# Patient Record
Sex: Female | Born: 1940 | Race: White | Hispanic: No | Smoking: Former smoker
Health system: Southern US, Community
[De-identification: ages and names within clinical notes are randomized; demographics above are authoritative.]

## PROBLEM LIST (undated history)

## (undated) DIAGNOSIS — E7800 Pure hypercholesterolemia, unspecified: Secondary | ICD-10-CM

## (undated) DIAGNOSIS — R011 Cardiac murmur, unspecified: Secondary | ICD-10-CM

## (undated) DIAGNOSIS — Z972 Presence of dental prosthetic device (complete) (partial): Secondary | ICD-10-CM

## (undated) DIAGNOSIS — F329 Major depressive disorder, single episode, unspecified: Secondary | ICD-10-CM

## (undated) DIAGNOSIS — I10 Essential (primary) hypertension: Secondary | ICD-10-CM

## (undated) DIAGNOSIS — F419 Anxiety disorder, unspecified: Secondary | ICD-10-CM

## (undated) DIAGNOSIS — C801 Malignant (primary) neoplasm, unspecified: Secondary | ICD-10-CM

## (undated) DIAGNOSIS — J189 Pneumonia, unspecified organism: Secondary | ICD-10-CM

## (undated) DIAGNOSIS — M109 Gout, unspecified: Secondary | ICD-10-CM

## (undated) DIAGNOSIS — E119 Type 2 diabetes mellitus without complications: Secondary | ICD-10-CM

## (undated) DIAGNOSIS — F32A Depression, unspecified: Secondary | ICD-10-CM

## (undated) DIAGNOSIS — S82899A Other fracture of unspecified lower leg, initial encounter for closed fracture: Secondary | ICD-10-CM

## (undated) DIAGNOSIS — K219 Gastro-esophageal reflux disease without esophagitis: Secondary | ICD-10-CM

## (undated) DIAGNOSIS — N3946 Mixed incontinence: Secondary | ICD-10-CM

## (undated) DIAGNOSIS — E079 Disorder of thyroid, unspecified: Secondary | ICD-10-CM

## (undated) DIAGNOSIS — M1990 Unspecified osteoarthritis, unspecified site: Secondary | ICD-10-CM

## (undated) HISTORY — DX: Depression, unspecified: F32.A

## (undated) HISTORY — DX: Anxiety disorder, unspecified: F41.9

## (undated) HISTORY — PX: EYE SURGERY: SHX253

## (undated) HISTORY — DX: Unspecified osteoarthritis, unspecified site: M19.90

## (undated) HISTORY — DX: Major depressive disorder, single episode, unspecified: F32.9

## (undated) HISTORY — DX: Mixed incontinence: N39.46

## (undated) HISTORY — DX: Other fracture of unspecified lower leg, initial encounter for closed fracture: S82.899A

## (undated) HISTORY — PX: CHOLECYSTECTOMY: SHX55

## (undated) HISTORY — DX: Cardiac murmur, unspecified: R01.1

## (undated) HISTORY — PX: ABDOMINAL HYSTERECTOMY: SHX81

## (undated) HISTORY — PX: KIDNEY SURGERY: SHX687

---

## 1991-12-28 HISTORY — PX: NEPHRECTOMY: SHX65

## 1991-12-28 HISTORY — PX: APPENDECTOMY: SHX54

## 2006-01-19 ENCOUNTER — Ambulatory Visit: Payer: Self-pay

## 2007-07-27 ENCOUNTER — Ambulatory Visit: Payer: Self-pay | Admitting: Family Medicine

## 2008-08-06 ENCOUNTER — Ambulatory Visit: Payer: Self-pay | Admitting: Family Medicine

## 2009-10-23 ENCOUNTER — Ambulatory Visit: Payer: Self-pay | Admitting: Family Medicine

## 2009-11-17 ENCOUNTER — Ambulatory Visit: Payer: Self-pay | Admitting: Family Medicine

## 2010-12-18 ENCOUNTER — Emergency Department: Payer: Self-pay | Admitting: Emergency Medicine

## 2011-02-10 ENCOUNTER — Ambulatory Visit: Payer: Self-pay | Admitting: Family Medicine

## 2012-01-29 ENCOUNTER — Emergency Department: Payer: Self-pay | Admitting: Unknown Physician Specialty

## 2012-01-29 LAB — COMPREHENSIVE METABOLIC PANEL
Anion Gap: 10 (ref 7–16)
BUN: 18 mg/dL (ref 7–18)
Calcium, Total: 8.3 mg/dL — ABNORMAL LOW (ref 8.5–10.1)
Chloride: 102 mmol/L (ref 98–107)
Co2: 32 mmol/L (ref 21–32)
EGFR (Non-African Amer.): 56 — ABNORMAL LOW
Potassium: 3.6 mmol/L (ref 3.5–5.1)
SGOT(AST): 17 U/L (ref 15–37)
SGPT (ALT): 21 U/L

## 2012-01-29 LAB — CBC
HGB: 14.3 g/dL (ref 12.0–16.0)
MCHC: 33 g/dL (ref 32.0–36.0)
MCV: 90 fL (ref 80–100)
Platelet: 248 10*3/uL (ref 150–440)
RBC: 4.81 10*6/uL (ref 3.80–5.20)
RDW: 13.7 % (ref 11.5–14.5)
WBC: 11.6 10*3/uL — ABNORMAL HIGH (ref 3.6–11.0)

## 2012-01-29 LAB — URIC ACID: Uric Acid: 8.1 mg/dL — ABNORMAL HIGH (ref 2.6–6.0)

## 2012-01-29 LAB — SEDIMENTATION RATE: Erythrocyte Sed Rate: 6 mm/hr (ref 0–30)

## 2012-03-21 ENCOUNTER — Ambulatory Visit: Payer: Self-pay | Admitting: Pediatrics

## 2012-05-25 ENCOUNTER — Emergency Department: Payer: Self-pay | Admitting: Emergency Medicine

## 2012-05-25 LAB — URINALYSIS, COMPLETE
Bilirubin,UR: NEGATIVE
Blood: NEGATIVE
Leukocyte Esterase: NEGATIVE
Nitrite: NEGATIVE
Ph: 5 (ref 4.5–8.0)
RBC,UR: 1 /HPF (ref 0–5)
Specific Gravity: 1.019 (ref 1.003–1.030)
Squamous Epithelial: 4

## 2013-02-18 ENCOUNTER — Ambulatory Visit: Payer: Self-pay | Admitting: Emergency Medicine

## 2013-04-30 ENCOUNTER — Ambulatory Visit: Payer: Self-pay

## 2013-07-02 DIAGNOSIS — I10 Essential (primary) hypertension: Secondary | ICD-10-CM | POA: Insufficient documentation

## 2013-07-02 DIAGNOSIS — I152 Hypertension secondary to endocrine disorders: Secondary | ICD-10-CM | POA: Insufficient documentation

## 2013-08-07 ENCOUNTER — Ambulatory Visit: Payer: Self-pay | Admitting: Family Medicine

## 2013-08-30 DIAGNOSIS — K5901 Slow transit constipation: Secondary | ICD-10-CM | POA: Insufficient documentation

## 2013-08-30 DIAGNOSIS — N816 Rectocele: Secondary | ICD-10-CM | POA: Insufficient documentation

## 2013-10-08 DIAGNOSIS — F3341 Major depressive disorder, recurrent, in partial remission: Secondary | ICD-10-CM | POA: Insufficient documentation

## 2013-12-14 ENCOUNTER — Ambulatory Visit: Payer: Self-pay | Admitting: Medical

## 2013-12-14 LAB — CBC WITH DIFFERENTIAL/PLATELET
Basophil %: 1.1 %
Eosinophil #: 0.2 10*3/uL (ref 0.0–0.7)
Eosinophil %: 2.5 %
HCT: 43.2 % (ref 35.0–47.0)
HGB: 14.3 g/dL (ref 12.0–16.0)
Lymphocyte %: 27 %
MCH: 29.5 pg (ref 26.0–34.0)
MCHC: 33.1 g/dL (ref 32.0–36.0)
MCV: 89 fL (ref 80–100)
Monocyte #: 0.5 x10 3/mm (ref 0.2–0.9)
Monocyte %: 6.8 %
Neutrophil #: 4.2 10*3/uL (ref 1.4–6.5)
Neutrophil %: 62.6 %
RBC: 4.84 10*6/uL (ref 3.80–5.20)
RDW: 14.2 % (ref 11.5–14.5)
WBC: 6.7 10*3/uL (ref 3.6–11.0)

## 2013-12-14 LAB — SEDIMENTATION RATE: Erythrocyte Sed Rate: 5 mm/hr (ref 0–30)

## 2014-02-11 ENCOUNTER — Emergency Department: Payer: Self-pay | Admitting: Emergency Medicine

## 2014-02-11 LAB — BASIC METABOLIC PANEL
ANION GAP: 8 (ref 7–16)
BUN: 16 mg/dL (ref 7–18)
CREATININE: 0.98 mg/dL (ref 0.60–1.30)
Calcium, Total: 8.8 mg/dL (ref 8.5–10.1)
Chloride: 99 mmol/L (ref 98–107)
Co2: 28 mmol/L (ref 21–32)
EGFR (Non-African Amer.): 58 — ABNORMAL LOW
Glucose: 162 mg/dL — ABNORMAL HIGH (ref 65–99)
Osmolality: 275 (ref 275–301)
Potassium: 3.3 mmol/L — ABNORMAL LOW (ref 3.5–5.1)
Sodium: 135 mmol/L — ABNORMAL LOW (ref 136–145)

## 2014-02-11 LAB — TROPONIN I: Troponin-I: <0.02 ng/mL

## 2014-02-11 LAB — CBC WITH DIFFERENTIAL/PLATELET
Basophil #: 0 10*3/uL (ref 0.0–0.1)
Basophil %: 0.4 %
EOS PCT: 0.1 %
Eosinophil #: 0 10*3/uL (ref 0.0–0.7)
HCT: 43 % (ref 35.0–47.0)
HGB: 14.8 g/dL (ref 12.0–16.0)
LYMPHS PCT: 7.2 %
Lymphocyte #: 0.7 10*3/uL — ABNORMAL LOW (ref 1.0–3.6)
MCH: 30.2 pg (ref 26.0–34.0)
MCHC: 34.4 g/dL (ref 32.0–36.0)
MCV: 88 fL (ref 80–100)
Monocyte #: 1 x10 3/mm — ABNORMAL HIGH (ref 0.2–0.9)
Monocyte %: 9.9 %
Neutrophil #: 8.6 10*3/uL — ABNORMAL HIGH (ref 1.4–6.5)
Neutrophil %: 82.4 %
Platelet: 191 10*3/uL (ref 150–440)
RBC: 4.9 10*6/uL (ref 3.80–5.20)
RDW: 14.7 % — AB (ref 11.5–14.5)
WBC: 10.4 10*3/uL (ref 3.6–11.0)

## 2014-02-16 LAB — CULTURE, BLOOD (SINGLE)

## 2014-04-02 DIAGNOSIS — E1142 Type 2 diabetes mellitus with diabetic polyneuropathy: Secondary | ICD-10-CM | POA: Insufficient documentation

## 2014-04-02 HISTORY — DX: Type 2 diabetes mellitus with diabetic polyneuropathy: E11.42

## 2014-04-12 ENCOUNTER — Ambulatory Visit: Payer: Self-pay | Admitting: Family Medicine

## 2014-04-24 ENCOUNTER — Ambulatory Visit: Payer: Self-pay | Admitting: Physician Assistant

## 2014-06-14 ENCOUNTER — Ambulatory Visit: Payer: Self-pay | Admitting: Physician Assistant

## 2014-09-20 DIAGNOSIS — R7303 Prediabetes: Secondary | ICD-10-CM | POA: Insufficient documentation

## 2014-09-20 DIAGNOSIS — E118 Type 2 diabetes mellitus with unspecified complications: Secondary | ICD-10-CM | POA: Insufficient documentation

## 2015-05-02 DIAGNOSIS — K219 Gastro-esophageal reflux disease without esophagitis: Secondary | ICD-10-CM | POA: Insufficient documentation

## 2015-05-02 DIAGNOSIS — Z8639 Personal history of other endocrine, nutritional and metabolic disease: Secondary | ICD-10-CM | POA: Insufficient documentation

## 2015-05-02 DIAGNOSIS — N3941 Urge incontinence: Secondary | ICD-10-CM | POA: Insufficient documentation

## 2015-05-02 DIAGNOSIS — Z8541 Personal history of malignant neoplasm of cervix uteri: Secondary | ICD-10-CM | POA: Insufficient documentation

## 2015-06-27 ENCOUNTER — Ambulatory Visit
Admission: EM | Admit: 2015-06-27 | Discharge: 2015-06-27 | Disposition: A | Discharge status: Home / Self Care | Payer: Medicare PPO | Attending: Internal Medicine | Admitting: Internal Medicine

## 2015-06-27 DIAGNOSIS — E1169 Type 2 diabetes mellitus with other specified complication: Secondary | ICD-10-CM

## 2015-06-27 DIAGNOSIS — L089 Local infection of the skin and subcutaneous tissue, unspecified: Secondary | ICD-10-CM | POA: Diagnosis not present

## 2015-06-27 DIAGNOSIS — E11628 Type 2 diabetes mellitus with other skin complications: Secondary | ICD-10-CM

## 2015-06-27 HISTORY — DX: Gastro-esophageal reflux disease without esophagitis: K21.9

## 2015-06-27 HISTORY — DX: Type 2 diabetes mellitus without complications: E11.9

## 2015-06-27 HISTORY — DX: Pure hypercholesterolemia, unspecified: E78.00

## 2015-06-27 HISTORY — DX: Disorder of thyroid, unspecified: E07.9

## 2015-06-27 HISTORY — DX: Essential (primary) hypertension: I10

## 2015-06-27 HISTORY — DX: Gout, unspecified: M10.9

## 2015-06-27 MED ORDER — SULFAMETHOXAZOLE-TRIMETHOPRIM 800-160 MG PO TABS: 1.0000 | ORAL_TABLET | Freq: Two times a day (BID) | ORAL | Status: AC

## 2015-06-27 NOTE — ED Notes (Signed)
Walking into a building that had a 1 foot drop, on July 22/16. Noted a small red area on top of left foot and since then has turned red with a small area of cellulitis. Denies pain.

## 2015-06-27 NOTE — Discharge Instructions (Signed)
Prescription for sulfamethoxazole/trimethoprim sent to the [facility] in [place]. Continue washing gently with soap/water 1-2 times daily,  then cover with a thin smear of antibiotic ointment/bandaid.   Recheck or followup with Dr Iona Beard in a week.  Cellulitis Cellulitis is an infection of the skin and the tissue under the skin. The infected area is usually red and tender. This happens most often in the arms and lower legs. HOME CARE   Take your antibiotic medicine as told. Finish the medicine even if you start to feel better.  Keep the infected arm or leg raised (elevated).  Put a warm cloth on the area up to 4 times per day.  Only take medicines as told by your doctor.  Keep all doctor visits as told. GET HELP IF:  You see red streaks on the skin coming from the infected area.  Your red area gets bigger or turns a dark color.  Your bone or joint under the infected area is painful after the skin heals.  Your infection comes back in the same area or different area.  You have a puffy (swollen) bump in the infected area.  You have new symptoms.  You have a fever. GET HELP RIGHT AWAY IF:   You feel very sleepy.  You throw up (vomit) or have watery poop (diarrhea).  You feel sick and have muscle aches and pains. MAKE SURE YOU:   Understand these instructions.  Will watch your condition.  Will get help right away if you are not doing well or get worse. Document Released: 05/31/2008 Document Revised: 04/29/2014 Document Reviewed: 02/28/2012 [facility] Patient Information 2015 [place], [place]. This information is not intended to replace advice given to you by your health care provider. Make sure you discuss any questions you have with your health care provider.

## 2015-06-27 NOTE — ED Provider Notes (Addendum)
CSN: [id number]     Arrival date & time 06/27/15  0749 History   First MD Initiated Contact with Patient 06/27/15 0831     Chief Complaint  Patient presents with  . Foot Injury   HPI  Patient is a 74 year old lady with past medical history notable for diabetes for about the last 7 years. About a week ago, she misjudged a step going into a building, and twisted her left foot. Her foot was in a tennis shoe, but the dorsal surface was slightly abraded. She has been washing it and applying Neosporin ointment, but noticed today that there was increasing redness around the scraped area. She has had no difficulty with ambulation. No other injuries reported. No fever, no malaise.  Past Medical History  Diagnosis Date  . Diabetes mellitus without complication   . Gout   . GERD (gastroesophageal reflux disease)   . Hypertension   . Hypercholesteremia   . Thyroid condition    Past Surgical History  Procedure Laterality Date  . Cholecystectomy    . Abdominal hysterectomy     Family History  Problem Relation Age of Onset  . Heart disease Mother   . Heart disease Father   . Diabetes Father   . Hypertension Father   . Cancer Brother    History  Substance Use Topics  . Smoking status: Former Research scientist (life sciences)  . Smokeless tobacco: Not on file  . Alcohol Use: No   OB History    No data available     Review of Systems  All other systems reviewed and are negative.   Allergies  Review of patient's allergies indicates no known allergies.  Home Medications   Prior to Admission medications   Medication Sig Start Date End Date Taking? Authorizing Provider  allopurinol (ZYLOPRIM) 300 MG tablet Take 300 mg by mouth daily.   Yes Historical Provider, MD  aspirin EC 81 MG tablet Take 81 mg by mouth daily.   Yes Historical Provider, MD  calcium-vitamin D (OSCAL WITH D) 500-200 MG-UNIT per tablet Take 1 tablet by mouth.   Yes Historical Provider, MD  glimepiride (AMARYL) 2 MG tablet Take 2 mg by mouth  daily with breakfast.   Yes Historical Provider, MD  metFORMIN (GLUCOPHAGE) 500 MG tablet Take by mouth 2 (two) times daily with a meal.   Yes Historical Provider, MD  omeprazole (PRILOSEC) 40 MG capsule Take 40 mg by mouth daily.   Yes Historical Provider, MD  simvastatin (ZOCOR) 40 MG tablet Take 40 mg by mouth daily.   Yes Historical Provider, MD  traZODone (DESYREL) 50 MG tablet Take 50 mg by mouth at bedtime.   Yes Historical Provider, MD  triamterene-hydrochlorothiazide (MAXZIDE-25) 37.5-25 MG per tablet Take 1 tablet by mouth daily.   Yes Historical Provider, MD          BP 178/65 mmHg  Pulse 73  Temp(Src) 97.7 F (36.5 C) (Tympanic)  Resp 16  Ht 5' (1.524 m)  Wt 125 lb (56.7 kg)  BMI 24.41 kg/m2  SpO2 96% Physical Exam  Constitutional: She is oriented to person, place, and time. No distress.  Alert, nicely groomed  HENT:  Head: Atraumatic.  Eyes:  Conjugate gaze, no eye redness/drainage  Neck: Neck supple.  Cardiovascular: Normal rate.   Pulmonary/Chest: No respiratory distress.  Abdominal: She exhibits no distension.  Musculoskeletal: Normal range of motion.  No leg swelling  Neurological: She is alert and oriented to person, place, and time.  Skin: Skin is warm and  dry.  No cyanosis Dorsal left foot has a 1.5 inch focal zone of deep erythema, slight puffiness of the entire dorsum of the foot. There is a 0.75 inch soft scab centrally. No fluctuance, not able to express any material from the wound. Mildly tender.  Nursing note and vitals reviewed.   ED Course  Procedures  No procedure at the urgent care today  MDM   1. Diabetic infection of left foot    Continue washing the wound gently with soap and water once or twice a day and applying antibiotic ointment and a Band-Aid. Scription for Bactrim sent to the pharmacy; in a couple of days if there is not improvement in the redness and puffiness of the foot would add Keflex or something similar for strep coverage.  Anticipate gradual improvement over the next couple weeks, beginning within the next 48 hours. Recheck for increasing redness/swelling/pain/drainage, or new fever greater than 100.5. Have wound checked in a week or so, either at the urgent care or with PCP Dr. Iona Beard.    Sherlene Shams, MD 06/27/15 [place], MD 06/27/15 [phone]

## 2015-09-23 DIAGNOSIS — R809 Proteinuria, unspecified: Secondary | ICD-10-CM | POA: Insufficient documentation

## 2016-01-15 DIAGNOSIS — I10 Essential (primary) hypertension: Secondary | ICD-10-CM | POA: Diagnosis not present

## 2016-01-15 DIAGNOSIS — E559 Vitamin D deficiency, unspecified: Secondary | ICD-10-CM | POA: Insufficient documentation

## 2016-01-15 DIAGNOSIS — E782 Mixed hyperlipidemia: Secondary | ICD-10-CM | POA: Diagnosis not present

## 2016-01-15 DIAGNOSIS — F5101 Primary insomnia: Secondary | ICD-10-CM | POA: Insufficient documentation

## 2016-01-15 DIAGNOSIS — K219 Gastro-esophageal reflux disease without esophagitis: Secondary | ICD-10-CM | POA: Diagnosis not present

## 2016-01-15 DIAGNOSIS — Z79899 Other long term (current) drug therapy: Secondary | ICD-10-CM | POA: Insufficient documentation

## 2016-01-15 DIAGNOSIS — E1142 Type 2 diabetes mellitus with diabetic polyneuropathy: Secondary | ICD-10-CM | POA: Diagnosis not present

## 2016-01-15 DIAGNOSIS — M1A9XX0 Chronic gout, unspecified, without tophus (tophi): Secondary | ICD-10-CM | POA: Diagnosis not present

## 2016-01-15 DIAGNOSIS — N3941 Urge incontinence: Secondary | ICD-10-CM | POA: Diagnosis not present

## 2016-02-03 DIAGNOSIS — E538 Deficiency of other specified B group vitamins: Secondary | ICD-10-CM | POA: Insufficient documentation

## 2016-02-25 DIAGNOSIS — E119 Type 2 diabetes mellitus without complications: Secondary | ICD-10-CM | POA: Diagnosis not present

## 2016-03-15 ENCOUNTER — Other Ambulatory Visit: Payer: Self-pay | Admitting: Internal Medicine

## 2016-03-15 DIAGNOSIS — E1142 Type 2 diabetes mellitus with diabetic polyneuropathy: Secondary | ICD-10-CM | POA: Diagnosis not present

## 2016-03-15 DIAGNOSIS — Z1239 Encounter for other screening for malignant neoplasm of breast: Secondary | ICD-10-CM | POA: Diagnosis not present

## 2016-03-15 DIAGNOSIS — M1A00X0 Idiopathic chronic gout, unspecified site, without tophus (tophi): Secondary | ICD-10-CM | POA: Diagnosis not present

## 2016-03-15 DIAGNOSIS — G63 Polyneuropathy in diseases classified elsewhere: Secondary | ICD-10-CM | POA: Diagnosis not present

## 2016-03-15 DIAGNOSIS — I10 Essential (primary) hypertension: Secondary | ICD-10-CM | POA: Diagnosis not present

## 2016-03-15 DIAGNOSIS — Z1231 Encounter for screening mammogram for malignant neoplasm of breast: Secondary | ICD-10-CM | POA: Diagnosis not present

## 2016-03-15 DIAGNOSIS — E559 Vitamin D deficiency, unspecified: Secondary | ICD-10-CM | POA: Diagnosis not present

## 2016-03-15 DIAGNOSIS — E538 Deficiency of other specified B group vitamins: Secondary | ICD-10-CM | POA: Diagnosis not present

## 2016-03-15 DIAGNOSIS — N3941 Urge incontinence: Secondary | ICD-10-CM | POA: Diagnosis not present

## 2016-03-15 DIAGNOSIS — F5101 Primary insomnia: Secondary | ICD-10-CM | POA: Diagnosis not present

## 2016-03-15 DIAGNOSIS — R270 Ataxia, unspecified: Secondary | ICD-10-CM | POA: Diagnosis not present

## 2016-03-15 DIAGNOSIS — E782 Mixed hyperlipidemia: Secondary | ICD-10-CM | POA: Diagnosis not present

## 2016-03-16 ENCOUNTER — Ambulatory Visit
Admission: RE | Admit: 2016-03-16 | Discharge: 2016-03-16 | Disposition: A | Discharge status: Home / Self Care | Payer: PPO | Source: Ambulatory Visit | Attending: Internal Medicine | Admitting: Internal Medicine

## 2016-03-16 DIAGNOSIS — Z1231 Encounter for screening mammogram for malignant neoplasm of breast: Secondary | ICD-10-CM | POA: Diagnosis not present

## 2016-03-16 HISTORY — DX: Malignant (primary) neoplasm, unspecified: C80.1

## 2016-03-23 ENCOUNTER — Ambulatory Visit (INDEPENDENT_AMBULATORY_CARE_PROVIDER_SITE_OTHER): Payer: PPO | Admitting: Urology

## 2016-03-23 ENCOUNTER — Encounter: Payer: Self-pay | Admitting: Urology

## 2016-03-23 VITALS — BP 168/71 | HR 64 | Ht 60.0 in | Wt 134.7 lb

## 2016-03-23 DIAGNOSIS — R32 Unspecified urinary incontinence: Secondary | ICD-10-CM

## 2016-03-23 DIAGNOSIS — N3946 Mixed incontinence: Secondary | ICD-10-CM

## 2016-03-23 LAB — URINALYSIS, COMPLETE
BILIRUBIN UA: NEGATIVE
Glucose, UA: NEGATIVE
Ketones, UA: NEGATIVE
LEUKOCYTES UA: NEGATIVE
Nitrite, UA: POSITIVE — AB
PH UA: 6 (ref 5.0–7.5)
PROTEIN UA: NEGATIVE
RBC, UA: NEGATIVE
Specific Gravity, UA: 1.025 (ref 1.005–1.030)
Urobilinogen, Ur: 1 mg/dL (ref 0.2–1.0)

## 2016-03-23 LAB — MICROSCOPIC EXAMINATION: RBC MICROSCOPIC, UA: NONE SEEN /HPF (ref 0–?)

## 2016-03-23 LAB — BLADDER SCAN AMB NON-IMAGING: Scan Result: 0

## 2016-03-23 NOTE — Progress Notes (Signed)
03/23/2016 12:22 PM   Candice Cook 06-Apr-1941 [id number]  Referring provider: Sharyne Peach, MD [name], [name] [address]  Chief Complaint  Patient presents with  . Urinary Incontinence    referred by Dorthula Perfect    HPI: Patient is a 75 year old Caucasian female who presents today at the request of her primary care physician, Dr. Dorthula Perfect,  for urinary incontinence   Patient was given a trial of Myrbetriq 25 mg daily. She is on the medication for 2 weeks.  She states that she noted significant improvement in her incontinence, but then she started experiencing uncontrollable tremors and had to discontinue the medication.  She states she has been suffering with incontinence for longer than 1 year.  She endorses symptoms of both stress urinary incontinence and urge incontinence.   She wears 3 incontinence pads daily. She states she has a variable leak volume.  He also experiences nocturia 1, but she does not suffer from nighttime incontinence.  She does not suffer with constipation, but she states that she has stools more on the loose side.    She denies any gross hematuria or history of urinary tract infections.  Her UA today was nitrate positive and had many bacteria.  Her PVR was 0 mL on today's exam.     PMH: Past Medical History  Diagnosis Date  . Diabetes mellitus without complication ([place])   . Gout   . GERD (gastroesophageal reflux disease)   . Hypertension   . Hypercholesteremia   . Thyroid condition   . Cancer ([place])     skin ca  . Anxiety and depression   . Arthritis   . Heart murmur     Surgical History: Past Surgical History  Procedure Laterality Date  . Cholecystectomy    . Abdominal hysterectomy    . Kidney surgery      gave daughter a kidney    Home Medications:    Medication List       This list is accurate as of: 03/23/16 12:22 PM.  Always use your most recent med list.               alendronate 70 MG tablet  Commonly known  as:  FOSAMAX  Take 70 mg by mouth. Reported on 03/23/2016     allopurinol 300 MG tablet  Commonly known as:  ZYLOPRIM  Take 300 mg by mouth daily.     aspirin EC 81 MG tablet  Take 81 mg by mouth daily.     calcium-vitamin D 500-200 MG-UNIT tablet  Commonly known as:  OSCAL WITH D  Take 1 tablet by mouth.     FIFTY50 GLUCOSE METER 2.0 w/Device Kit     gabapentin 300 MG capsule  Commonly known as:  NEURONTIN  Take by mouth.     glimepiride 2 MG tablet  Commonly known as:  AMARYL  Take 2 mg by mouth daily with breakfast.     indomethacin 25 MG capsule  Commonly known as:  INDOCIN  Take 25 mg by mouth. Reported on 03/23/2016     levothyroxine 75 MCG tablet  Commonly known as:  SYNTHROID, LEVOTHROID  Take by mouth. Reported on 03/23/2016     lisinopril-hydrochlorothiazide 20-12.5 MG tablet  Commonly known as:  PRINZIDE,ZESTORETIC  Take by mouth.     metFORMIN 500 MG tablet  Commonly known as:  GLUCOPHAGE  Take by mouth 2 (two) times daily with a meal. Reported on 03/23/2016  MYRBETRIQ 25 MG Tb24 tablet  Generic drug:  mirabegron ER  Reported on 03/23/2016     omeprazole 40 MG capsule  Commonly known as:  PRILOSEC  Take 40 mg by mouth daily.     ONE TOUCH ULTRA TEST test strip  Generic drug:  glucose blood  USE ONCE DAILY. AS INSTRUCTED.     onetouch ultrasoft lancets  USE 1 EACH ONCE DAILY. USE AS INSTRUCTED. ONE TOUCH LANCETS     PREMARIN vaginal cream  Generic drug:  conjugated estrogens     sertraline 50 MG tablet  Commonly known as:  ZOLOFT  Take 50 mg by mouth.     simvastatin 40 MG tablet  Commonly known as:  ZOCOR  Take 40 mg by mouth daily.     traZODone 50 MG tablet  Commonly known as:  DESYREL  Take 100 mg by mouth at bedtime.     triamterene-hydrochlorothiazide 37.5-25 MG tablet  Commonly known as:  MAXZIDE-25  Take 1 tablet by mouth daily. Reported on 03/23/2016        Allergies:  Allergies  Allergen Reactions  . Mirabegron Other  (See Comments)    Severe tremor, headache  . Metformin Diarrhea    Family History: Family History  Problem Relation Age of Onset  . Heart disease Mother   . Heart disease Father   . Diabetes Father   . Hypertension Father   . Cancer Brother   . Kidney disease Daughter   . Bladder Cancer Neg Hx     Social History:  reports that she has quit smoking. She does not have any smokeless tobacco history on file. She reports that she does not drink alcohol or use illicit drugs.  ROS: UROLOGY Frequent Urination?: Yes Hard to postpone urination?: Yes Burning/pain with urination?: No Get up at night to urinate?: Yes Leakage of urine?: Yes Urine stream starts and stops?: No Trouble starting stream?: No Do you have to strain to urinate?: No Blood in urine?: No Urinary tract infection?: No Sexually transmitted disease?: No Injury to kidneys or bladder?: No Painful intercourse?: No Weak stream?: No Currently pregnant?: No Vaginal bleeding?: No Last menstrual period?: n  Gastrointestinal Nausea?: No Vomiting?: No Indigestion/heartburn?: No Diarrhea?: No Constipation?: No  Constitutional Fever: No Night sweats?: No Weight loss?: No Fatigue?: No  Skin Skin rash/lesions?: No Itching?: No  Eyes Blurred vision?: No Double vision?: No  Ears/Nose/Throat Sore throat?: No Sinus problems?: No  Hematologic/Lymphatic Swollen glands?: No Easy bruising?: Yes  Cardiovascular Leg swelling?: No Chest pain?: No  Respiratory Cough?: No Shortness of breath?: No  Endocrine Excessive thirst?: Yes  Musculoskeletal Back pain?: No Joint pain?: Yes  Neurological Headaches?: No Dizziness?: No  Psychologic Depression?: Yes Anxiety?: Yes  Physical Exam: BP 168/71 mmHg  Pulse 64  Ht 5' (1.524 m)  Wt 134 lb 11.2 oz (61.1 kg)  BMI 26.31 kg/m2  Constitutional: Well nourished. Alert and oriented, No acute distress. HEENT: [place] AT, moist mucus membranes. Trachea midline,  no masses. Cardiovascular: No clubbing, cyanosis, or edema. Respiratory: Normal respiratory effort, no increased work of breathing. GI: Abdomen is soft, non tender, non distended, no abdominal masses. Liver and spleen not palpable.  No hernias appreciated.  Stool sample for occult testing is not indicated.   GU: No CVA tenderness.  No bladder fullness or masses.   Skin: No rashes, bruises or suspicious lesions. Lymph: No cervical or inguinal adenopathy. Neurologic: Grossly intact, no focal deficits, moving all 4 extremities. Psychiatric: Normal mood and  affect.  Laboratory Data: Lab Results  Component Value Date   WBC 10.4 02/11/2014   HGB 14.8 02/11/2014   HCT 43.0 02/11/2014   MCV 88 02/11/2014   PLT 191 02/11/2014    Lab Results  Component Value Date   CREATININE 0.98 02/11/2014    Lab Results  Component Value Date   AST 17 01/29/2012   Lab Results  Component Value Date   ALT 21 01/29/2012     Urinalysis Results for orders placed or performed in visit on 03/23/16  Microscopic Examination  Result Value Ref Range   WBC, UA 0-5 0 -  5 /hpf   RBC, UA None seen 0 -  2 /hpf   Epithelial Cells (non renal) 0-10 0 - 10 /hpf   Bacteria, UA Many (A) None seen/Few  Urinalysis, Complete  Result Value Ref Range   Specific Gravity, UA 1.025 1.005 - 1.030   pH, UA 6.0 5.0 - 7.5   Color, UA Yellow Yellow   Appearance Ur Clear Clear   Leukocytes, UA Negative Negative   Protein, UA Negative Negative/Trace   Glucose, UA Negative Negative   Ketones, UA Negative Negative   RBC, UA Negative Negative   Bilirubin, UA Negative Negative   Urobilinogen, Ur 1.0 0.2 - 1.0 mg/dL   Nitrite, UA Positive (A) Negative   Microscopic Examination See below:   BLADDER SCAN AMB NON-IMAGING  Result Value Ref Range   Scan Result 0     Pertinent Imaging: Results for UGOCHI, HENZLER (MRN [id number]) as of 03/23/2016 12:17  Ref. Range 03/23/2016 09:12  Scan Result Unknown 0    Assessment &  Plan:    1. Mixed urinary incontinence:   Patient has symptoms of both stress and urge incontinence.  She did respond well to Myrbetriq, but she experienced severe side effects and had to stop the medication.   We discussed trying an anticholinergic or PTNS therapy to see if she could have reduction in her urinary incontinence.  She would like to try another medication.  I have given her Vesicare 10 mg daily.   I have advised her of the side effects of Vesicare, such as: Dry eyes, dry mouth, constipation, mental confusion and/or urinary retention.  She will RTC in one month for PVR and symptoms recheck.  - Urinalysis, Complete - BLADDER SCAN AMB NON-IMAGING   Return in about 1 month (around 04/23/2016) for PVR and UA.  These notes generated with voice recognition software. I apologize for typographical errors.  Zara Council, [facility] [address] [place], [place] [address] [phone]

## 2016-03-26 ENCOUNTER — Telehealth: Payer: Self-pay

## 2016-03-26 DIAGNOSIS — N390 Urinary tract infection, site not specified: Secondary | ICD-10-CM

## 2016-03-26 LAB — CULTURE, URINE COMPREHENSIVE

## 2016-03-26 MED ORDER — AMOXICILLIN-POT CLAVULANATE 875-125 MG PO TABS: 1.0000 | ORAL_TABLET | Freq: Two times a day (BID) | ORAL | Status: AC

## 2016-03-26 NOTE — Telephone Encounter (Signed)
Spoke with pt in reference to UTI. Made aware medication was sent to pharmacy. Pt voiced understanding.

## 2016-03-26 NOTE — Telephone Encounter (Signed)
-----   Message from Nori Riis, PA-C sent at 03/26/2016  8:24 AM EDT ----- Patient has a +UCx.  They need to start Augmentin 875/125 one tablet twice daily for seven days.  She is returning for an appointment and we will recheck the urine then.

## 2016-04-15 DIAGNOSIS — Z79899 Other long term (current) drug therapy: Secondary | ICD-10-CM | POA: Diagnosis not present

## 2016-04-15 DIAGNOSIS — I10 Essential (primary) hypertension: Secondary | ICD-10-CM | POA: Diagnosis not present

## 2016-04-15 DIAGNOSIS — E559 Vitamin D deficiency, unspecified: Secondary | ICD-10-CM | POA: Diagnosis not present

## 2016-04-15 DIAGNOSIS — E782 Mixed hyperlipidemia: Secondary | ICD-10-CM | POA: Diagnosis not present

## 2016-04-15 DIAGNOSIS — E538 Deficiency of other specified B group vitamins: Secondary | ICD-10-CM | POA: Diagnosis not present

## 2016-04-15 DIAGNOSIS — M1A00X0 Idiopathic chronic gout, unspecified site, without tophus (tophi): Secondary | ICD-10-CM | POA: Diagnosis not present

## 2016-04-15 DIAGNOSIS — E1142 Type 2 diabetes mellitus with diabetic polyneuropathy: Secondary | ICD-10-CM | POA: Diagnosis not present

## 2016-04-23 ENCOUNTER — Encounter: Payer: Self-pay | Admitting: Urology

## 2016-04-23 ENCOUNTER — Ambulatory Visit (INDEPENDENT_AMBULATORY_CARE_PROVIDER_SITE_OTHER): Payer: PPO | Admitting: Urology

## 2016-04-23 VITALS — BP 127/61 | HR 72 | Ht 61.0 in | Wt 130.5 lb

## 2016-04-23 DIAGNOSIS — N3946 Mixed incontinence: Secondary | ICD-10-CM | POA: Diagnosis not present

## 2016-04-23 LAB — BLADDER SCAN AMB NON-IMAGING: Scan Result: 51

## 2016-04-23 NOTE — Progress Notes (Addendum)
10:24 AM   Candice Cook December 07, 1941 [id number]  Referring provider: Ezequiel Kayser, MD [facility] [place], [place] [address]  Chief Complaint  Patient presents with  . Mixed Incontinnece    1 month follow up    HPI: Patient is a 75 year old Caucasian female who presents today after a 1 month trial of Vesicare 10 mg daily.  Background history Patient presented to [place] at the request of her primary care physician, Dr. Dorthula Perfect,  for urinary incontinence.  Patient was given a trial of Myrbetriq 25 mg daily. She is on the medication for 2 weeks.  She states that she noted significant improvement in her incontinence, but then she started experiencing uncontrollable tremors and had to discontinue the medication.  She states she has been suffering with incontinence for longer than 1 year.  She endorses symptoms of both stress urinary incontinence and urge incontinence.   She wears 3 incontinence pads daily. She states she has a variable leak volume.  She also experiences nocturia 1, but she does not suffer from nighttime incontinence.  She does not suffer with constipation, but she states that she has stools more on the loose side.  She denies any gross hematuria or history of urinary tract infections.  She was found to have a urinary tract infection at her last visit.  It was positive for Escherichia coli and was sensitive to Augmentin. She completed a 7 day course of Augmentin.    She did not note any improvement with the incontinence after completing the antibiotic or with the Vesicare.  Her PVR today is 51 mL.    She is not experiencing dysuria, gross hematuria or suprapubic pain. She denies fevers, chills, nausea or vomiting.     PMH: Past Medical History  Diagnosis Date  . Diabetes mellitus without complication ([place])   . Gout   . GERD (gastroesophageal reflux disease)   . Hypertension   . Hypercholesteremia   . Thyroid condition   . Cancer ([place])    skin ca  . Anxiety and depression   . Arthritis   . Heart murmur   . Mixed incontinence     Surgical History: Past Surgical History  Procedure Laterality Date  . Cholecystectomy    . Abdominal hysterectomy    . Kidney surgery      gave daughter a kidney    Home Medications:    Medication List       This list is accurate as of: 04/23/16 10:24 AM.  Always use your most recent med list.               alendronate 70 MG tablet  Commonly known as:  FOSAMAX  Take 70 mg by mouth. Reported on 04/23/2016     allopurinol 300 MG tablet  Commonly known as:  ZYLOPRIM  Take 300 mg by mouth daily.     aspirin EC 81 MG tablet  Take 81 mg by mouth daily.     calcium-vitamin D 500-200 MG-UNIT tablet  Commonly known as:  OSCAL WITH D  Take 1 tablet by mouth. Reported on 04/23/2016     FIFTY50 GLUCOSE METER 2.0 w/Device Kit     gabapentin 300 MG capsule  Commonly known as:  NEURONTIN  Take by mouth.     glimepiride 2 MG tablet  Commonly known as:  AMARYL  Take 2 mg by mouth daily with breakfast.     indomethacin 25 MG capsule  Commonly known  as:  INDOCIN  Take 25 mg by mouth. Reported on 04/23/2016     levothyroxine 75 MCG tablet  Commonly known as:  SYNTHROID, LEVOTHROID  Take by mouth. Reported on 04/23/2016     lisinopril-hydrochlorothiazide 20-12.5 MG tablet  Commonly known as:  PRINZIDE,ZESTORETIC  Take by mouth.     metFORMIN 500 MG tablet  Commonly known as:  GLUCOPHAGE  Take by mouth 2 (two) times daily with a meal. Reported on 04/23/2016     MYRBETRIQ 25 MG Tb24 tablet  Generic drug:  mirabegron ER  Reported on 04/23/2016     omeprazole 40 MG capsule  Commonly known as:  PRILOSEC  Take 40 mg by mouth daily.     ONE TOUCH ULTRA TEST test strip  Generic drug:  glucose blood  USE ONCE DAILY. AS INSTRUCTED.     onetouch ultrasoft lancets  USE 1 EACH ONCE DAILY. USE AS INSTRUCTED. ONE TOUCH LANCETS     PREMARIN vaginal cream  Generic drug:  conjugated  estrogens     sertraline 50 MG tablet  Commonly known as:  ZOLOFT  Take 50 mg by mouth.     simvastatin 40 MG tablet  Commonly known as:  ZOCOR  Take 40 mg by mouth daily.     solifenacin 10 MG tablet  Commonly known as:  VESICARE  Take by mouth.     traZODone 50 MG tablet  Commonly known as:  DESYREL  Take 100 mg by mouth at bedtime.     triamterene-hydrochlorothiazide 37.5-25 MG tablet  Commonly known as:  MAXZIDE-25  Take 1 tablet by mouth daily. Reported on 04/23/2016     VITAMIN D-1000 MAX ST 1000 units tablet  Generic drug:  Cholecalciferol  Take by mouth.        Allergies:  Allergies  Allergen Reactions  . Mirabegron Other (See Comments)    Severe tremor, headache  . Metformin Diarrhea    Family History: Family History  Problem Relation Age of Onset  . Heart disease Mother   . Heart disease Father   . Diabetes Father   . Hypertension Father   . Cancer Brother   . Kidney disease Daughter   . Bladder Cancer Neg Hx     Social History:  reports that she has quit smoking. She does not have any smokeless tobacco history on file. She reports that she does not drink alcohol or use illicit drugs.  ROS: UROLOGY Frequent Urination?: Yes Hard to postpone urination?: Yes Burning/pain with urination?: No Get up at night to urinate?: Yes Leakage of urine?: Yes Urine stream starts and stops?: No Trouble starting stream?: No Do you have to strain to urinate?: No Blood in urine?: No Urinary tract infection?: No Sexually transmitted disease?: No Injury to kidneys or bladder?: No Painful intercourse?: No Weak stream?: No Currently pregnant?: No Vaginal bleeding?: No Last menstrual period?: n  Gastrointestinal Nausea?: No Vomiting?: No Indigestion/heartburn?: No Diarrhea?: No Constipation?: No  Constitutional Fever: No Night sweats?: No Weight loss?: No Fatigue?: No  Skin Skin rash/lesions?: No Itching?: No  Eyes Blurred vision?: No Double  vision?: No  Ears/Nose/Throat Sore throat?: No Sinus problems?: No  Hematologic/Lymphatic Swollen glands?: No Easy bruising?: Yes  Cardiovascular Leg swelling?: No Chest pain?: No  Respiratory Cough?: No Shortness of breath?: No  Endocrine Excessive thirst?: Yes  Musculoskeletal Back pain?: No Joint pain?: Yes  Neurological Headaches?: No Dizziness?: No  Psychologic Depression?: Yes Anxiety?: Yes  Physical Exam: BP 127/61 mmHg  Pulse 72  Ht 5' 1" (1.549 m)  Wt 130 lb 8 oz (59.194 kg)  BMI 24.67 kg/m2  Constitutional: Well nourished. Alert and oriented, No acute distress. HEENT: [place] AT, moist mucus membranes. Trachea midline, no masses. Cardiovascular: No clubbing, cyanosis, or edema. Respiratory: Normal respiratory effort, no increased work of breathing. GI: Abdomen is soft, non tender, non distended, no abdominal masses.  GU: No CVA tenderness.  No bladder fullness or masses.   Skin: No rashes, bruises or suspicious lesions. Lymph: No cervical or inguinal adenopathy. Neurologic: Grossly intact, no focal deficits, moving all 4 extremities. Psychiatric: Normal mood and affect.  Laboratory Data: Lab Results  Component Value Date   WBC 10.4 02/11/2014   HGB 14.8 02/11/2014   HCT 43.0 02/11/2014   MCV 88 02/11/2014   PLT 191 02/11/2014    Lab Results  Component Value Date   CREATININE 0.98 02/11/2014    Lab Results  Component Value Date   AST 17 01/29/2012   Lab Results  Component Value Date   ALT 21 01/29/2012    Pertinent Imaging: Results for Candice Cook, Candice Cook (MRN [id number]) as of 04/29/2016 10:06  Ref. Range 04/23/2016 10:09  Scan Result Unknown 51     Assessment & Plan:    1. Mixed urinary incontinence:   Patient has symptoms of both stress and urge incontinence.  She did respond well to Myrbetriq, but she experienced severe side effects and had to stop the medication.   She did not find the Vesicare effective.   We discussed trying an  anticholinergic or PTNS therapy to see if she could have reduction in her urinary incontinence.  She would like to try another medication.  I have given her Toviaz 31m daily.   I have advised her of the side effects of Toviaz, such as: Dry eyes, dry mouth, constipation, mental confusion and/or urinary retention.  I also gave her the brochure on PTNS.  She will [place] the TL[place]beth Ply[place] effective and she desires a prescription.  She is interested in PTNS, but she would like to wait to schedule the treatments in the winter.    - Urinalysis, Complete - BLADDER SCAN AMB NON-IMAGING   Return in about 1 year (around 04/23/2017) for office visit.  These notes generated with voice recognition software. I apologize for typographical errors.  SZara Council [facility] [address] [place] [phone]

## 2016-05-17 ENCOUNTER — Ambulatory Visit (INDEPENDENT_AMBULATORY_CARE_PROVIDER_SITE_OTHER): Payer: PPO | Admitting: Urology

## 2016-05-17 ENCOUNTER — Encounter: Payer: Self-pay | Admitting: Urology

## 2016-05-17 VITALS — BP 136/66 | HR 85 | Ht 60.0 in | Wt 130.7 lb

## 2016-05-17 DIAGNOSIS — N3946 Mixed incontinence: Secondary | ICD-10-CM | POA: Diagnosis not present

## 2016-05-17 LAB — BLADDER SCAN AMB NON-IMAGING: Scan Result: 0

## 2016-05-17 NOTE — Progress Notes (Signed)
10:43 AM   Candice Cook 1941-11-09 [id number]  Referring provider: Ezequiel Kayser, MD [facility] [place], [place] [address]  Chief Complaint  Patient presents with  . Mixed Incontinence    3 weeks follow up    HPI: Patient is a 75 year old Caucasian female who presents today after a 3 week trial of Toviaz 4 mg daily  Background history Patient presented to [place] at the request of her primary care physician, Dr. Dorthula Perfect,  for urinary incontinence.  Patient was given a trial of Myrbetriq 25 mg daily. She is on the medication for 2 weeks.  She states that she noted significant improvement in her incontinence, but then she started experiencing uncontrollable tremors and had to discontinue the medication.  She states she has been suffering with incontinence for longer than 1 year.  She endorses symptoms of both stress urinary incontinence and urge incontinence.   She wears 3 incontinence pads daily. She states she has a variable leak volume.  She also experiences nocturia 1, but she does not suffer from nighttime incontinence.  She does not suffer with constipation, but she states that she has stools more on the loose side.  She denies any gross hematuria or history of urinary tract infections.  She was found to have a urinary tract infection on 03/23/2016.  It was positive for Escherichia coli and was sensitive to Augmentin.  She completed a 7 day course of Augmentin.  She did not note any improvement with the incontinence after completing the antibiotic or with the Vesicare.  Her PVR today was 51 mL.    After a trial of the Toviaz 4 mg daily, she had extreme dry mouth and discontinued the samples. She did not note any improvement in her urinary symptoms. Her PVR was 0 mL.   She is experiencing urgency and leakage of urine.  She is not experiencing dysuria, gross hematuria or suprapubic pain. She denies fevers, chills, nausea or vomiting.     PMH: Past Medical History   Diagnosis Date  . Diabetes mellitus without complication ([place])   . Gout   . GERD (gastroesophageal reflux disease)   . Hypertension   . Hypercholesteremia   . Thyroid condition   . Cancer ([place])     skin ca  . Anxiety and depression   . Arthritis   . Heart murmur   . Mixed incontinence     Surgical History: Past Surgical History  Procedure Laterality Date  . Cholecystectomy    . Abdominal hysterectomy    . Kidney surgery      gave daughter a kidney    Home Medications:    Medication List       This list is accurate as of: 05/17/16 10:43 AM.  Always use your most recent med list.               alendronate 70 MG tablet  Commonly known as:  FOSAMAX  Take 70 mg by mouth. Reported on 05/17/2016     allopurinol 300 MG tablet  Commonly known as:  ZYLOPRIM  Take 300 mg by mouth daily.     aspirin EC 81 MG tablet  Take 81 mg by mouth daily.     calcium-vitamin D 500-200 MG-UNIT tablet  Commonly known as:  OSCAL WITH D  Take 1 tablet by mouth. Reported on 05/17/2016     FIFTY50 GLUCOSE METER 2.0 w/Device Kit     gabapentin 300 MG capsule  Commonly known as:  NEURONTIN  Take by mouth.     glimepiride 2 MG tablet  Commonly known as:  AMARYL  Take 2 mg by mouth daily with breakfast.     indomethacin 25 MG capsule  Commonly known as:  INDOCIN  Take 25 mg by mouth. Reported on 05/17/2016     levothyroxine 75 MCG tablet  Commonly known as:  SYNTHROID, LEVOTHROID  Take by mouth. Reported on 04/23/2016     lisinopril-hydrochlorothiazide 20-12.5 MG tablet  Commonly known as:  PRINZIDE,ZESTORETIC  Take by mouth.     metFORMIN 500 MG tablet  Commonly known as:  GLUCOPHAGE  Take by mouth 2 (two) times daily with a meal. Reported on 05/17/2016     MYRBETRIQ 25 MG Tb24 tablet  Generic drug:  mirabegron ER  Reported on 05/17/2016     omeprazole 40 MG capsule  Commonly known as:  PRILOSEC  Take 40 mg by mouth daily.     ONE TOUCH ULTRA TEST test strip  Generic  drug:  glucose blood  USE ONCE DAILY. AS INSTRUCTED.     onetouch ultrasoft lancets  USE 1 EACH ONCE DAILY. USE AS INSTRUCTED. ONE TOUCH LANCETS     PREMARIN vaginal cream  Generic drug:  conjugated estrogens  Reported on 05/17/2016     sertraline 50 MG tablet  Commonly known as:  ZOLOFT  Take 50 mg by mouth.     simvastatin 40 MG tablet  Commonly known as:  ZOCOR  Take 40 mg by mouth daily.     solifenacin 10 MG tablet  Commonly known as:  VESICARE  Take by mouth. Reported on 05/17/2016     traZODone 50 MG tablet  Commonly known as:  DESYREL  Take 100 mg by mouth at bedtime.     triamterene-hydrochlorothiazide 37.5-25 MG tablet  Commonly known as:  MAXZIDE-25  Take 1 tablet by mouth daily. Reported on 04/23/2016     VITAMIN D-1000 MAX ST 1000 units tablet  Generic drug:  Cholecalciferol  Take by mouth.        Allergies:  Allergies  Allergen Reactions  . Mirabegron Other (See Comments)    Severe tremor, headache  . Metformin Diarrhea    Family History: Family History  Problem Relation Age of Onset  . Heart disease Mother   . Heart disease Father   . Diabetes Father   . Hypertension Father   . Cancer Brother   . Kidney disease Daughter   . Bladder Cancer Neg Hx     Social History:  reports that she has quit smoking. She does not have any smokeless tobacco history on file. She reports that she does not drink alcohol or use illicit drugs.  ROS: UROLOGY Frequent Urination?: No Hard to postpone urination?: Yes Burning/pain with urination?: No Get up at night to urinate?: No Leakage of urine?: Yes Urine stream starts and stops?: No Trouble starting stream?: No Do you have to strain to urinate?: No Blood in urine?: No Urinary tract infection?: No Sexually transmitted disease?: No Injury to kidneys or bladder?: No Painful intercourse?: No Weak stream?: No Currently pregnant?: No Vaginal bleeding?: No Last menstrual period?:  n  Gastrointestinal Nausea?: No Vomiting?: No Indigestion/heartburn?: No Diarrhea?: No Constipation?: No  Constitutional Fever: No Night sweats?: No Weight loss?: No Fatigue?: No  Skin Skin rash/lesions?: No Itching?: No  Eyes Blurred vision?: No Double vision?: No  Ears/Nose/Throat Sore throat?: No Sinus problems?: Yes  Hematologic/Lymphatic Swollen glands?: No Easy bruising?: Yes  Cardiovascular Leg swelling?: No Chest pain?: No  Respiratory Cough?: No Shortness of breath?: No  Endocrine Excessive thirst?: Yes  Musculoskeletal Back pain?: No Joint pain?: No  Neurological Headaches?: No Dizziness?: No  Psychologic Depression?: Yes Anxiety?: Yes  Physical Exam: BP 136/66 mmHg  Pulse 85  Ht 5' (1.524 m)  Wt 130 lb 11.2 oz (59.285 kg)  BMI 25.53 kg/m2  Constitutional: Well nourished. Alert and oriented, No acute distress. HEENT: [place] AT, moist mucus membranes. Trachea midline, no masses. Cardiovascular: No clubbing, cyanosis, or edema. Respiratory: Normal respiratory effort, no increased work of breathing. GI: Abdomen is soft, non tender, non distended, no abdominal masses.  GU: No CVA tenderness.  No bladder fullness or masses.   Skin: No rashes, bruises or suspicious lesions. Lymph: No cervical or inguinal adenopathy. Neurologic: Grossly intact, no focal deficits, moving all 4 extremities. Psychiatric: Normal mood and affect.  Laboratory Data: Lab Results  Component Value Date   WBC 10.4 02/11/2014   HGB 14.8 02/11/2014   HCT 43.0 02/11/2014   MCV 88 02/11/2014   PLT 191 02/11/2014    Lab Results  Component Value Date   CREATININE 0.98 02/11/2014    Lab Results  Component Value Date   AST 17 01/29/2012   Lab Results  Component Value Date   ALT 21 01/29/2012    Pertinent Imaging: Results for Candice Cook, Candice Cook (MRN [id number]) as of 05/20/2016 19:40  Ref. Range 05/17/2016 10:33  Scan Result Unknown 0    Assessment & Plan:     1. Mixed urinary incontinence:   Patient has symptoms of both stress and urge incontinence.  She did respond well to Myrbetriq, but she experienced severe side effects and had to stop the medication.   She did not find the Vesicare effective.   The Toviaz caused extreme dry mouth.  She is interested in PTNS, but she would like to wait to schedule the treatments in the winter.    - BLADDER SCAN AMB NON-IMAGING   Return for Patient wants to start PTNS therapy in the winter.  These notes generated with voice recognition software. I apologize for typographical errors.  Candice Cook, [facility] [address] [place], [place] [address] [phone]

## 2016-08-10 ENCOUNTER — Ambulatory Visit
Admission: EM | Admit: 2016-08-10 | Discharge: 2016-08-10 | Disposition: A | Discharge status: Home / Self Care | Payer: PPO | Attending: Family Medicine | Admitting: Family Medicine

## 2016-08-10 DIAGNOSIS — N390 Urinary tract infection, site not specified: Secondary | ICD-10-CM

## 2016-08-10 LAB — URINALYSIS COMPLETE WITH MICROSCOPIC (ARMC ONLY)
BILIRUBIN URINE: NEGATIVE
GLUCOSE, UA: NEGATIVE mg/dL
Hgb urine dipstick: NEGATIVE
Ketones, ur: NEGATIVE mg/dL
Nitrite: NEGATIVE
Protein, ur: NEGATIVE mg/dL
RBC / HPF: NONE SEEN RBC/hpf (ref 0–5)
Specific Gravity, Urine: 1.015 (ref 1.005–1.030)
pH: 7 (ref 5.0–8.0)

## 2016-08-10 MED ORDER — AMOXICILLIN-POT CLAVULANATE 875-125 MG PO TABS: 1.0000 | ORAL_TABLET | Freq: Two times a day (BID) | ORAL | 0 refills | Status: DC

## 2016-08-10 NOTE — ED Triage Notes (Signed)
Patient complains of urinary tract symptoms. Patient states that 4 days ago she started having decreased output and urinary pressure, patient states that at times it hurts to urinate.

## 2016-08-10 NOTE — ED Provider Notes (Signed)
[facility]    CSN: [id number] Arrival date & time: 08/10/16  1310  First Provider Contact:  None       History   Chief Complaint Chief Complaint  Patient presents with  . Urinary Tract Infection    HPI Candice Cook is a 75 y.o. female.   The history is provided by the patient.  Urinary Tract Infection  Pain quality:  Aching Pain severity:  Mild Onset quality:  Sudden Duration:  4 days Timing:  Constant Progression:  Worsening Chronicity:  New Recent urinary tract infections: no   Relieved by:  Nothing Urinary symptoms: hesitancy   Associated symptoms: no abdominal pain, no fever, no flank pain, no nausea and no vomiting     Past Medical History:  Diagnosis Date  . Anxiety and depression   . Arthritis   . Cancer ([place])    skin ca  . Diabetes mellitus without complication ([place])   . GERD (gastroesophageal reflux disease)   . Gout   . Heart murmur   . Hypercholesteremia   . Hypertension   . Mixed incontinence   . Thyroid condition     Patient Active Problem List   Diagnosis Date Noted  . Mixed incontinence 03/23/2016    Past Surgical History:  Procedure Laterality Date  . ABDOMINAL HYSTERECTOMY    . CHOLECYSTECTOMY    . KIDNEY SURGERY     gave daughter a kidney    OB History    No data available       Home Medications    Prior to Admission medications   Medication Sig Start Date End Date Taking? Authorizing Provider  alendronate (FOSAMAX) 70 MG tablet Take 70 mg by mouth. Reported on 05/17/2016 02/02/13  Yes Historical Provider, MD  allopurinol (ZYLOPRIM) 300 MG tablet Take 300 mg by mouth daily.   Yes Historical Provider, MD  aspirin EC 81 MG tablet Take 81 mg by mouth daily.   Yes Historical Provider, MD  Blood Glucose Monitoring Suppl (FIFTY50 GLUCOSE METER 2.0) w/Device KIT  02/20/16 02/19/17 Yes Historical Provider, MD  calcium-vitamin D (OSCAL WITH D) 500-200 MG-UNIT per tablet Take 1 tablet by mouth. Reported on 05/17/2016    Yes Historical Provider, MD  Cholecalciferol (VITAMIN D-1000 MAX ST) 1000 units tablet Take by mouth.   Yes Historical Provider, MD  conjugated estrogens (PREMARIN) vaginal cream Reported on 05/17/2016 06/13/12  Yes Historical Provider, MD  gabapentin (NEURONTIN) 300 MG capsule Take by mouth. 03/15/16  Yes Historical Provider, MD  glimepiride (AMARYL) 2 MG tablet Take 2 mg by mouth daily with breakfast.   Yes Historical Provider, MD  indomethacin (INDOCIN) 25 MG capsule Take 25 mg by mouth. Reported on 05/17/2016 03/08/12  Yes Historical Provider, MD  Lancets (ONETOUCH ULTRASOFT) lancets USE 1 EACH ONCE DAILY. USE AS INSTRUCTED. ONE TOUCH LANCETS 02/20/16  Yes Historical Provider, MD  levothyroxine (SYNTHROID, LEVOTHROID) 75 MCG tablet Take by mouth. Reported on 04/23/2016 02/12/13  Yes Historical Provider, MD  lisinopril-hydrochlorothiazide Reita May) 20-12.5 MG tablet Take by mouth. 02/16/16 02/15/17 Yes Historical Provider, MD  metFORMIN (GLUCOPHAGE) 500 MG tablet Take by mouth 2 (two) times daily with a meal. Reported on 05/17/2016   Yes Historical Provider, MD  omeprazole (PRILOSEC) 40 MG capsule Take 40 mg by mouth daily.   Yes Historical Provider, MD  ONE TOUCH ULTRA TEST test strip USE ONCE DAILY. AS INSTRUCTED. 02/20/16  Yes Historical Provider, MD  sertraline (ZOLOFT) 50 MG tablet Take 50 mg by mouth. 02/12/13  Yes Historical Provider, MD  simvastatin (ZOCOR) 40 MG tablet Take 40 mg by mouth daily.   Yes Historical Provider, MD  traZODone (DESYREL) 50 MG tablet Take 100 mg by mouth at bedtime.    Yes Historical Provider, MD  triamterene-hydrochlorothiazide (MAXZIDE-25) 37.5-25 MG per tablet Take 1 tablet by mouth daily. Reported on 04/23/2016   Yes Historical Provider, MD  amoxicillin-clavulanate (AUGMENTIN) 875-125 MG tablet Take 1 tablet by mouth 2 (two) times daily. 08/10/16   Norval Gable, MD  MYRBETRIQ 25 MG TB24 tablet Reported on 05/17/2016 01/15/16   Historical Provider, MD    solifenacin (VESICARE) 10 MG tablet Take by mouth. Reported on 05/17/2016    Historical Provider, MD    Family History Family History  Problem Relation Age of Onset  . Heart disease Mother   . Heart disease Father   . Diabetes Father   . Hypertension Father   . Cancer Brother   . Kidney disease Daughter   . Bladder Cancer Neg Hx     Social History Social History  Substance Use Topics  . Smoking status: Former Research scientist (life sciences)  . Smokeless tobacco: Never Used     Comment: quit 25 years  . Alcohol use No     Allergies   Mirabegron and Metformin   Review of Systems Review of Systems  Constitutional: Negative for fever.  Gastrointestinal: Negative for abdominal pain, nausea and vomiting.  Genitourinary: Negative for flank pain.     Physical Exam Triage Vital Signs ED Triage Vitals  Enc Vitals Group     BP 08/10/16 1326 (!) 183/57     Pulse Rate 08/10/16 1326 (!) 58     Resp 08/10/16 1326 16     Temp 08/10/16 1326 98.2 F (36.8 C)     Temp Source 08/10/16 1326 Oral     SpO2 08/10/16 1326 97 %     Weight 08/10/16 1328 130 lb (59 kg)     Height 08/10/16 1328 5' 1"  (1.549 m)     Head Circumference --      Peak Flow --      Pain Score 08/10/16 1333 0     Pain Loc --      Pain Edu? --      Excl. in [employer]? --    No data found.   Updated Vital Signs BP (!) 183/57 (BP Location: Left Arm)   Pulse (!) 58   Temp 98.2 F (36.8 C) (Oral)   Resp 16   Ht 5' 1"  (1.549 m)   Wt 130 lb (59 kg)   SpO2 97%   BMI 24.56 kg/m   Visual Acuity Right Eye Distance:   Left Eye Distance:   Bilateral Distance:    Right Eye Near:   Left Eye Near:    Bilateral Near:     Physical Exam  Constitutional: She appears well-developed and well-nourished. No distress.  Abdominal: Soft. Bowel sounds are normal. She exhibits no distension and no mass. There is no tenderness. There is no rebound and no guarding.  Skin: She is not diaphoretic.  Nursing note and vitals reviewed.    UC  Treatments / Results  Labs (all labs ordered are listed, but only abnormal results are displayed) Labs Reviewed  URINALYSIS COMPLETEWITH MICROSCOPIC ([facility] ONLY) - Abnormal; Notable for the following:       Result Value   Color, Urine STRAW (*)    APPearance HAZY (*)    Leukocytes, UA SMALL (*)    Bacteria, UA RARE (*)  Squamous Epithelial / LPF 6-30 (*)    All other components within normal limits    EKG  EKG Interpretation None       Radiology No results found.  Procedures Procedures (including critical care time)  Medications Ordered in UC Medications - No data to display   Initial Impression / Assessment and Plan / UC Course  I have reviewed the triage vital signs and the nursing notes.  Pertinent labs & imaging results that were available during my care of the patient were reviewed by me and considered in my medical decision making (see chart for details).  Clinical Course      Final Clinical Impressions(s) / UC Diagnoses   Final diagnoses:  UTI (lower urinary tract infection)    New Prescriptions New Prescriptions   AMOXICILLIN-CLAVULANATE (AUGMENTIN) 875-125 MG TABLET    Take 1 tablet by mouth 2 (two) times daily.   1. Lab results and diagnosis reviewed with patient 2. rx as per orders above; reviewed possible side effects, interactions, risks and benefits  3. Recommend supportive treatment with increased water 4. Follow-up prn if symptoms worsen or don't improve   Norval Gable, MD 08/10/16 1430

## 2016-09-01 ENCOUNTER — Encounter: Payer: Self-pay | Admitting: *Deleted

## 2016-09-01 ENCOUNTER — Ambulatory Visit
Admission: EM | Admit: 2016-09-01 | Discharge: 2016-09-01 | Disposition: A | Discharge status: Home / Self Care | Payer: PPO | Attending: Family Medicine | Admitting: Family Medicine

## 2016-09-01 DIAGNOSIS — J111 Influenza due to unidentified influenza virus with other respiratory manifestations: Secondary | ICD-10-CM

## 2016-09-01 DIAGNOSIS — J069 Acute upper respiratory infection, unspecified: Secondary | ICD-10-CM

## 2016-09-01 DIAGNOSIS — R69 Illness, unspecified: Secondary | ICD-10-CM

## 2016-09-01 LAB — RAPID INFLUENZA A&B ANTIGENS (ARMC ONLY): INFLUENZA B (ARMC): NEGATIVE

## 2016-09-01 LAB — RAPID INFLUENZA A&B ANTIGENS: Influenza A (ARMC): NEGATIVE

## 2016-09-01 LAB — RAPID STREP SCREEN (MED CTR MEBANE ONLY): Streptococcus, Group A Screen (Direct): NEGATIVE

## 2016-09-01 MED ORDER — FEXOFENADINE-PSEUDOEPHED ER 180-240 MG PO TB24: 1.0000 | ORAL_TABLET | Freq: Every day | ORAL | 0 refills | Status: DC

## 2016-09-01 MED ORDER — AZITHROMYCIN 250 MG PO TABS: ORAL_TABLET | ORAL | 0 refills | Status: DC

## 2016-09-01 NOTE — ED Triage Notes (Signed)
Runny nose, head congestion, watery eyes, sore throat, non-productive cough, and body aches since Tuesday.

## 2016-09-01 NOTE — Discharge Instructions (Signed)
This appears be more of a febrile illness but because of her history of always developing bad bacterial infections among infections from this head will place her on a Z-Pak to use if not better by Thursday. Allegra-D for the congestion.

## 2016-09-01 NOTE — ED Provider Notes (Signed)
[facility]    CSN: [id number] Arrival date & time: 09/01/16  5053  First Provider Contact:  First MD Initiated Contact with Patient 09/01/16 1026        History   Chief Complaint Chief Complaint  Patient presents with  . Cough  . Nasal Congestion  . Generalized Body Aches    HPI Candice Cook is a 75 y.o. female.   Patient is here because of URI symptoms. She states it really kicked off yesterday after running nose on Monday. Yesterday she had a sore throat cough congestion aching all over. She states that she had a fever when she got here but didn't check it at home. According to her she's had this before and that also went ahead and use develops into upper suppository tract infection with laser down for several days. He is here and treated before he gets bad. Past medical history she has a history of diabetes hypertension gout skin cancer thyroid disease some anxiety and depression as well. 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    The history is provided by the patient. No language interpreter was used.  Cough  Cough characteristics:  Non-productive Severity:  Moderate Onset quality:  Sudden Timing:  Constant Progression:  Worsening Chronicity:  New Smoker: no   Context: upper respiratory infection   Relieved by:  Nothing Worsened by:  Nothing Associated symptoms: chills, fever, headaches, myalgias, sinus congestion and sore throat   Associated symptoms: no rash, no shortness of breath and no wheezing     Past Medical History:  Diagnosis Date  . Anxiety and depression   . Arthritis   . Cancer ([place])    skin ca  . Diabetes mellitus without complication ([place])   . GERD (gastroesophageal  reflux disease)   . Gout   . Heart murmur   . Hypercholesteremia   . Hypertension   . Mixed incontinence   . Thyroid condition     Patient Active Problem List   Diagnosis Date Noted  . Mixed incontinence 03/23/2016    Past Surgical History:  Procedure Laterality Date  . ABDOMINAL HYSTERECTOMY    . CHOLECYSTECTOMY    . KIDNEY SURGERY     gave daughter a kidney    OB History    No data available       Home Medications    Prior to Admission medications   Medication Sig Start Date End Date Taking? Authorizing Provider  allopurinol (ZYLOPRIM) 300 MG tablet Take 300 mg by mouth daily.   Yes Historical Provider, MD  aspirin EC 81 MG tablet Take 81 mg by mouth daily.   Yes Historical Provider, MD  Blood Glucose Monitoring Suppl (FIFTY50 GLUCOSE METER 2.0) w/Device KIT  02/20/16 02/19/17 Yes Historical Provider, MD  conjugated estrogens (PREMARIN) vaginal cream Reported on 05/17/2016 06/13/12  Yes Historical Provider, MD  gabapentin (NEURONTIN) 300 MG capsule Take by mouth. 03/15/16  Yes Historical Provider, MD  glimepiride (AMARYL) 2 MG tablet Take 2 mg by mouth daily with breakfast.   Yes Historical Provider, MD  indomethacin (INDOCIN) 25 MG capsule Take 25 mg by mouth. Reported on 05/17/2016 03/08/12  Yes Historical Provider, MD  Lancets (ONETOUCH ULTRASOFT) lancets USE 1 EACH ONCE DAILY.  USE AS INSTRUCTED. ONE TOUCH LANCETS 02/20/16  Yes Historical Provider, MD  lisinopril-hydrochlorothiazide (PRINZIDE,ZESTORETIC) 20-12.5 MG tablet Take by mouth. 02/16/16 02/15/17 Yes Historical Provider, MD  metFORMIN (GLUCOPHAGE) 500 MG tablet Take by mouth 2 (two) times daily with a meal. Reported on 05/17/2016   Yes Historical Provider, MD  omeprazole (PRILOSEC) 40 MG capsule Take 40 mg by mouth daily.   Yes Historical Provider, MD  ONE TOUCH ULTRA TEST test strip USE ONCE DAILY. AS INSTRUCTED. 02/20/16  Yes Historical Provider, MD  sertraline (ZOLOFT) 50 MG tablet Take 50 mg by mouth. 02/12/13  Yes  Historical Provider, MD  simvastatin (ZOCOR) 40 MG tablet Take 40 mg by mouth daily.   Yes Historical Provider, MD  traZODone (DESYREL) 50 MG tablet Take 100 mg by mouth at bedtime.    Yes Historical Provider, MD  alendronate (FOSAMAX) 70 MG tablet Take 70 mg by mouth. Reported on 05/17/2016 02/02/13   Historical Provider, MD  amoxicillin-clavulanate (AUGMENTIN) 875-125 MG tablet Take 1 tablet by mouth 2 (two) times daily. 08/10/16   Norval Gable, MD  azithromycin (ZITHROMAX Z-PAK) 250 MG tablet Take 2 tablets first day and then 1 po a day for 4 days 09/03/16   Frederich Cha, MD  calcium-vitamin D (OSCAL WITH D) 500-200 MG-UNIT per tablet Take 1 tablet by mouth. Reported on 05/17/2016    Historical Provider, MD  Cholecalciferol (VITAMIN D-1000 MAX ST) 1000 units tablet Take by mouth.    Historical Provider, MD  fexofenadine-pseudoephedrine (ALLEGRA-D ALLERGY & CONGESTION) 180-240 MG 24 hr tablet Take 1 tablet by mouth daily. 09/01/16   Frederich Cha, MD  levothyroxine (SYNTHROID, LEVOTHROID) 75 MCG tablet Take by mouth. Reported on 04/23/2016 02/12/13   Historical Provider, MD  MYRBETRIQ 25 MG TB24 tablet Reported on 05/17/2016 01/15/16   Historical Provider, MD  solifenacin (VESICARE) 10 MG tablet Take by mouth. Reported on 05/17/2016    Historical Provider, MD  triamterene-hydrochlorothiazide (MAXZIDE-25) 37.5-25 MG per tablet Take 1 tablet by mouth daily. Reported on 04/23/2016    Historical Provider, MD    Family History Family History  Problem Relation Age of Onset  . Heart disease Mother   . Heart disease Father   . Diabetes Father   . Hypertension Father   . Cancer Brother   . Kidney disease Daughter   . Bladder Cancer Neg Hx     Social History Social History  Substance Use Topics  . Smoking status: Former Research scientist (life sciences)  . Smokeless tobacco: Never Used     Comment: quit 25 years  . Alcohol use No     Allergies   Mirabegron and Metformin   Review of Systems Review of Systems  Constitutional:  Positive for chills and fever.  HENT: Positive for sore throat.   Respiratory: Positive for cough. Negative for shortness of breath and wheezing.   Musculoskeletal: Positive for myalgias.  Skin: Negative for rash.  Neurological: Positive for headaches.  All other systems reviewed and are negative.    Physical Exam Triage Vital Signs ED Triage Vitals  Enc Vitals Group     BP 09/01/16 0941 (!) 125/54     Pulse Rate 09/01/16 0941 (!) 120     Resp 09/01/16 0941 16     Temp 09/01/16 0941 99.8 F (37.7 C)     Temp Source 09/01/16 0941 Oral     SpO2 09/01/16 0941 94 %     Weight 09/01/16 0944 125 lb (56.7 kg)     Height 09/01/16 0944 5' (1.524  m)     Head Circumference --      Peak Flow --      Pain Score --      Pain Loc --      Pain Edu? --      Excl. in [place]? --    No data found.   Updated Vital Signs BP (!) 125/54 (BP Location: Left Arm)   Pulse (!) 120   Temp 99.8 F (37.7 C) (Oral)   Resp 16   Ht 5' (1.524 m)   Wt 125 lb (56.7 kg)   SpO2 94%   BMI 24.41 kg/m   Visual Acuity Right Eye Distance:   Left Eye Distance:   Bilateral Distance:    Right Eye Near:   Left Eye Near:    Bilateral Near:     Physical Exam  Constitutional: She is oriented to person, place, and time. She appears well-developed and well-nourished. No distress.  HENT:  Head: Normocephalic and atraumatic.  Right Ear: Hearing, tympanic membrane, external ear and ear canal normal.  Left Ear: Hearing, tympanic membrane, external ear and ear canal normal.  Nose: Rhinorrhea present.  Mouth/Throat: Uvula is midline and mucous membranes are normal. Oral lesions present. Normal dentition. No dental caries. Posterior oropharyngeal erythema present.  Eyes: Pupils are equal, round, and reactive to light.  Neck: Normal range of motion. Neck supple.  Cardiovascular: Normal rate and regular rhythm.   Pulmonary/Chest: Effort normal and breath sounds normal.  Musculoskeletal: Normal range of motion. She  exhibits no edema or deformity.  Neurological: She is alert and oriented to person, place, and time.  Skin: Skin is warm.  Psychiatric: She has a normal mood and affect.  Vitals reviewed.    UC Treatments / Results  Labs (all labs ordered are listed, but only abnormal results are displayed) Labs Reviewed  RAPID STREP SCREEN (NOT AT [facility])  RAPID INFLUENZA A&B ANTIGENS ([facility] ONLY)  CULTURE, GROUP A STREP [facility])    EKG  EKG Interpretation None       Radiology No results found.  Procedures Procedures (including critical care time)  Medications Ordered in UC Medications - No data to display   Initial Impression / Assessment and Plan / UC Course  I have reviewed the triage vital signs and the nursing notes.  Pertinent labs & imaging results that were available during my care of the patient were reviewed by me and considered in my medical decision making (see chart for details).    Results for orders placed or performed during the hospital encounter of 09/01/16  Rapid strep screen  Result Value Ref Range   Streptococcus, Group A Screen ([facility]) NEGATIVE NEGATIVE  Rapid Influenza A&B Antigens ([facility] only)  Result Value Ref Range   Influenza A ([facility]) NEGATIVE NEGATIVE   Influenza B ([facility]) NEGATIVE NEGATIVE   Clinical Course  Patient appears to have a viral illness however since her history of always having exacerbation of his gout illnesses tobacco infections I will place on Zithromax breath and still need to wait at least 2 more days. Prescription can be filled on 08/03/2016 up until 08/25/2016. Follow-up with Dr. Kathalene Frames or PCP of choice not better in a week also add Allegra-D to her medication regimen as well    Final Clinical Impressions(s) / UC Diagnoses   Final diagnoses:  URI (upper respiratory infection)  Influenza-like illness    New Prescriptions New Prescriptions   AZITHROMYCIN (ZITHROMAX Z-PAK) 250 MG TABLET    Take 2 tablets first  day and then 1 po a  day for 4 days   FEXOFENADINE-PSEUDOEPHEDRINE (ALLEGRA-D ALLERGY & CONGESTION) 180-240 MG 24 HR TABLET    Take 1 tablet by mouth daily.     Frederich Cha, MD 09/01/16 [phone]

## 2016-09-03 LAB — CULTURE, GROUP A STREP (THRC)

## 2016-09-08 ENCOUNTER — Telehealth: Payer: Self-pay | Admitting: *Deleted

## 2016-09-08 NOTE — Telephone Encounter (Signed)
Called patient and informed her that her strep culture results came back negative. Patient reports symptom improvement and confirmed understanding of information.

## 2016-09-16 DIAGNOSIS — Z23 Encounter for immunization: Secondary | ICD-10-CM | POA: Diagnosis not present

## 2016-09-16 DIAGNOSIS — N3941 Urge incontinence: Secondary | ICD-10-CM | POA: Diagnosis not present

## 2016-09-16 DIAGNOSIS — Z79899 Other long term (current) drug therapy: Secondary | ICD-10-CM | POA: Diagnosis not present

## 2016-09-16 DIAGNOSIS — M1A00X0 Idiopathic chronic gout, unspecified site, without tophus (tophi): Secondary | ICD-10-CM | POA: Diagnosis not present

## 2016-09-16 DIAGNOSIS — E538 Deficiency of other specified B group vitamins: Secondary | ICD-10-CM | POA: Diagnosis not present

## 2016-09-16 DIAGNOSIS — K219 Gastro-esophageal reflux disease without esophagitis: Secondary | ICD-10-CM | POA: Diagnosis not present

## 2016-09-16 DIAGNOSIS — E559 Vitamin D deficiency, unspecified: Secondary | ICD-10-CM | POA: Diagnosis not present

## 2016-09-16 DIAGNOSIS — E1142 Type 2 diabetes mellitus with diabetic polyneuropathy: Secondary | ICD-10-CM | POA: Diagnosis not present

## 2016-09-16 DIAGNOSIS — I10 Essential (primary) hypertension: Secondary | ICD-10-CM | POA: Diagnosis not present

## 2016-09-16 DIAGNOSIS — E782 Mixed hyperlipidemia: Secondary | ICD-10-CM | POA: Diagnosis not present

## 2016-09-16 DIAGNOSIS — F5101 Primary insomnia: Secondary | ICD-10-CM | POA: Diagnosis not present

## 2016-09-16 DIAGNOSIS — E039 Hypothyroidism, unspecified: Secondary | ICD-10-CM | POA: Diagnosis not present

## 2016-09-17 DIAGNOSIS — Z79899 Other long term (current) drug therapy: Secondary | ICD-10-CM | POA: Diagnosis not present

## 2016-09-17 DIAGNOSIS — E538 Deficiency of other specified B group vitamins: Secondary | ICD-10-CM | POA: Diagnosis not present

## 2016-09-17 DIAGNOSIS — E039 Hypothyroidism, unspecified: Secondary | ICD-10-CM | POA: Diagnosis not present

## 2016-09-17 DIAGNOSIS — M1A00X0 Idiopathic chronic gout, unspecified site, without tophus (tophi): Secondary | ICD-10-CM | POA: Diagnosis not present

## 2016-09-17 DIAGNOSIS — Z23 Encounter for immunization: Secondary | ICD-10-CM | POA: Diagnosis not present

## 2016-09-17 DIAGNOSIS — K219 Gastro-esophageal reflux disease without esophagitis: Secondary | ICD-10-CM | POA: Diagnosis not present

## 2016-09-17 DIAGNOSIS — E1142 Type 2 diabetes mellitus with diabetic polyneuropathy: Secondary | ICD-10-CM | POA: Diagnosis not present

## 2016-09-17 DIAGNOSIS — E782 Mixed hyperlipidemia: Secondary | ICD-10-CM | POA: Diagnosis not present

## 2016-09-17 DIAGNOSIS — E559 Vitamin D deficiency, unspecified: Secondary | ICD-10-CM | POA: Diagnosis not present

## 2016-09-17 DIAGNOSIS — F5101 Primary insomnia: Secondary | ICD-10-CM | POA: Diagnosis not present

## 2016-09-17 DIAGNOSIS — N3941 Urge incontinence: Secondary | ICD-10-CM | POA: Diagnosis not present

## 2016-09-17 DIAGNOSIS — I10 Essential (primary) hypertension: Secondary | ICD-10-CM | POA: Diagnosis not present

## 2016-09-30 DIAGNOSIS — F0280 Dementia in other diseases classified elsewhere without behavioral disturbance: Secondary | ICD-10-CM | POA: Insufficient documentation

## 2016-10-05 ENCOUNTER — Ambulatory Visit
Admission: EM | Admit: 2016-10-05 | Discharge: 2016-10-05 | Disposition: A | Discharge status: Home / Self Care | Payer: PPO | Attending: Family Medicine | Admitting: Family Medicine

## 2016-10-05 ENCOUNTER — Encounter: Payer: Self-pay | Admitting: *Deleted

## 2016-10-05 ENCOUNTER — Ambulatory Visit (INDEPENDENT_AMBULATORY_CARE_PROVIDER_SITE_OTHER): Payer: PPO

## 2016-10-05 DIAGNOSIS — W19XXXA Unspecified fall, initial encounter: Secondary | ICD-10-CM

## 2016-10-05 DIAGNOSIS — S8991XA Unspecified injury of right lower leg, initial encounter: Secondary | ICD-10-CM | POA: Diagnosis not present

## 2016-10-05 DIAGNOSIS — S4991XA Unspecified injury of right shoulder and upper arm, initial encounter: Secondary | ICD-10-CM | POA: Diagnosis not present

## 2016-10-05 DIAGNOSIS — S59901A Unspecified injury of right elbow, initial encounter: Secondary | ICD-10-CM

## 2016-10-05 DIAGNOSIS — M25511 Pain in right shoulder: Secondary | ICD-10-CM

## 2016-10-05 DIAGNOSIS — M25561 Pain in right knee: Secondary | ICD-10-CM | POA: Diagnosis not present

## 2016-10-05 DIAGNOSIS — M25461 Effusion, right knee: Secondary | ICD-10-CM | POA: Diagnosis not present

## 2016-10-05 DIAGNOSIS — M25521 Pain in right elbow: Secondary | ICD-10-CM | POA: Diagnosis not present

## 2016-10-05 MED ORDER — MELOXICAM 7.5 MG PO TABS: 7.5000 mg | ORAL_TABLET | Freq: Every day | ORAL | 0 refills | Status: DC

## 2016-10-05 MED ORDER — KETOROLAC TROMETHAMINE 60 MG/2ML IM SOLN
30.0000 mg | Freq: Once | INTRAMUSCULAR | Status: AC
  Administered 2016-10-05: 30 mg via INTRAMUSCULAR

## 2016-10-05 MED ORDER — TIZANIDINE HCL 4 MG PO TABS: 4.0000 mg | ORAL_TABLET | Freq: Three times a day (TID) | ORAL | 0 refills | Status: DC | PRN

## 2016-10-05 NOTE — ED Triage Notes (Signed)
Pt fell 6 weeks ago injuring right arm/shoulder and right knee. Now c/o thoracic back , right shoulder, right arm, as well as right knee. Pain has been persistent since the original fall and has become worse in past few days. No re-injury since.

## 2016-10-05 NOTE — ED Provider Notes (Signed)
[facility]    CSN: [id number] Arrival date & time: 10/05/16  1433     History   Chief Complaint Chief Complaint  Patient presents with  . Back Pain  . Shoulder Pain  . Arm Injury  . Knee Pain    HPI Candice Cook is a 75 y.o. female.   Patient reports falling about 6 weeks ago. According to husband she fell on the bed than on the right side. He states he heard her fall but thought that the cat had fallen out of the bed. She had a black eye multiple bruises over her shoulder knee and side. She denies any loss of consciousness at the time did not seek medical help. She reports that the bruising has since it cleared she's had progressive increase in pain on her right shoulder right knee and right elbow. She states pain today is a 10 out of 10 and is using icy hot type pain and did not work. She has history of diabetes borderline hypertension GERD gout hyperlipidemia she's had some arthritis and anxiety as well. He takes baby aspirin daily. No known or pertinent medical history relevant to this visit. She is a former smoker and she is allergic to Mirabegron.   The history is provided by the patient and a significant other. No language interpreter was used.  Back Pain  Location:  Generalized Quality:  Aching Radiates to:  R shoulder and R knee Pain severity:  Severe Pain is:  Unable to specify Duration:  6 weeks Timing:  Constant Progression:  Worsening Chronicity:  Chronic Context: falling   Relieved by:  Nothing Associated symptoms: no headaches   Shoulder Pain  Associated symptoms: back pain   Arm Injury  Associated symptoms: back pain   Knee Pain  Associated symptoms: back pain     Past Medical History:  Diagnosis Date  . Anxiety and depression   . Arthritis   . Cancer ([place])    skin ca  . Diabetes mellitus without complication ([place])   . GERD (gastroesophageal reflux disease)   . Gout   . Heart murmur   . Hypercholesteremia   . Hypertension   .  Mixed incontinence   . Thyroid condition     Patient Active Problem List   Diagnosis Date Noted  . Mixed incontinence 03/23/2016    Past Surgical History:  Procedure Laterality Date  . ABDOMINAL HYSTERECTOMY    . CHOLECYSTECTOMY    . KIDNEY SURGERY     gave daughter a kidney    OB History    No data available       Home Medications    Prior to Admission medications   Medication Sig Start Date End Date Taking? Authorizing Provider  allopurinol (ZYLOPRIM) 300 MG tablet Take 300 mg by mouth daily.   Yes Historical Provider, MD  aspirin EC 81 MG tablet Take 81 mg by mouth daily.   Yes Historical Provider, MD  Blood Glucose Monitoring Suppl (FIFTY50 GLUCOSE METER 2.0) w/Device KIT  02/20/16 02/19/17 Yes Historical Provider, MD  Cholecalciferol (VITAMIN D-1000 MAX ST) 1000 units tablet Take by mouth.   Yes Historical Provider, MD  gabapentin (NEURONTIN) 300 MG capsule Take by mouth. 03/15/16  Yes Historical Provider, MD  glimepiride (AMARYL) 2 MG tablet Take 2 mg by mouth daily with breakfast.   Yes Historical Provider, MD  Lancets (ONETOUCH ULTRASOFT) lancets USE 1 EACH ONCE DAILY. USE AS INSTRUCTED. ONE TOUCH LANCETS 02/20/16  Yes Historical Provider, MD  lisinopril-hydrochlorothiazide (PRINZIDE,ZESTORETIC) 20-12.5 MG tablet Take by mouth. 02/16/16 02/15/17 Yes Historical Provider, MD  metFORMIN (GLUCOPHAGE) 500 MG tablet Take by mouth 2 (two) times daily with a meal. Reported on 05/17/2016   Yes Historical Provider, MD  omeprazole (PRILOSEC) 40 MG capsule Take 40 mg by mouth daily.   Yes Historical Provider, MD  ONE TOUCH ULTRA TEST test strip USE ONCE DAILY. AS INSTRUCTED. 02/20/16  Yes Historical Provider, MD  sertraline (ZOLOFT) 50 MG tablet Take 50 mg by mouth. 02/12/13  Yes Historical Provider, MD  simvastatin (ZOCOR) 40 MG tablet Take 40 mg by mouth daily.   Yes Historical Provider, MD  traZODone (DESYREL) 50 MG tablet Take 100 mg by mouth at bedtime.    Yes Historical Provider, MD    triamterene-hydrochlorothiazide (MAXZIDE-25) 37.5-25 MG per tablet Take 1 tablet by mouth daily. Reported on 04/23/2016   Yes Historical Provider, MD  alendronate (FOSAMAX) 70 MG tablet Take 70 mg by mouth. Reported on 05/17/2016 02/02/13   Historical Provider, MD  amoxicillin-clavulanate (AUGMENTIN) 875-125 MG tablet Take 1 tablet by mouth 2 (two) times daily. 08/10/16   Norval Gable, MD  azithromycin (ZITHROMAX Z-PAK) 250 MG tablet Take 2 tablets first day and then 1 po a day for 4 days 09/03/16   Frederich Cha, MD  calcium-vitamin D (OSCAL WITH D) 500-200 MG-UNIT per tablet Take 1 tablet by mouth. Reported on 05/17/2016    Historical Provider, MD  conjugated estrogens (PREMARIN) vaginal cream Reported on 05/17/2016 06/13/12   Historical Provider, MD  fexofenadine-pseudoephedrine (ALLEGRA-D ALLERGY & CONGESTION) 180-240 MG 24 hr tablet Take 1 tablet by mouth daily. 09/01/16   Frederich Cha, MD  indomethacin (INDOCIN) 25 MG capsule Take 25 mg by mouth. Reported on 05/17/2016 03/08/12   Historical Provider, MD  levothyroxine (SYNTHROID, LEVOTHROID) 75 MCG tablet Take by mouth. Reported on 04/23/2016 02/12/13   Historical Provider, MD  meloxicam (MOBIC) 7.5 MG tablet Take 1 tablet (7.5 mg total) by mouth daily. 10/05/16   Frederich Cha, MD  MYRBETRIQ 25 MG TB24 tablet Reported on 05/17/2016 01/15/16   Historical Provider, MD  solifenacin (VESICARE) 10 MG tablet Take by mouth. Reported on 05/17/2016    Historical Provider, MD  tiZANidine (ZANAFLEX) 4 MG tablet Take 1 tablet (4 mg total) by mouth every 8 (eight) hours as needed for muscle spasms. 10/05/16   Frederich Cha, MD    Family History Family History  Problem Relation Age of Onset  . Heart disease Mother   . Heart disease Father   . Diabetes Father   . Hypertension Father   . Cancer Brother   . Kidney disease Daughter   . Bladder Cancer Neg Hx     Social History Social History  Substance Use Topics  . Smoking status: Former Research scientist (life sciences)  . Smokeless tobacco:  Never Used     Comment: quit 25 years  . Alcohol use No     Allergies   Mirabegron   Review of Systems Review of Systems  Musculoskeletal: Positive for arthralgias, back pain, gait problem, joint swelling and myalgias.  Neurological: Negative for dizziness, facial asymmetry, speech difficulty and headaches.  All other systems reviewed and are negative.    Physical Exam Triage Vital Signs ED Triage Vitals  Enc Vitals Group     BP 10/05/16 1456 (!) 144/48     Pulse Rate 10/05/16 1456 70     Resp 10/05/16 1456 16     Temp 10/05/16 1456 98 F (36.7 C)  Temp Source 10/05/16 1456 Oral     SpO2 10/05/16 1456 94 %     Weight 10/05/16 1458 130 lb (59 kg)     Height 10/05/16 1458 _0  (1.549 m)     Head Circumference --      Peak Flow --      Pain Score 10/05/16 1509 10     Pain Loc --      Pain Edu? --      Excl. in [place]? --    No data found.   Updated Vital Signs BP (!) 144/48 (BP Location: Left Arm)   Pulse 70   Temp 98 F (36.7 C) (Oral)   Resp 16   Ht _1  (1.549 m)   Wt 130 lb (59 kg)   SpO2 94%   BMI 24.56 kg/m   Visual Acuity Right Eye Distance:   Left Eye Distance:   Bilateral Distance:    Right Eye Near:   Left Eye Near:    Bilateral Near:     Physical Exam  Constitutional: She is oriented to person, place, and time. She appears well-developed and well-nourished.  HENT:  Head: Normocephalic and atraumatic.  Eyes: Pupils are equal, round, and reactive to light.  Pulmonary/Chest: Effort normal.  Musculoskeletal: Normal range of motion. She exhibits tenderness.       Right knee: Tenderness found.       Right forearm: She exhibits tenderness and bony tenderness. She exhibits no swelling, no edema, no deformity and no laceration.       Arms:      Legs: Patient has palpable tenderness in the right shoulder over the superior lateral border the right knee and over the right scapula and right shoulder area.  Neurological: She is alert and oriented  to person, place, and time. She has normal reflexes. No cranial nerve deficit.  Skin: Skin is warm.  Psychiatric: She has a normal mood and affect.  Vitals reviewed.    UC Treatments / Results  Labs (all labs ordered are listed, but only abnormal results are displayed) Labs Reviewed - No data to display  EKG  EKG Interpretation None       Radiology Dg Shoulder Right  Result Date: 10/05/2016 CLINICAL DATA:  Recent fall with right shoulder pain, initial encounter EXAM: RIGHT SHOULDER - 2+ VIEW COMPARISON:  None. FINDINGS: There is no evidence of fracture or dislocation. There is no evidence of arthropathy or other focal bone abnormality. Soft tissues are unremarkable. IMPRESSION: No acute abnormality noted. Electronically Signed   By: Inez Catalina M.D.   On: 10/05/2016 16:41   Dg Elbow Complete Right  Result Date: 10/05/2016 CLINICAL DATA:  Recent fall with right elbow pain, initial encounter EXAM: RIGHT ELBOW - COMPLETE 3+ VIEW COMPARISON:  None. FINDINGS: There is no evidence of fracture, dislocation, or joint effusion. There is no evidence of arthropathy or other focal bone abnormality. Soft tissues are unremarkable. IMPRESSION: No acute abnormality noted. Electronically Signed   By: Inez Catalina M.D.   On: 10/05/2016 16:42   Dg Knee Complete 4 Views Right  Result Date: 10/05/2016 CLINICAL DATA:  Fall 6 weeks ago, worsening pain EXAM: RIGHT KNEE - COMPLETE 4+ VIEW COMPARISON:  None. FINDINGS: Four views of the right knee submitted. No acute fracture or subluxation. Large joint effusion is noted. No radiopaque foreign body. IMPRESSION: No acute fracture or subluxation.  Large joint effusion. Electronically Signed   By: Lahoma Crocker M.D.   On: 10/05/2016 16:41  Procedures Procedures (including critical care time)  Medications Ordered in UC Medications  ketorolac (TORADOL) injection 30 mg (30 mg Intramuscular Given 10/05/16 1606)     Initial Impression / Assessment and Plan  / UC Course  I have reviewed the triage vital signs and the nursing notes.  Pertinent labs & imaging results that were available during my care of the patient were reviewed by me and considered in my medical decision making (see chart for details).  Clinical Course    Patient was given a shot of Toradol 30 mg help the pain. She has some effusion on the deep all the x-rays are negative for fracture. We will place on Mobic 7.5 mg 1 tablet day Zanaflex for joint pain and myalgia. Follow-up with PCP in 2-3 weeks not better.   Final Clinical Impressions(s) / UC Diagnoses   Final diagnoses:  Acute pain of right shoulder  Fall, initial encounter  Acute pain of right knee  Elbow injury, right, initial encounter  Knee injuries, right, initial encounter  Shoulder injury, right, initial encounter    New Prescriptions New Prescriptions   MELOXICAM (MOBIC) 7.5 MG TABLET    Take 1 tablet (7.5 mg total) by mouth daily.   TIZANIDINE (ZANAFLEX) 4 MG TABLET    Take 1 tablet (4 mg total) by mouth every 8 (eight) hours as needed for muscle spasms.     Frederich Cha, MD 10/05/16 [phone]

## 2016-10-07 ENCOUNTER — Telehealth: Payer: Self-pay | Admitting: *Deleted

## 2016-10-12 DIAGNOSIS — G309 Alzheimer's disease, unspecified: Secondary | ICD-10-CM | POA: Diagnosis not present

## 2016-10-12 DIAGNOSIS — M5412 Radiculopathy, cervical region: Secondary | ICD-10-CM | POA: Diagnosis not present

## 2016-10-12 DIAGNOSIS — M47812 Spondylosis without myelopathy or radiculopathy, cervical region: Secondary | ICD-10-CM | POA: Diagnosis not present

## 2016-10-12 DIAGNOSIS — E1142 Type 2 diabetes mellitus with diabetic polyneuropathy: Secondary | ICD-10-CM | POA: Diagnosis not present

## 2016-10-12 DIAGNOSIS — F0280 Dementia in other diseases classified elsewhere without behavioral disturbance: Secondary | ICD-10-CM | POA: Diagnosis not present

## 2016-10-13 ENCOUNTER — Other Ambulatory Visit: Payer: Self-pay | Admitting: Internal Medicine

## 2016-10-13 DIAGNOSIS — M5412 Radiculopathy, cervical region: Secondary | ICD-10-CM

## 2016-10-27 ENCOUNTER — Ambulatory Visit
Admission: RE | Admit: 2016-10-27 | Discharge: 2016-10-27 | Disposition: A | Discharge status: Home / Self Care | Payer: PPO | Source: Ambulatory Visit | Attending: Internal Medicine | Admitting: Internal Medicine

## 2016-10-27 DIAGNOSIS — M4802 Spinal stenosis, cervical region: Secondary | ICD-10-CM | POA: Diagnosis not present

## 2016-10-27 DIAGNOSIS — M5412 Radiculopathy, cervical region: Secondary | ICD-10-CM | POA: Diagnosis not present

## 2016-10-27 DIAGNOSIS — M4682 Other specified inflammatory spondylopathies, cervical region: Secondary | ICD-10-CM | POA: Insufficient documentation

## 2016-11-09 DIAGNOSIS — M4802 Spinal stenosis, cervical region: Secondary | ICD-10-CM | POA: Diagnosis not present

## 2016-11-09 DIAGNOSIS — M5030 Other cervical disc degeneration, unspecified cervical region: Secondary | ICD-10-CM | POA: Diagnosis not present

## 2016-11-09 DIAGNOSIS — M5412 Radiculopathy, cervical region: Secondary | ICD-10-CM | POA: Diagnosis not present

## 2016-11-16 DIAGNOSIS — M6281 Muscle weakness (generalized): Secondary | ICD-10-CM | POA: Diagnosis not present

## 2016-11-16 DIAGNOSIS — M5030 Other cervical disc degeneration, unspecified cervical region: Secondary | ICD-10-CM | POA: Diagnosis not present

## 2016-11-23 DIAGNOSIS — M6281 Muscle weakness (generalized): Secondary | ICD-10-CM | POA: Diagnosis not present

## 2016-11-23 DIAGNOSIS — M5030 Other cervical disc degeneration, unspecified cervical region: Secondary | ICD-10-CM | POA: Diagnosis not present

## 2016-11-25 DIAGNOSIS — M6281 Muscle weakness (generalized): Secondary | ICD-10-CM | POA: Diagnosis not present

## 2016-11-25 DIAGNOSIS — M5030 Other cervical disc degeneration, unspecified cervical region: Secondary | ICD-10-CM | POA: Diagnosis not present

## 2016-11-29 ENCOUNTER — Ambulatory Visit: Payer: PPO | Admitting: Urology

## 2016-11-30 DIAGNOSIS — M6281 Muscle weakness (generalized): Secondary | ICD-10-CM | POA: Diagnosis not present

## 2016-11-30 DIAGNOSIS — M5030 Other cervical disc degeneration, unspecified cervical region: Secondary | ICD-10-CM | POA: Diagnosis not present

## 2016-12-02 DIAGNOSIS — M6281 Muscle weakness (generalized): Secondary | ICD-10-CM | POA: Diagnosis not present

## 2016-12-02 DIAGNOSIS — M5030 Other cervical disc degeneration, unspecified cervical region: Secondary | ICD-10-CM | POA: Diagnosis not present

## 2016-12-14 DIAGNOSIS — M5030 Other cervical disc degeneration, unspecified cervical region: Secondary | ICD-10-CM | POA: Diagnosis not present

## 2016-12-14 DIAGNOSIS — M6281 Muscle weakness (generalized): Secondary | ICD-10-CM | POA: Diagnosis not present

## 2016-12-23 DIAGNOSIS — G301 Alzheimer's disease with late onset: Secondary | ICD-10-CM | POA: Diagnosis not present

## 2016-12-23 DIAGNOSIS — F0280 Dementia in other diseases classified elsewhere without behavioral disturbance: Secondary | ICD-10-CM | POA: Diagnosis not present

## 2017-01-11 DIAGNOSIS — M4802 Spinal stenosis, cervical region: Secondary | ICD-10-CM | POA: Diagnosis not present

## 2017-01-11 DIAGNOSIS — M5412 Radiculopathy, cervical region: Secondary | ICD-10-CM | POA: Diagnosis not present

## 2017-01-11 DIAGNOSIS — M5030 Other cervical disc degeneration, unspecified cervical region: Secondary | ICD-10-CM | POA: Diagnosis not present

## 2017-01-20 DIAGNOSIS — E119 Type 2 diabetes mellitus without complications: Secondary | ICD-10-CM | POA: Diagnosis not present

## 2017-01-27 DIAGNOSIS — H01009 Unspecified blepharitis unspecified eye, unspecified eyelid: Secondary | ICD-10-CM | POA: Diagnosis not present

## 2017-02-04 DIAGNOSIS — L918 Other hypertrophic disorders of the skin: Secondary | ICD-10-CM | POA: Diagnosis not present

## 2017-02-04 DIAGNOSIS — D485 Neoplasm of uncertain behavior of skin: Secondary | ICD-10-CM | POA: Diagnosis not present

## 2017-02-04 DIAGNOSIS — L720 Epidermal cyst: Secondary | ICD-10-CM | POA: Diagnosis not present

## 2017-02-24 DIAGNOSIS — H01009 Unspecified blepharitis unspecified eye, unspecified eyelid: Secondary | ICD-10-CM | POA: Diagnosis not present

## 2017-03-15 DIAGNOSIS — Z79899 Other long term (current) drug therapy: Secondary | ICD-10-CM | POA: Diagnosis not present

## 2017-03-15 DIAGNOSIS — E559 Vitamin D deficiency, unspecified: Secondary | ICD-10-CM | POA: Diagnosis not present

## 2017-03-15 DIAGNOSIS — H2513 Age-related nuclear cataract, bilateral: Secondary | ICD-10-CM | POA: Diagnosis not present

## 2017-03-15 DIAGNOSIS — E538 Deficiency of other specified B group vitamins: Secondary | ICD-10-CM | POA: Diagnosis not present

## 2017-03-15 DIAGNOSIS — G63 Polyneuropathy in diseases classified elsewhere: Secondary | ICD-10-CM | POA: Diagnosis not present

## 2017-03-15 DIAGNOSIS — F0280 Dementia in other diseases classified elsewhere without behavioral disturbance: Secondary | ICD-10-CM | POA: Diagnosis not present

## 2017-03-15 DIAGNOSIS — E1142 Type 2 diabetes mellitus with diabetic polyneuropathy: Secondary | ICD-10-CM | POA: Diagnosis not present

## 2017-03-15 DIAGNOSIS — E782 Mixed hyperlipidemia: Secondary | ICD-10-CM | POA: Diagnosis not present

## 2017-03-15 DIAGNOSIS — I10 Essential (primary) hypertension: Secondary | ICD-10-CM | POA: Diagnosis not present

## 2017-03-15 DIAGNOSIS — E039 Hypothyroidism, unspecified: Secondary | ICD-10-CM | POA: Diagnosis not present

## 2017-03-15 DIAGNOSIS — K219 Gastro-esophageal reflux disease without esophagitis: Secondary | ICD-10-CM | POA: Diagnosis not present

## 2017-03-15 DIAGNOSIS — G301 Alzheimer's disease with late onset: Secondary | ICD-10-CM | POA: Diagnosis not present

## 2017-03-15 DIAGNOSIS — M1A00X0 Idiopathic chronic gout, unspecified site, without tophus (tophi): Secondary | ICD-10-CM | POA: Diagnosis not present

## 2017-03-17 DIAGNOSIS — H2513 Age-related nuclear cataract, bilateral: Secondary | ICD-10-CM | POA: Diagnosis not present

## 2017-03-17 NOTE — Discharge Instructions (Signed)
Cataract Surgery, Care After °Refer to this sheet in the next few weeks. These instructions provide you with information about caring for yourself after your procedure. Your health care provider may also give you more specific instructions. Your treatment has been planned according to current medical practices, but problems sometimes occur. Call your health care provider if you have any problems or questions after your procedure. °What can I expect after the procedure? °After the procedure, it is common to have: °· Itching. °· Discomfort. °· Fluid discharge. °· Sensitivity to light and to touch. °· Bruising. °Follow these instructions at home: °Eye Care  °· Check your eye every day for signs of infection. Watch for: °¨ Redness, swelling, or pain. °¨ Fluid, blood, or pus. °¨ Warmth. °¨ Bad smell. °Activity  °· Avoid strenuous activities, such as playing contact sports, for as long as told by your health care provider. °· Do not drive or operate heavy machinery until your health care provider approves. °· Do not bend or lift heavy objects . Bending increases pressure in the eye. You can walk, climb stairs, and do light household chores. °· Ask your health care provider when you can return to work. If you work in a dusty environment, you may be advised to wear protective eyewear for a period of time. °General instructions  °· Take or apply over-the-counter and prescription medicines only as told by your health care provider. This includes eye drops. °· Do not touch or rub your eyes. °· If you were given a protective shield, wear it as told by your health care provider. If you were not given a protective shield, wear sunglasses as told by your health care provider to protect your eyes. °· Keep the area around your eye clean and dry. Avoid swimming or allowing water to hit you directly in the face while showering until told by your health care provider. Keep soap and shampoo out of your eyes. °· Do not put a contact lens  into the affected eye or eyes until your health care provider approves. °· Keep all follow-up visits as told by your health care provider. This is important. °Contact a health care provider if: ° °· You have increased bruising around your eye. °· You have pain that is not helped with medicine. °· You have a fever. °· You have redness, swelling, or pain in your eye. °· You have fluid, blood, or pus coming from your incision. °· Your vision gets worse. °Get help right away if: °· You have sudden vision loss. °This information is not intended to replace advice given to you by your health care provider. Make sure you discuss any questions you have with your health care provider. °Document Released: 07/02/2005 Document Revised: 04/22/2016 Document Reviewed: 10/23/2015 °Elsevier Interactive Patient Education © 2017 [employer] ° ° ° ° °General Anesthesia, Adult, Care After °These instructions provide you with information about caring for yourself after your procedure. Your health care provider may also give you more specific instructions. Your treatment has been planned according to current medical practices, but problems sometimes occur. Call your health care provider if you have any problems or questions after your procedure. °What can I expect after the procedure? °After the procedure, it is common to have: °· Vomiting. °· A sore throat. °· Mental slowness. °It is common to feel: °· Nauseous. °· Cold or shivery. °· Sleepy. °· Tired. °· Sore or achy, even in parts of your body where you did not have surgery. °Follow these instructions at   home: °For at least 24 hours after the procedure:  °· Do not: °¨ Participate in activities where you could fall or become injured. °¨ Drive. °¨ Use heavy machinery. °¨ Drink alcohol. °¨ Take sleeping pills or medicines that cause drowsiness. °¨ Make important decisions or sign legal documents. °¨ Take care of children on your own. °· Rest. °Eating and drinking  °· If you vomit, drink  water, juice, or soup when you can drink without vomiting. °· Drink enough fluid to keep your urine clear or pale yellow. °· Make sure you have little or no nausea before eating solid foods. °· Follow the diet recommended by your health care provider. °General instructions  °· Have a responsible adult stay with you until you are awake and alert. °· Return to your normal activities as told by your health care provider. Ask your health care provider what activities are safe for you. °· Take over-the-counter and prescription medicines only as told by your health care provider. °· If you smoke, do not smoke without supervision. °· Keep all follow-up visits as told by your health care provider. This is important. °Contact a health care provider if: °· You continue to have nausea or vomiting at home, and medicines are not helpful. °· You cannot drink fluids or start eating again. °· You cannot urinate after 8-12 hours. °· You develop a skin rash. °· You have fever. °· You have increasing redness at the site of your procedure. °Get help right away if: °· You have difficulty breathing. °· You have chest pain. °· You have unexpected bleeding. °· You feel that you are having a life-threatening or urgent problem. °This information is not intended to replace advice given to you by your health care provider. Make sure you discuss any questions you have with your health care provider. °Document Released: 03/21/2001 Document Revised: 05/17/2016 Document Reviewed: 11/27/2015 °Elsevier Interactive Patient Education © 2017 [employer] ° °

## 2017-03-22 ENCOUNTER — Encounter
Admission: RE | Disposition: A | Discharge status: Home / Self Care | Payer: Self-pay | Source: Ambulatory Visit | Attending: Ophthalmology

## 2017-03-22 ENCOUNTER — Ambulatory Visit: Payer: PPO | Admitting: Student in an Organized Health Care Education/Training Program

## 2017-03-22 ENCOUNTER — Ambulatory Visit
Admission: RE | Admit: 2017-03-22 | Discharge: 2017-03-22 | Disposition: A | Discharge status: Home / Self Care | Payer: PPO | Source: Ambulatory Visit | Attending: Ophthalmology | Admitting: Ophthalmology

## 2017-03-22 DIAGNOSIS — E7800 Pure hypercholesterolemia, unspecified: Secondary | ICD-10-CM | POA: Diagnosis not present

## 2017-03-22 DIAGNOSIS — M1990 Unspecified osteoarthritis, unspecified site: Secondary | ICD-10-CM | POA: Insufficient documentation

## 2017-03-22 DIAGNOSIS — E1136 Type 2 diabetes mellitus with diabetic cataract: Secondary | ICD-10-CM | POA: Diagnosis not present

## 2017-03-22 DIAGNOSIS — M109 Gout, unspecified: Secondary | ICD-10-CM | POA: Insufficient documentation

## 2017-03-22 DIAGNOSIS — E079 Disorder of thyroid, unspecified: Secondary | ICD-10-CM | POA: Diagnosis not present

## 2017-03-22 DIAGNOSIS — F329 Major depressive disorder, single episode, unspecified: Secondary | ICD-10-CM | POA: Diagnosis not present

## 2017-03-22 DIAGNOSIS — Z90710 Acquired absence of both cervix and uterus: Secondary | ICD-10-CM | POA: Insufficient documentation

## 2017-03-22 DIAGNOSIS — Z9049 Acquired absence of other specified parts of digestive tract: Secondary | ICD-10-CM | POA: Insufficient documentation

## 2017-03-22 DIAGNOSIS — Z85828 Personal history of other malignant neoplasm of skin: Secondary | ICD-10-CM | POA: Diagnosis not present

## 2017-03-22 DIAGNOSIS — E1140 Type 2 diabetes mellitus with diabetic neuropathy, unspecified: Secondary | ICD-10-CM | POA: Diagnosis not present

## 2017-03-22 DIAGNOSIS — I10 Essential (primary) hypertension: Secondary | ICD-10-CM | POA: Diagnosis not present

## 2017-03-22 DIAGNOSIS — H2512 Age-related nuclear cataract, left eye: Secondary | ICD-10-CM | POA: Diagnosis not present

## 2017-03-22 DIAGNOSIS — Z905 Acquired absence of kidney: Secondary | ICD-10-CM | POA: Diagnosis not present

## 2017-03-22 DIAGNOSIS — Z87891 Personal history of nicotine dependence: Secondary | ICD-10-CM | POA: Diagnosis not present

## 2017-03-22 DIAGNOSIS — K219 Gastro-esophageal reflux disease without esophagitis: Secondary | ICD-10-CM | POA: Diagnosis not present

## 2017-03-22 DIAGNOSIS — F418 Other specified anxiety disorders: Secondary | ICD-10-CM | POA: Diagnosis not present

## 2017-03-22 HISTORY — PX: CATARACT EXTRACTION W/PHACO: SHX586

## 2017-03-22 LAB — GLUCOSE, CAPILLARY
GLUCOSE-CAPILLARY: 131 mg/dL — AB (ref 65–99)
Glucose-Capillary: 126 mg/dL — ABNORMAL HIGH (ref 65–99)

## 2017-03-22 SURGERY — PHACOEMULSIFICATION, CATARACT, WITH IOL INSERTION
Anesthesia: Monitor Anesthesia Care | Site: Eye | Laterality: Left | Wound class: Clean

## 2017-03-22 MED ORDER — FENTANYL CITRATE (PF) 100 MCG/2ML IJ SOLN
INTRAMUSCULAR | Status: DC | PRN
  Administered 2017-03-22: 50 ug via INTRAVENOUS

## 2017-03-22 MED ORDER — ARMC OPHTHALMIC DILATING DROPS
1.0000 "application " | OPHTHALMIC | Status: DC | PRN
  Administered 2017-03-22 (×3): 1 via OPHTHALMIC

## 2017-03-22 MED ORDER — MIDAZOLAM HCL 2 MG/2ML IJ SOLN
INTRAMUSCULAR | Status: DC | PRN
  Administered 2017-03-22: 2 mg via INTRAVENOUS

## 2017-03-22 MED ORDER — SODIUM HYALURONATE 23 MG/ML IO SOLN
INTRAOCULAR | Status: DC | PRN
  Administered 2017-03-22: 0.6 mL via INTRAOCULAR

## 2017-03-22 MED ORDER — EPINEPHRINE PF 1 MG/ML IJ SOLN
INTRAMUSCULAR | Status: DC | PRN
  Administered 2017-03-22: 86 mL via OPHTHALMIC

## 2017-03-22 MED ORDER — SODIUM HYALURONATE 10 MG/ML IO SOLN
INTRAOCULAR | Status: DC | PRN
  Administered 2017-03-22: 0.55 mL via INTRAOCULAR

## 2017-03-22 MED ORDER — LIDOCAINE HCL (PF) 2 % IJ SOLN
INTRAOCULAR | Status: DC | PRN
  Administered 2017-03-22: 1 mL via INTRAOCULAR

## 2017-03-22 MED ORDER — MOXIFLOXACIN HCL 0.5 % OP SOLN
OPHTHALMIC | Status: DC | PRN
  Administered 2017-03-22: .1 mL via OPHTHALMIC

## 2017-03-22 SURGICAL SUPPLY — 17 items
CANNULA ANT/CHMB 27GA (MISCELLANEOUS) ×3 IMPLANT
CUP MEDICINE 2OZ PLAST GRAD ST (MISCELLANEOUS) ×3 IMPLANT
DISSECTOR HYDRO NUCLEUS 50X22 (MISCELLANEOUS) ×3 IMPLANT
GLOVE BIO SURGEON STRL SZ8 (GLOVE) ×3 IMPLANT
GLOVE SURG LX 7.5 STRW (GLOVE) ×2
GLOVE SURG LX STRL 7.5 STRW (GLOVE) ×1 IMPLANT
GOWN STRL REUS W/ TWL LRG LVL3 (GOWN DISPOSABLE) ×2 IMPLANT
GOWN STRL REUS W/TWL LRG LVL3 (GOWN DISPOSABLE) ×4
LENS IOL TECNIS ITEC 22.5 (Intraocular Lens) ×3 IMPLANT
MARKER SKIN DUAL TIP RULER LAB (MISCELLANEOUS) ×3 IMPLANT
PACK CATARACT (MISCELLANEOUS) ×3 IMPLANT
PACK CATARACT BRASINGTON (MISCELLANEOUS) ×3 IMPLANT
PACK EYE AFTER SURG (MISCELLANEOUS) ×3 IMPLANT
SYR 3ML LL SCALE MARK (SYRINGE) ×3 IMPLANT
SYR TB 1ML LUER SLIP (SYRINGE) ×3 IMPLANT
WATER STERILE IRR 250ML POUR (IV SOLUTION) ×3 IMPLANT
WIPE NON LINTING 3.25X3.25 (MISCELLANEOUS) ×3 IMPLANT

## 2017-03-22 NOTE — H&P (Signed)
The History and Physical notes are on paper, have been signed, and are to be scanned.   I have examined the patient and there are no changes to the H&P.   [name] 03/22/2017 7:35 AM

## 2017-03-22 NOTE — Anesthesia Postprocedure Evaluation (Signed)
Anesthesia Post Note  Patient: Candice Cook  Procedure(s) Performed: Procedure(s) (LRB): CATARACT EXTRACTION PHACO AND INTRAOCULAR LENS PLACEMENT ([place]) left diabetic (Left)  Patient location during evaluation: PACU Anesthesia Type: MAC Level of consciousness: awake and alert and oriented Pain management: pain level controlled Vital Signs Assessment: post-procedure vital signs reviewed and stable Respiratory status: spontaneous breathing and nonlabored ventilation Cardiovascular status: stable Postop Assessment: no signs of nausea or vomiting and adequate PO intake Anesthetic complications: no    Estill Batten

## 2017-03-22 NOTE — Anesthesia Procedure Notes (Signed)
Procedure Name: MAC Performed by: Mayme Genta Pre-anesthesia Checklist: Patient identified, Emergency Drugs available, Suction available, Timeout performed and Patient being monitored Patient Re-evaluated:Patient Re-evaluated prior to inductionOxygen Delivery Method: Nasal cannula Placement Confirmation: positive ETCO2

## 2017-03-22 NOTE — Op Note (Signed)
OPERATIVE NOTE  Candice Cook [id number] 03/22/2017   PREOPERATIVE DIAGNOSIS:  Nuclear sclerotic cataract left eye.  H25.12   POSTOPERATIVE DIAGNOSIS:    Nuclear sclerotic cataract left eye.     PROCEDURE:  Phacoemusification with posterior chamber intraocular lens placement of the left eye   LENS:   Implant Name Type Inv. Item Serial No. Manufacturer Lot No. LRB No. Used  LENS IOL DIOP 22.5 - [id number] Intraocular Lens LENS IOL DIOP 22.5 [id number]   Left 1       PCB00 +22.5   ULTRASOUND TIME: 0 minutes 50.6 seconds.  CDE 6.85   SURGEON:  [name], MD, MPH   ANESTHESIA:  Topical with tetracaine drops augmented with 1% preservative-free intracameral lidocaine.  ESTIMATED BLOOD LOSS: <1 mL   COMPLICATIONS:  None.   DESCRIPTION OF PROCEDURE:  The patient was identified in the holding room and transported to the operating room and placed in the supine position under the operating microscope.  The left eye was identified as the operative eye and it was prepped and draped in the usual sterile ophthalmic fashion.   A 1.0 millimeter clear-corneal paracentesis was made at the 5:00 position. 0.5 ml of preservative-free 1% lidocaine with epinephrine was injected into the anterior chamber.  The anterior chamber was filled with Healon 5 viscoelastic.  A 2.4 millimeter keratome was used to make a near-clear corneal incision at the 2:00 position.  A curvilinear capsulorrhexis was made with a cystotome and capsulorrhexis forceps.  Balanced salt solution was used to hydrodissect and hydrodelineate the nucleus.   Phacoemulsification was then used in stop and chop fashion to remove the lens nucleus and epinucleus.  The remaining cortex was then removed using the irrigation and aspiration handpiece. Healon was then placed into the capsular bag to distend it for lens placement.  A lens was then injected into the capsular bag.  The remaining viscoelastic was aspirated.   Wounds were hydrated  with balanced salt solution.  The anterior chamber was inflated to a physiologic pressure with balanced salt solution.  Intracameral vigamox 0.1 mL undiltued was injected into the eye and a drop placed onto the ocular surface.  No wound leaks were noted.  The patient was taken to the recovery room in stable condition without complications of anesthesia or surgery  [name] 03/22/2017, 8:44 AM

## 2017-03-22 NOTE — Anesthesia Preprocedure Evaluation (Addendum)
Anesthesia Evaluation  Patient identified by MRN, date of birth, ID band Patient awake    Reviewed: Allergy & Precautions, NPO status , Patient's Chart, lab work & pertinent test results  Airway Mallampati: I  TM Distance: >3 FB Neck ROM: Full    Dental  (+) Upper Dentures, Lower Dentures   Pulmonary former smoker,    Pulmonary exam normal        Cardiovascular hypertension, Pt. on medications Normal cardiovascular exam     Neuro/Psych PSYCHIATRIC DISORDERS Anxiety Depression negative neurological ROS     GI/Hepatic GERD  ,  Endo/Other  diabetes, Type 2, Oral Hypoglycemic AgentsHypothyroidism   Renal/GU      Musculoskeletal  (+) Arthritis ,   Abdominal   Peds  Hematology negative hematology ROS (+)   Anesthesia Other Findings   Reproductive/Obstetrics                            Anesthesia Physical Anesthesia Plan  ASA: III  Anesthesia Plan: MAC   Post-op Pain Management:    Induction: Intravenous  Airway Management Planned:   Additional Equipment:   Intra-op Plan:   Post-operative Plan:   Informed Consent: I have reviewed the patients History and Physical, chart, labs and discussed the procedure including the risks, benefits and alternatives for the proposed anesthesia with the patient or authorized representative who has indicated his/her understanding and acceptance.     Plan Discussed with: CRNA  Anesthesia Plan Comments:         Anesthesia Quick Evaluation

## 2017-03-22 NOTE — Transfer of Care (Signed)
Immediate Anesthesia Transfer of Care Note  Patient: Candice Cook  Procedure(s) Performed: Procedure(s) with comments: CATARACT EXTRACTION PHACO AND INTRAOCULAR LENS PLACEMENT ([place]) left diabetic (Left) - Diabetic  Patient Location: PACU  Anesthesia Type: MAC  Level of Consciousness: awake, alert  and patient cooperative  Airway and Oxygen Therapy: Patient Spontanous Breathing and Patient connected to supplemental oxygen  Post-op Assessment: Post-op Vital signs reviewed, Patient's Cardiovascular Status Stable, Respiratory Function Stable, Patent Airway and No signs of Nausea or vomiting  Post-op Vital Signs: Reviewed and stable  Complications: No apparent anesthesia complications

## 2017-03-23 ENCOUNTER — Encounter: Payer: Self-pay | Admitting: Ophthalmology

## 2017-05-05 DIAGNOSIS — H2511 Age-related nuclear cataract, right eye: Secondary | ICD-10-CM | POA: Diagnosis not present

## 2017-05-13 NOTE — Discharge Instructions (Signed)
Cataract Surgery, Care After °Refer to this sheet in the next few weeks. These instructions provide you with information about caring for yourself after your procedure. Your health care provider may also give you more specific instructions. Your treatment has been planned according to current medical practices, but problems sometimes occur. Call your health care provider if you have any problems or questions after your procedure. °What can I expect after the procedure? °After the procedure, it is common to have: °· Itching. °· Discomfort. °· Fluid discharge. °· Sensitivity to light and to touch. °· Bruising. °Follow these instructions at home: °Eye Care  °· Check your eye every day for signs of infection. Watch for: °¨ Redness, swelling, or pain. °¨ Fluid, blood, or pus. °¨ Warmth. °¨ Bad smell. °Activity  °· Avoid strenuous activities, such as playing contact sports, for as long as told by your health care provider. °· Do not drive or operate heavy machinery until your health care provider approves. °· Do not bend or lift heavy objects . Bending increases pressure in the eye. You can walk, climb stairs, and do light household chores. °· Ask your health care provider when you can return to work. If you work in a dusty environment, you may be advised to wear protective eyewear for a period of time. °General instructions  °· Take or apply over-the-counter and prescription medicines only as told by your health care provider. This includes eye drops. °· Do not touch or rub your eyes. °· If you were given a protective shield, wear it as told by your health care provider. If you were not given a protective shield, wear sunglasses as told by your health care provider to protect your eyes. °· Keep the area around your eye clean and dry. Avoid swimming or allowing water to hit you directly in the face while showering until told by your health care provider. Keep soap and shampoo out of your eyes. °· Do not put a contact lens  into the affected eye or eyes until your health care provider approves. °· Keep all follow-up visits as told by your health care provider. This is important. °Contact a health care provider if: ° °· You have increased bruising around your eye. °· You have pain that is not helped with medicine. °· You have a fever. °· You have redness, swelling, or pain in your eye. °· You have fluid, blood, or pus coming from your incision. °· Your vision gets worse. °Get help right away if: °· You have sudden vision loss. °This information is not intended to replace advice given to you by your health care provider. Make sure you discuss any questions you have with your health care provider. °Document Released: 07/02/2005 Document Revised: 04/22/2016 Document Reviewed: 10/23/2015 °Elsevier Interactive Patient Education © 2017 [employer] ° ° ° ° °General Anesthesia, Adult, Care After °These instructions provide you with information about caring for yourself after your procedure. Your health care provider may also give you more specific instructions. Your treatment has been planned according to current medical practices, but problems sometimes occur. Call your health care provider if you have any problems or questions after your procedure. °What can I expect after the procedure? °After the procedure, it is common to have: °· Vomiting. °· A sore throat. °· Mental slowness. °It is common to feel: °· Nauseous. °· Cold or shivery. °· Sleepy. °· Tired. °· Sore or achy, even in parts of your body where you did not have surgery. °Follow these instructions at   home: °For at least 24 hours after the procedure:  °· Do not: °¨ Participate in activities where you could fall or become injured. °¨ Drive. °¨ Use heavy machinery. °¨ Drink alcohol. °¨ Take sleeping pills or medicines that cause drowsiness. °¨ Make important decisions or sign legal documents. °¨ Take care of children on your own. °· Rest. °Eating and drinking  °· If you vomit, drink  water, juice, or soup when you can drink without vomiting. °· Drink enough fluid to keep your urine clear or pale yellow. °· Make sure you have little or no nausea before eating solid foods. °· Follow the diet recommended by your health care provider. °General instructions  °· Have a responsible adult stay with you until you are awake and alert. °· Return to your normal activities as told by your health care provider. Ask your health care provider what activities are safe for you. °· Take over-the-counter and prescription medicines only as told by your health care provider. °· If you smoke, do not smoke without supervision. °· Keep all follow-up visits as told by your health care provider. This is important. °Contact a health care provider if: °· You continue to have nausea or vomiting at home, and medicines are not helpful. °· You cannot drink fluids or start eating again. °· You cannot urinate after 8-12 hours. °· You develop a skin rash. °· You have fever. °· You have increasing redness at the site of your procedure. °Get help right away if: °· You have difficulty breathing. °· You have chest pain. °· You have unexpected bleeding. °· You feel that you are having a life-threatening or urgent problem. °This information is not intended to replace advice given to you by your health care provider. Make sure you discuss any questions you have with your health care provider. °Document Released: 03/21/2001 Document Revised: 05/17/2016 Document Reviewed: 11/27/2015 °Elsevier Interactive Patient Education © 2017 [employer] ° °

## 2017-05-17 ENCOUNTER — Ambulatory Visit: Payer: PPO | Admitting: Anesthesiology

## 2017-05-17 ENCOUNTER — Encounter
Admission: RE | Disposition: A | Discharge status: Home / Self Care | Payer: Self-pay | Source: Ambulatory Visit | Attending: Ophthalmology

## 2017-05-17 ENCOUNTER — Ambulatory Visit
Admission: RE | Admit: 2017-05-17 | Discharge: 2017-05-17 | Disposition: A | Discharge status: Home / Self Care | Payer: PPO | Source: Ambulatory Visit | Attending: Ophthalmology | Admitting: Ophthalmology

## 2017-05-17 DIAGNOSIS — K219 Gastro-esophageal reflux disease without esophagitis: Secondary | ICD-10-CM | POA: Insufficient documentation

## 2017-05-17 DIAGNOSIS — Z791 Long term (current) use of non-steroidal anti-inflammatories (NSAID): Secondary | ICD-10-CM | POA: Diagnosis not present

## 2017-05-17 DIAGNOSIS — M109 Gout, unspecified: Secondary | ICD-10-CM | POA: Insufficient documentation

## 2017-05-17 DIAGNOSIS — Z905 Acquired absence of kidney: Secondary | ICD-10-CM | POA: Diagnosis not present

## 2017-05-17 DIAGNOSIS — Z7984 Long term (current) use of oral hypoglycemic drugs: Secondary | ICD-10-CM | POA: Insufficient documentation

## 2017-05-17 DIAGNOSIS — H2511 Age-related nuclear cataract, right eye: Secondary | ICD-10-CM | POA: Insufficient documentation

## 2017-05-17 DIAGNOSIS — E7800 Pure hypercholesterolemia, unspecified: Secondary | ICD-10-CM | POA: Diagnosis not present

## 2017-05-17 DIAGNOSIS — Z87891 Personal history of nicotine dependence: Secondary | ICD-10-CM | POA: Insufficient documentation

## 2017-05-17 DIAGNOSIS — Z79899 Other long term (current) drug therapy: Secondary | ICD-10-CM | POA: Insufficient documentation

## 2017-05-17 DIAGNOSIS — F418 Other specified anxiety disorders: Secondary | ICD-10-CM | POA: Insufficient documentation

## 2017-05-17 DIAGNOSIS — I10 Essential (primary) hypertension: Secondary | ICD-10-CM | POA: Diagnosis not present

## 2017-05-17 DIAGNOSIS — E1140 Type 2 diabetes mellitus with diabetic neuropathy, unspecified: Secondary | ICD-10-CM | POA: Diagnosis not present

## 2017-05-17 DIAGNOSIS — Z7983 Long term (current) use of bisphosphonates: Secondary | ICD-10-CM | POA: Insufficient documentation

## 2017-05-17 DIAGNOSIS — Z7982 Long term (current) use of aspirin: Secondary | ICD-10-CM | POA: Diagnosis not present

## 2017-05-17 DIAGNOSIS — E1136 Type 2 diabetes mellitus with diabetic cataract: Secondary | ICD-10-CM | POA: Insufficient documentation

## 2017-05-17 DIAGNOSIS — Z79890 Hormone replacement therapy (postmenopausal): Secondary | ICD-10-CM | POA: Diagnosis not present

## 2017-05-17 HISTORY — PX: CATARACT EXTRACTION W/PHACO: SHX586

## 2017-05-17 LAB — GLUCOSE, CAPILLARY
GLUCOSE-CAPILLARY: 116 mg/dL — AB (ref 65–99)
Glucose-Capillary: 123 mg/dL — ABNORMAL HIGH (ref 65–99)

## 2017-05-17 SURGERY — PHACOEMULSIFICATION, CATARACT, WITH IOL INSERTION
Anesthesia: Monitor Anesthesia Care | Site: Eye | Laterality: Right | Wound class: Clean

## 2017-05-17 MED ORDER — SODIUM HYALURONATE 10 MG/ML IO SOLN
INTRAOCULAR | Status: DC | PRN
  Administered 2017-05-17: 0.55 mL via INTRAOCULAR

## 2017-05-17 MED ORDER — FENTANYL CITRATE (PF) 100 MCG/2ML IJ SOLN
INTRAMUSCULAR | Status: DC | PRN
  Administered 2017-05-17: 50 ug via INTRAVENOUS

## 2017-05-17 MED ORDER — SODIUM HYALURONATE 23 MG/ML IO SOLN
INTRAOCULAR | Status: DC | PRN
  Administered 2017-05-17: 0.6 mL via INTRAOCULAR

## 2017-05-17 MED ORDER — MIDAZOLAM HCL 2 MG/2ML IJ SOLN
INTRAMUSCULAR | Status: DC | PRN
  Administered 2017-05-17: 2 mg via INTRAVENOUS

## 2017-05-17 MED ORDER — MOXIFLOXACIN HCL 0.5 % OP SOLN
OPHTHALMIC | Status: DC | PRN
  Administered 2017-05-17: 0.2 mL via OPHTHALMIC

## 2017-05-17 MED ORDER — EPINEPHRINE PF 1 MG/ML IJ SOLN
INTRAMUSCULAR | Status: DC | PRN
  Administered 2017-05-17: 76 mL via OPHTHALMIC

## 2017-05-17 MED ORDER — LIDOCAINE HCL (PF) 2 % IJ SOLN
INTRAOCULAR | Status: DC | PRN
  Administered 2017-05-17: 1 mL via INTRAOCULAR

## 2017-05-17 MED ORDER — ARMC OPHTHALMIC DILATING DROPS
1.0000 "application " | OPHTHALMIC | Status: DC | PRN
  Administered 2017-05-17 (×3): 1 via OPHTHALMIC

## 2017-05-17 SURGICAL SUPPLY — 16 items
CANNULA ANT/CHMB 27GA (MISCELLANEOUS) ×3 IMPLANT
DISSECTOR HYDRO NUCLEUS 50X22 (MISCELLANEOUS) ×3 IMPLANT
GLOVE BIO SURGEON STRL SZ8 (GLOVE) ×3 IMPLANT
GLOVE SURG LX 7.5 STRW (GLOVE) ×2
GLOVE SURG LX STRL 7.5 STRW (GLOVE) ×1 IMPLANT
GOWN STRL REUS W/ TWL LRG LVL3 (GOWN DISPOSABLE) ×2 IMPLANT
GOWN STRL REUS W/TWL LRG LVL3 (GOWN DISPOSABLE) ×4
LENS IOL TECNIS ITEC 22.0 (Intraocular Lens) ×3 IMPLANT
MARKER SKIN DUAL TIP RULER LAB (MISCELLANEOUS) ×3 IMPLANT
PACK CATARACT (MISCELLANEOUS) ×3 IMPLANT
PACK DR. KING ARMS (PACKS) ×3 IMPLANT
PACK EYE AFTER SURG (MISCELLANEOUS) ×3 IMPLANT
SYR 3ML LL SCALE MARK (SYRINGE) ×3 IMPLANT
SYR TB 1ML LUER SLIP (SYRINGE) ×3 IMPLANT
WATER STERILE IRR 500ML POUR (IV SOLUTION) ×3 IMPLANT
WIPE NON LINTING 3.25X3.25 (MISCELLANEOUS) ×3 IMPLANT

## 2017-05-17 NOTE — Anesthesia Procedure Notes (Signed)
Procedure Name: MAC Performed by: Mayme Genta Pre-anesthesia Checklist: Patient identified, Emergency Drugs available, Suction available, Timeout performed and Patient being monitored Patient Re-evaluated:Patient Re-evaluated prior to inductionOxygen Delivery Method: Nasal cannula Placement Confirmation: positive ETCO2

## 2017-05-17 NOTE — Anesthesia Preprocedure Evaluation (Signed)
Anesthesia Evaluation  Patient identified by MRN, date of birth, ID band Patient awake    Reviewed: Allergy & Precautions, NPO status , Patient's Chart, lab work & pertinent test results  Airway Mallampati: I  TM Distance: >3 FB Neck ROM: Full    Dental  (+) Upper Dentures, Lower Dentures   Pulmonary former smoker,    Pulmonary exam normal        Cardiovascular hypertension, Pt. on medications Normal cardiovascular exam     Neuro/Psych PSYCHIATRIC DISORDERS Anxiety Depression negative neurological ROS     GI/Hepatic GERD  ,  Endo/Other  diabetes, Type 2, Oral Hypoglycemic AgentsHypothyroidism   Renal/GU      Musculoskeletal  (+) Arthritis ,   Abdominal   Peds  Hematology negative hematology ROS (+)   Anesthesia Other Findings   Reproductive/Obstetrics                            Anesthesia Physical Anesthesia Plan  ASA: III  Anesthesia Plan: MAC   Post-op Pain Management:    Induction: Intravenous  Airway Management Planned:   Additional Equipment:   Intra-op Plan:   Post-operative Plan:   Informed Consent: I have reviewed the patients History and Physical, chart, labs and discussed the procedure including the risks, benefits and alternatives for the proposed anesthesia with the patient or authorized representative who has indicated his/her understanding and acceptance.     Plan Discussed with: CRNA  Anesthesia Plan Comments:         Anesthesia Quick Evaluation  

## 2017-05-17 NOTE — Anesthesia Postprocedure Evaluation (Signed)
Anesthesia Post Note  Patient: Candice Cook  Procedure(s) Performed: Procedure(s) (LRB): CATARACT EXTRACTION PHACO AND INTRAOCULAR LENS PLACEMENT ([facility]) Right diabetic (Right)  Patient location during evaluation: PACU Anesthesia Type: MAC Level of consciousness: awake and alert and oriented Pain management: pain level controlled Vital Signs Assessment: post-procedure vital signs reviewed and stable Respiratory status: spontaneous breathing and nonlabored ventilation Cardiovascular status: stable Postop Assessment: no signs of nausea or vomiting and adequate PO intake Anesthetic complications: no    Estill Batten

## 2017-05-17 NOTE — Transfer of Care (Signed)
Immediate Anesthesia Transfer of Care Note  Patient: Candice Cook  Procedure(s) Performed: Procedure(s): CATARACT EXTRACTION PHACO AND INTRAOCULAR LENS PLACEMENT ([facility]) Right diabetic (Right)  Patient Location: PACU  Anesthesia Type: MAC  Level of Consciousness: awake, alert  and patient cooperative  Airway and Oxygen Therapy: Patient Spontanous Breathing and Patient connected to supplemental oxygen  Post-op Assessment: Post-op Vital signs reviewed, Patient's Cardiovascular Status Stable, Respiratory Function Stable, Patent Airway and No signs of Nausea or vomiting  Post-op Vital Signs: Reviewed and stable  Complications: No apparent anesthesia complications

## 2017-05-17 NOTE — H&P (Signed)
The History and Physical notes are on paper, have been signed, and are to be scanned.   I have examined the patient and there are no changes to the H&P.   Candice Cook 05/17/2017 9:27 AM

## 2017-05-17 NOTE — Op Note (Signed)
OPERATIVE NOTE  [name] [id number] 05/17/2017   PREOPERATIVE DIAGNOSIS:  Nuclear sclerotic cataract right eye.  H25.11   POSTOPERATIVE DIAGNOSIS:    Nuclear sclerotic cataract right eye.     PROCEDURE:  Phacoemusification with posterior chamber intraocular lens placement of the right eye   LENS:   Implant Name Type Inv. Item Serial No. Manufacturer Lot No. LRB No. Used  IMPLANT RECORD             1       PCB00 +22.0   ULTRASOUND TIME: 0 minutes 33 seconds.  CDE 5.15   SURGEON:  [name], MD, MPH  ANESTHESIOLOGIST: Anesthesiologist: Estill Batten, DO CRNA: Mayme Genta, CRNA   ANESTHESIA:  Topical with tetracaine drops augmented with 1% preservative-free intracameral lidocaine.  ESTIMATED BLOOD LOSS: less than 1 mL.   COMPLICATIONS:  None.   DESCRIPTION OF PROCEDURE:  The patient was identified in the holding room and transported to the operating room and placed in the supine position under the operating microscope.  The right eye was identified as the operative eye and it was prepped and draped in the usual sterile ophthalmic fashion.   A 1.0 millimeter clear-corneal paracentesis was made at the 10:30 position. 0.5 ml of preservative-free 1% lidocaine with epinephrine was injected into the anterior chamber.  The anterior chamber was filled with Healon 5 viscoelastic.  A 2.4 millimeter keratome was used to make a near-clear corneal incision at the 8:00 position.  A curvilinear capsulorrhexis was made with a cystotome and capsulorrhexis forceps.  Balanced salt solution was used to hydrodissect and hydrodelineate the nucleus.   Phacoemulsification was then used in stop and chop fashion to remove the lens nucleus and epinucleus.  The remaining cortex was then removed using the irrigation and aspiration handpiece. Healon was then placed into the capsular bag to distend it for lens placement.  A lens was then injected into the capsular bag.  The remaining viscoelastic  was aspirated.   Wounds were hydrated with balanced salt solution.  The anterior chamber was inflated to a physiologic pressure with balanced salt solution.   Intracameral vigamox 0.1 mL undiluted was injected into the eye and a drop placed onto the ocular surface.  No wound leaks were noted.  The patient was taken to the recovery room in stable condition without complications of anesthesia or surgery  [name] 05/17/2017, 10:03 AM

## 2017-05-18 ENCOUNTER — Encounter: Payer: Self-pay | Admitting: Ophthalmology

## 2017-06-08 IMAGING — MR MR CERVICAL SPINE W/O CM
5 series · 34 of 48 positions shown · non-contrast
Comparison: None.

CLINICAL DATA: Right cervical radiculopathy. Right neck and
shoulder pain after falling off of a bed 6 weeks ago, hitting the
head and landing on the right shoulder. Decreased strength in the
hand. Some numbness in the fingers.

EXAM:
MRI CERVICAL SPINE WITHOUT CONTRAST
TECHNIQUE: Multiplanar, multisequence MR imaging of the cervical spine was
performed. No intravenous contrast was administered.

[Series 2: T2 · sagittal · 3.0mm · 0.56mm/px · 8 of 13 slices shown (1 of 2)]
[im 1/13]
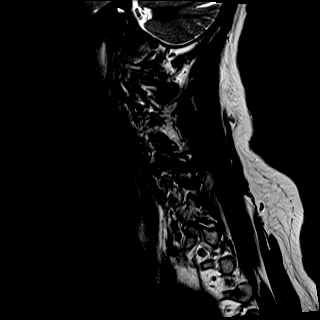
[im 2/13]
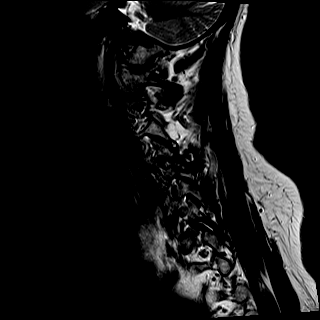
[im 4/13]
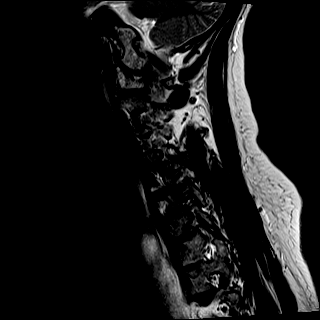
[im 6/13]
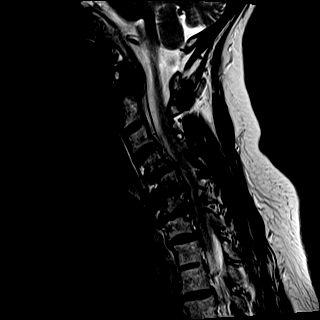
[im 7/13]
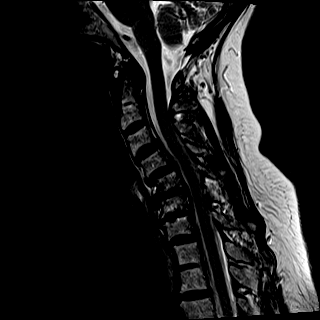
[im 9/13]
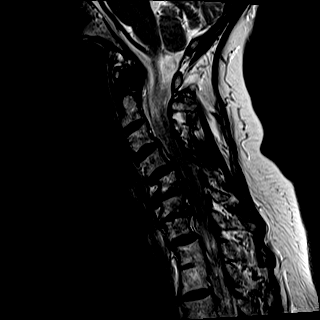
[im 11/13]
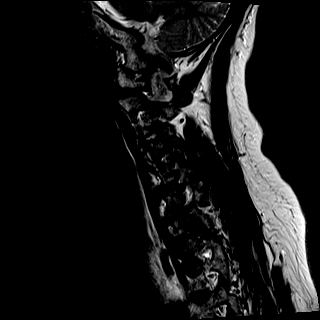
[im 13/13]
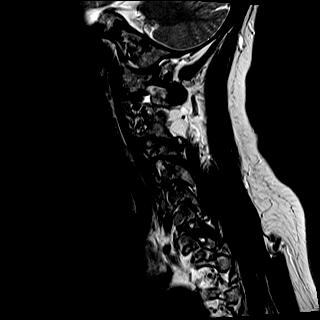

[Series 3: T1 · sagittal · 3.0mm · 0.70mm/px · 7 of 13 slices shown]
[im 1/13]
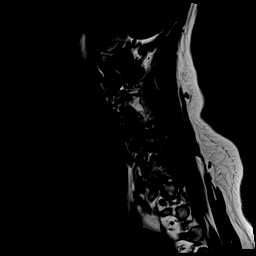
[im 3/13]
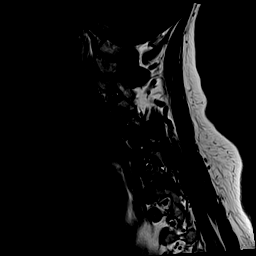
[im 5/13]
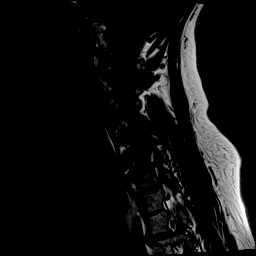
[im 7/13]
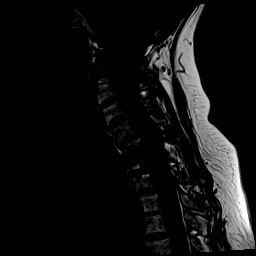
[im 9/13]
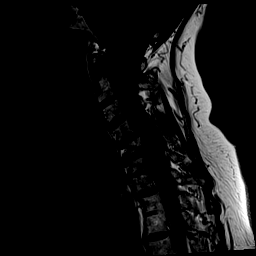
[im 11/13]
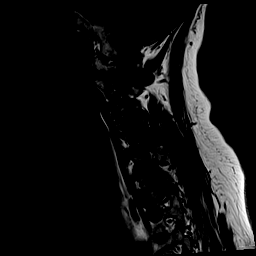
[im 13/13]
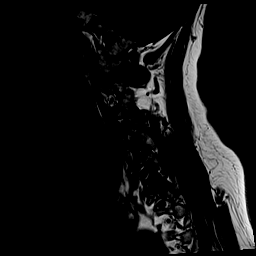

[Series 4: STIR · sagittal · 3.0mm · 0.35mm/px · 7 of 13 slices shown]
[im 1/13]
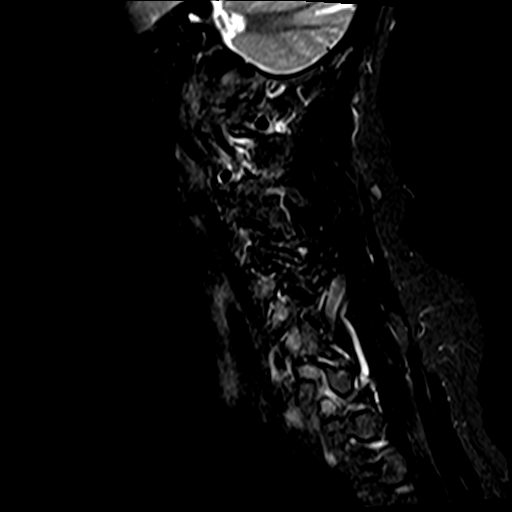
[im 3/13]
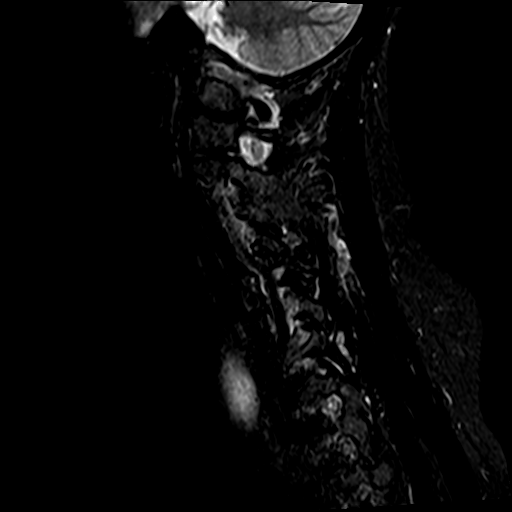
[im 5/13]
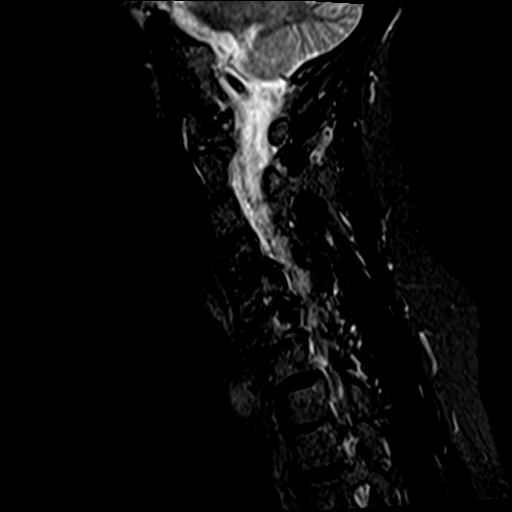
[im 7/13]
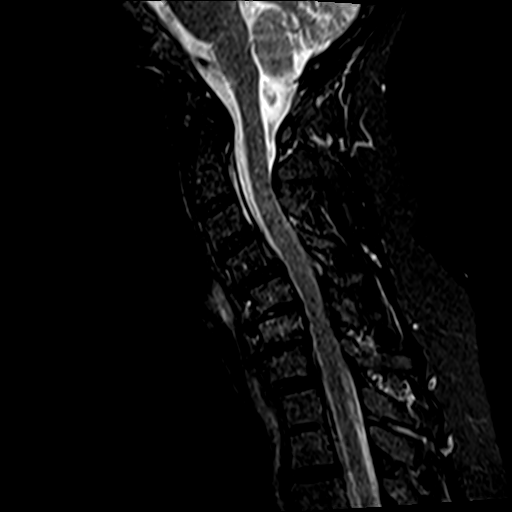
[im 9/13]
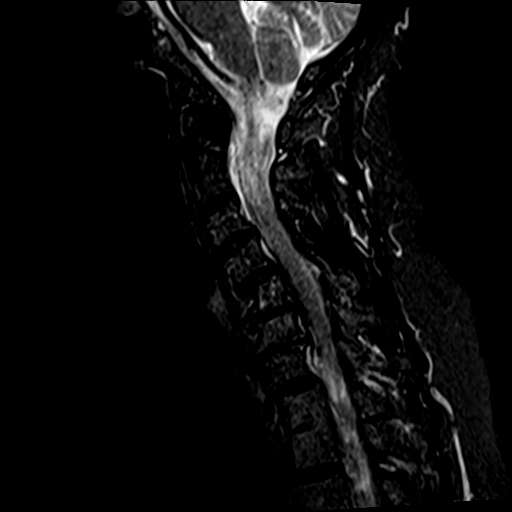
[im 11/13]
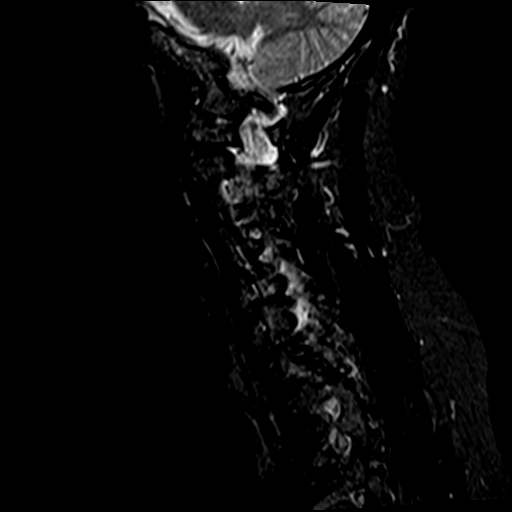
[im 13/13]
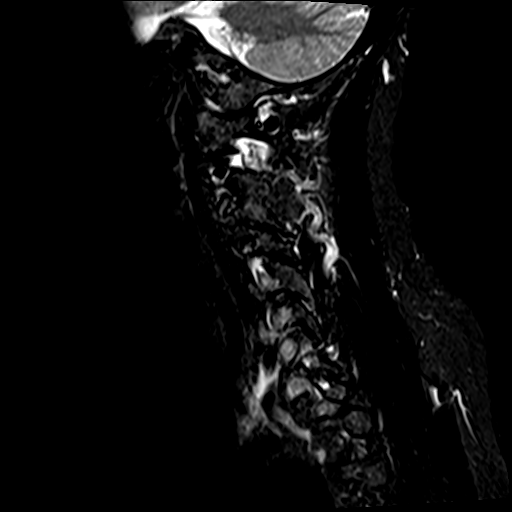

[Series 5: T2 · axial · 3.0mm · 0.62mm/px · z∈[-86,-8]mm · 9 of 23 slices shown (2 of 2)]
[im 1/23]
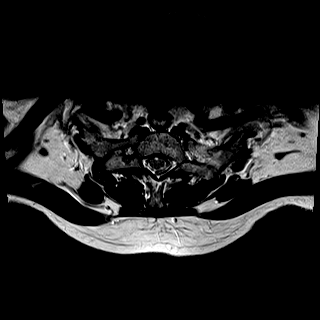
[im 4/23]
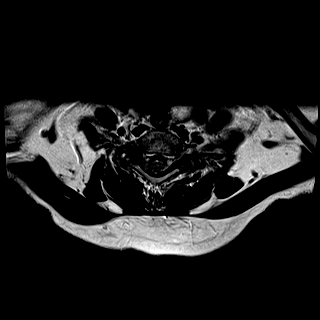
[im 8/23]
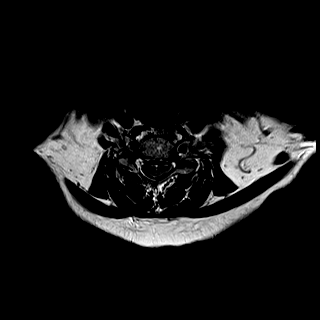
[im 10/23]
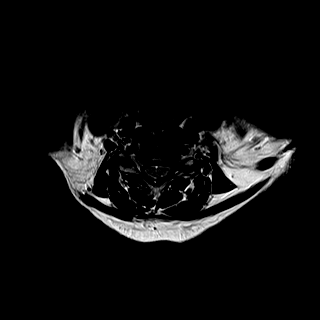
[im 12/23]
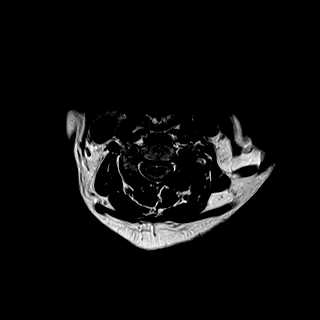
[im 13/23]
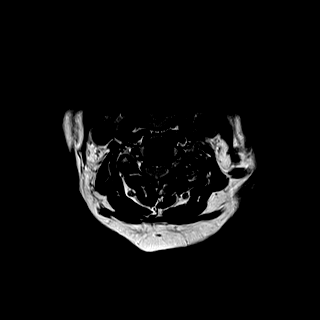
[im 15/23]
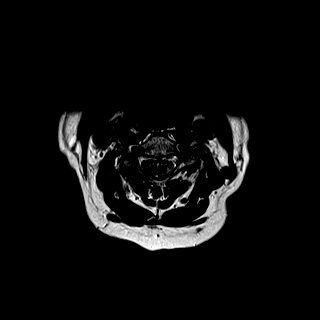
[im 19/23]
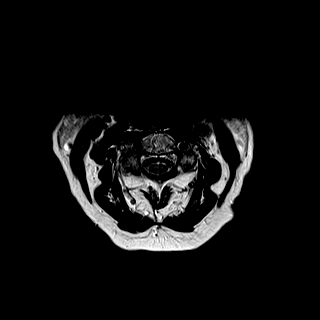
[im 23/23]
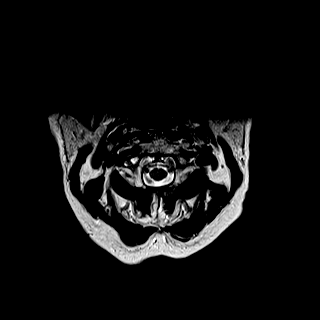

[Series 6: mpgr ax · axial · 3.0mm · 0.37mm/px · z∈[-75,-50]mm · 3 of 23 slices shown]
[im 1/23]
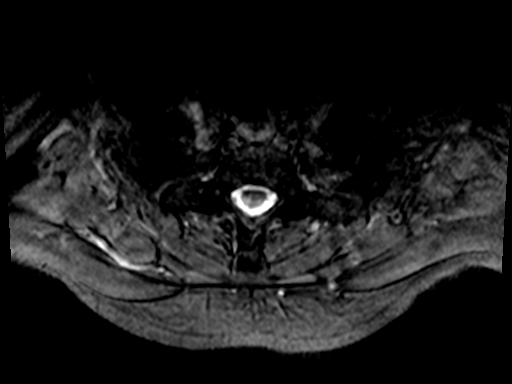
[im 4/23]
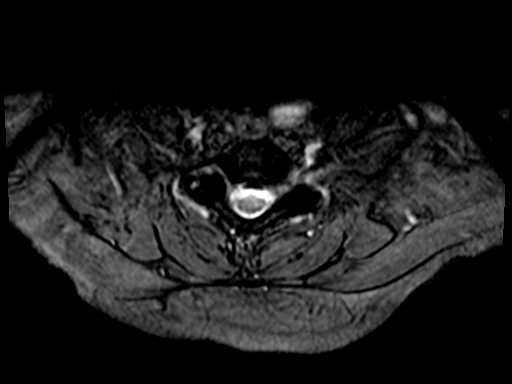
[im 8/23]
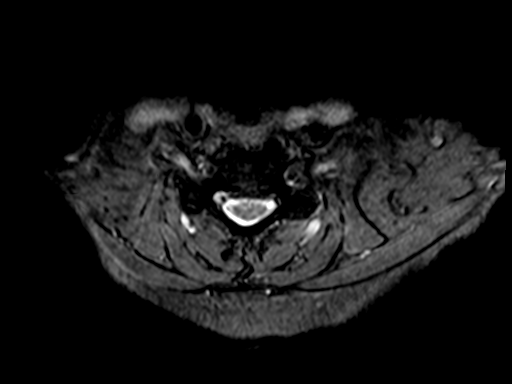

[34 of 48 positions shown; findings below may reference images not displayed]

FINDINGS: Alignment: Reversal the normal cervical lordosis. Grade 1
anterolisthesis of C4 on C5 and C7 on T1.

Vertebrae: Congenital incomplete segmentation at T1-2. Right facet
arthritis at C7-T1 with periarticular edema. No evidence of cervical
spine fracture. Degenerative endplate changes C5-C7.

Cord: Normal signal and morphology.

Posterior Fossa, vertebral arteries, paraspinal tissues:
Approximately 2.5 cm T2 hyperintense left thyroid nodule.

Disc levels:

C2-3: Mild facet arthrosis with ankylosis on the right. No stenosis.

C3-4: Small central disc protrusion without spinal stenosis.
Uncovertebral spurring and severe right and mild left facet
arthrosis result in mild right neural foraminal stenosis.

C4-5: Listhesis with disc uncovering, central pseudo disc extrusion,
and mild right and severe left facet arthrosis result in mild spinal
stenosis and mild left neural foraminal stenosis.

C5-6: Moderate disc space narrowing. Disc bulging and uncovertebral
spurring result in mild left greater than right neural foraminal
stenosis without spinal stenosis.

C6-7: Moderate disc space narrowing. Disc bulging and uncovertebral
spurring result in moderate right and mild left neural foraminal
stenosis without spinal stenosis.

C7-T1: Listhesis with disc uncovering and severe right greater than
left facet arthrosis result in severe right and moderate left neural
foraminal stenosis with potential C8 nerve impingement. No spinal
stenosis.
IMPRESSION: 1. Severe facet arthritis at C7-T1 with grade 1 anterolisthesis and
severe right and moderate left neural foraminal stenosis. Potential
C8 nerve impingement.
2. Moderate right and mild left neural foraminal stenosis at C6-7.
3. Mild spinal stenosis at C4-5 with grade 1 anterolisthesis due to
facet disease.

## 2017-06-23 DIAGNOSIS — R413 Other amnesia: Secondary | ICD-10-CM | POA: Diagnosis not present

## 2017-06-23 DIAGNOSIS — F39 Unspecified mood [affective] disorder: Secondary | ICD-10-CM | POA: Diagnosis not present

## 2017-06-30 DIAGNOSIS — Z961 Presence of intraocular lens: Secondary | ICD-10-CM | POA: Diagnosis not present

## 2017-09-15 DIAGNOSIS — G301 Alzheimer's disease with late onset: Secondary | ICD-10-CM | POA: Diagnosis not present

## 2017-09-15 DIAGNOSIS — K529 Noninfective gastroenteritis and colitis, unspecified: Secondary | ICD-10-CM | POA: Insufficient documentation

## 2017-09-15 DIAGNOSIS — N183 Chronic kidney disease, stage 3 (moderate): Secondary | ICD-10-CM | POA: Diagnosis not present

## 2017-09-15 DIAGNOSIS — G63 Polyneuropathy in diseases classified elsewhere: Secondary | ICD-10-CM | POA: Diagnosis not present

## 2017-09-15 DIAGNOSIS — E538 Deficiency of other specified B group vitamins: Secondary | ICD-10-CM | POA: Diagnosis not present

## 2017-09-15 DIAGNOSIS — M1A00X0 Idiopathic chronic gout, unspecified site, without tophus (tophi): Secondary | ICD-10-CM | POA: Diagnosis not present

## 2017-09-15 DIAGNOSIS — Z79899 Other long term (current) drug therapy: Secondary | ICD-10-CM | POA: Diagnosis not present

## 2017-09-15 DIAGNOSIS — F5101 Primary insomnia: Secondary | ICD-10-CM | POA: Diagnosis not present

## 2017-09-15 DIAGNOSIS — Z8639 Personal history of other endocrine, nutritional and metabolic disease: Secondary | ICD-10-CM | POA: Diagnosis not present

## 2017-09-15 DIAGNOSIS — E1142 Type 2 diabetes mellitus with diabetic polyneuropathy: Secondary | ICD-10-CM | POA: Diagnosis not present

## 2017-09-15 DIAGNOSIS — N182 Chronic kidney disease, stage 2 (mild): Secondary | ICD-10-CM | POA: Insufficient documentation

## 2017-09-15 DIAGNOSIS — E782 Mixed hyperlipidemia: Secondary | ICD-10-CM | POA: Diagnosis not present

## 2017-09-15 DIAGNOSIS — I10 Essential (primary) hypertension: Secondary | ICD-10-CM | POA: Diagnosis not present

## 2017-09-15 DIAGNOSIS — K219 Gastro-esophageal reflux disease without esophagitis: Secondary | ICD-10-CM | POA: Diagnosis not present

## 2017-10-12 NOTE — Progress Notes (Signed)
9:Accident [birth year] [id number]  Referring provider: Ezequiel Kayser, MD [facility] [place], [place] [address]  Chief Complaint  Patient presents with  . Urinary Incontinence    Urge   referred by Dr. Dorthula Perfect    HPI: Patient is a 76 year old Caucasian female with incontinence who presents today as a referral from Dr. Raechel Ache.    Background history Patient presented to [place] at the request of her primary care physician, Dr. Dorthula Perfect,  for urinary incontinence.  Patient was given a trial of Myrbetriq 25 mg daily. She is on the medication for 2 weeks.  She states that she noted significant improvement in her incontinence, but then she started experiencing uncontrollable tremors and had to discontinue the medication.  She states she has been suffering with incontinence for longer than 1 year.  She endorses symptoms of both stress urinary incontinence and urge incontinence.   She wears 3 incontinence pads daily. She states she has a variable leak volume.  She also experiences nocturia 1, but she does not suffer from nighttime incontinence.  She does not suffer with constipation, but she states that she has stools more on the loose side.  She denies any gross hematuria or history of urinary tract infections.  She was found to have a urinary tract infection on 03/23/2016.  It was positive for Escherichia coli and was sensitive to Augmentin.  She completed a 7 day course of Augmentin.  She did not note any improvement with the incontinence after completing the antibiotic or with the Vesicare.  Her PVR today was 51 mL.    She failed Myrbetriq and Toviaz.  She was interested in PTNS, but she did not return to start the treatments.   Today, she is complaining of frequency x 3, urgency is mild, nocturia x 1, incontinence with urge, stress and unawareness and intermittency. She is also having diarrhea.  Her PVR 0 mL. She denies dysuria, suprapubic pain and gross  hematuria. She denies any fevers, chills, nausea or vomiting. She has not noticed anything that makes her urinary symptoms worse. She has not noticed anything that makes her urinary symptoms better. She has had these symptoms for over one year.  She is going through 4 pads daily.    PMH: Past Medical History:  Diagnosis Date  . Anxiety and depression   . Arthritis   . Cancer ([place])    skin ca  . Diabetes mellitus without complication ([place])   . GERD (gastroesophageal reflux disease)   . Gout   . Heart murmur   . Hypercholesteremia   . Hypertension   . Mixed incontinence   . Thyroid condition     Surgical History: Past Surgical History:  Procedure Laterality Date  . ABDOMINAL HYSTERECTOMY    . CATARACT EXTRACTION W/PHACO Left 03/22/2017   Procedure: CATARACT EXTRACTION PHACO AND INTRAOCULAR LENS PLACEMENT ([place]) left diabetic;  Surgeon: Eulogio Bear, MD;  Location: [facility];  Service: Ophthalmology;  Laterality: Left;  Diabetic  . CATARACT EXTRACTION W/PHACO Right 05/17/2017   Procedure: CATARACT EXTRACTION PHACO AND INTRAOCULAR LENS PLACEMENT ([place]) Right diabetic;  Surgeon: Eulogio Bear, MD;  Location: [facility];  Service: Ophthalmology;  Laterality: Right;  . CHOLECYSTECTOMY    . KIDNEY SURGERY     gave daughter a kidney    Home Medications:  Allergies as of 10/14/2017      Reactions   Mirabegron Other (See Comments)  Severe tremor, headache   Metformin And Related Diarrhea      Medication List       Accurate as of 10/14/17  9:57 AM. Always use your most recent med list.          alendronate 70 MG tablet Commonly known as:  FOSAMAX Take 70 mg by mouth. Reported on 05/17/2016   allopurinol 300 MG tablet Commonly known as:  ZYLOPRIM Take 300 mg by mouth daily.   amoxicillin-clavulanate 875-125 MG tablet Commonly known as:  AUGMENTIN Take 1 tablet by mouth 2 (two) times daily.   aspirin EC 81 MG tablet Take 81 mg by mouth daily.     azithromycin 250 MG tablet Commonly known as:  ZITHROMAX Z-PAK Take 2 tablets first day and then 1 po a day for 4 days   calcium-vitamin D 500-200 MG-UNIT tablet Commonly known as:  OSCAL WITH D Take 1 tablet by mouth. Reported on 05/17/2016   Cyanocobalamin 2500 MCG Subl Place under the tongue.   donepezil 10 MG tablet Commonly known as:  ARICEPT Take 10 mg by mouth at bedtime.   fexofenadine-pseudoephedrine 180-240 MG 24 hr tablet Commonly known as:  ALLEGRA-D ALLERGY & CONGESTION Take 1 tablet by mouth daily.   gabapentin 300 MG capsule Commonly known as:  NEURONTIN Take by mouth.   glimepiride 2 MG tablet Commonly known as:  AMARYL Take 2 mg by mouth daily with breakfast.   indomethacin 25 MG capsule Commonly known as:  INDOCIN Take 25 mg by mouth. Reported on 05/17/2016   levothyroxine 75 MCG tablet Commonly known as:  SYNTHROID, LEVOTHROID Take 50 mcg by mouth. Reported on 04/23/2016   LIBERTY GLUCOSE CONTROL Normal Liqd Use as directed.   lisinopril 10 MG tablet Commonly known as:  PRINIVIL,ZESTRIL Take 1.5 mg by mouth daily.   lisinopril-hydrochlorothiazide 20-12.5 MG tablet Commonly known as:  PRINZIDE,ZESTORETIC Take by mouth.   meloxicam 7.5 MG tablet Commonly known as:  MOBIC Take 1 tablet (7.5 mg total) by mouth daily.   metFORMIN 500 MG tablet Commonly known as:  GLUCOPHAGE Take by mouth 2 (two) times daily with a meal. Reported on 05/17/2016   MYRBETRIQ 25 MG Tb24 tablet Generic drug:  mirabegron ER Reported on 05/17/2016   omeprazole 40 MG capsule Commonly known as:  PRILOSEC Take 40 mg by mouth daily.   ONE TOUCH ULTRA TEST test strip Generic drug:  glucose blood USE ONCE DAILY. AS INSTRUCTED.   onetouch ultrasoft lancets USE 1 EACH ONCE DAILY. USE AS INSTRUCTED. ONE TOUCH LANCETS   PREMARIN vaginal cream Generic drug:  conjugated estrogens Reported on 05/17/2016   sertraline 50 MG tablet Commonly known as:  ZOLOFT Take 50 mg by  mouth.   simvastatin 40 MG tablet Commonly known as:  ZOCOR Take 40 mg by mouth daily.   solifenacin 10 MG tablet Commonly known as:  VESICARE Take by mouth. Reported on 05/17/2016   tiZANidine 4 MG tablet Commonly known as:  ZANAFLEX Take 1 tablet (4 mg total) by mouth every 8 (eight) hours as needed for muscle spasms.   traZODone 50 MG tablet Commonly known as:  DESYREL Take 100 mg by mouth at bedtime.   triamterene-hydrochlorothiazide 37.5-25 MG tablet Commonly known as:  MAXZIDE-25 Take 1 tablet by mouth daily. Reported on 04/23/2016   VITAMIN D-1000 MAX ST 1000 units tablet Generic drug:  Cholecalciferol Take by mouth.       Allergies:  Allergies  Allergen Reactions  . Mirabegron Other (See Comments)  Severe tremor, headache  . Metformin And Related Diarrhea    Family History: Family History  Problem Relation Age of Onset  . Heart disease Mother   . Heart disease Father   . Diabetes Father   . Hypertension Father   . Cancer Brother   . Kidney disease Daughter   . Bladder Cancer Neg Hx   . Kidney cancer Neg Hx     Social History:  reports that she has quit smoking. She has never used smokeless tobacco. She reports that she does not drink alcohol or use drugs.  ROS: UROLOGY Frequent Urination?: Yes Hard to postpone urination?: Yes Burning/pain with urination?: No Get up at night to urinate?: Yes Leakage of urine?: Yes Urine stream starts and stops?: Yes Trouble starting stream?: No Do you have to strain to urinate?: No Blood in urine?: No Urinary tract infection?: No Sexually transmitted disease?: No Injury to kidneys or bladder?: No Painful intercourse?: No Weak stream?: No Currently pregnant?: No Vaginal bleeding?: No Last menstrual period?: n  Gastrointestinal Nausea?: No Vomiting?: No Indigestion/heartburn?: No Diarrhea?: Yes Constipation?: No  Constitutional Fever: No Night sweats?: No Weight loss?: No Fatigue?:  No  Skin Skin rash/lesions?: No Itching?: No  Eyes Blurred vision?: No Double vision?: No  Ears/Nose/Throat Sore throat?: No Sinus problems?: No  Hematologic/Lymphatic Swollen glands?: No Easy bruising?: No  Cardiovascular Leg swelling?: No Chest pain?: No  Respiratory Cough?: No Shortness of breath?: No  Endocrine Excessive thirst?: No  Musculoskeletal Back pain?: No Joint pain?: No  Neurological Headaches?: No Dizziness?: No  Psychologic Depression?: Yes Anxiety?: No  Physical Exam: BP 130/64   Pulse 64   Ht 5\' 1"  (1.549 m)   Wt 128 lb 8 oz (58.3 kg)   BMI 24.28 kg/m   Constitutional: Well nourished. Alert and oriented, No acute distress. HEENT: [place] AT, moist mucus membranes. Trachea midline, no masses. Cardiovascular: No clubbing, cyanosis, or edema. Respiratory: Normal respiratory effort, no increased work of breathing. GI: Abdomen is soft, non tender, non distended, no abdominal masses. Liver and spleen not palpable.  No hernias appreciated.  Stool sample for occult testing is not indicated.   GU: No CVA tenderness.  No bladder fullness or masses.  Atrophic external genitalia, normal pubic hair distribution, no lesions.  Normal urethral meatus, no lesions, no prolapse, no discharge.   No urethral masses, tenderness and/or tenderness.  A small polyp is seen in the vagina at the 7:00 position to the urethral meatus.  No bladder fullness, tenderness or masses. Pale vagina mucosa, poor estrogen effect, no discharge, no lesions, poor pelvic support, Grade II cystocele noted.  Urinary leakage noted on Valsalva.  Rectocele noted.  No cervical motion tenderness.  Uterus is surgically absent.  No adnexal/parametria masses or tenderness noted.  Anus and perineum are without rashes or lesions.    Skin: No rashes, bruises or suspicious lesions. Lymph: No cervical or inguinal adenopathy. Neurologic: Grossly intact, no focal deficits, moving all 4  extremities. Psychiatric: Normal mood and affect.  Laboratory Data: Results for orders placed or performed in visit on 10/14/17  BLADDER SCAN AMB NON-IMAGING  Result Value Ref Range   Scan Result 0    Hemoglobin A1c was 6.3% in September 2018 Serum creatinine was 0.9 in September 2018  I have reviewed the labs.  Pertinent Imaging: Results for CHIKITA, DOGAN (MRN [id number]) as of 10/14/2017 09:32  Ref. Range 10/14/2017 09:27  Scan Result Unknown 0     Assessment & Plan:  1. Mixed urinary incontinence  - patient has symptoms of both stress and urge incontinence  - she did respond well to Myrbetriq, but she experienced severe side effects and had to stop the medication  - she did not find the Vesicare effective.   The Toviaz caused extreme dry mouth  - did not pursue PTNS  - BLADDER SCAN AMB NON-IMAGING  - Will refer to PT for pelvic floor strengthening and gynecology for pessary placement  2. Cystocele  - Referred to physical therapy for pelvic floor strengthening  - Referred to gynecology for evaluation for possible pessary placement  - RTC in 3 months for PVR and an OAB questionnaire  3. Vaginal polyp  - Patient will be referred for gynecology for further evaluation of polyp   4. Vaginal atrophy  - Will not prescribe vaginal estrogen cream at this time and tell vaginal polyp is evaluated by gynecology and they deem it safe to initiate vaginal cream   Return in about 3 months (around 01/14/2018) for PVR and OAB questionnaire.  These notes generated with voice recognition software. I apologize for typographical errors.  Zara Council, [facility] [address] [place], [place] [address] [phone]

## 2017-10-14 ENCOUNTER — Encounter: Payer: Self-pay | Admitting: Urology

## 2017-10-14 ENCOUNTER — Ambulatory Visit (INDEPENDENT_AMBULATORY_CARE_PROVIDER_SITE_OTHER): Payer: PPO | Admitting: Urology

## 2017-10-14 VITALS — BP 130/64 | HR 64 | Ht 61.0 in | Wt 128.5 lb

## 2017-10-14 DIAGNOSIS — N842 Polyp of vagina: Secondary | ICD-10-CM | POA: Diagnosis not present

## 2017-10-14 DIAGNOSIS — N952 Postmenopausal atrophic vaginitis: Secondary | ICD-10-CM | POA: Diagnosis not present

## 2017-10-14 DIAGNOSIS — N3946 Mixed incontinence: Secondary | ICD-10-CM

## 2017-10-14 DIAGNOSIS — N8111 Cystocele, midline: Secondary | ICD-10-CM

## 2017-10-14 LAB — BLADDER SCAN AMB NON-IMAGING: SCAN RESULT: 0

## 2017-11-09 DIAGNOSIS — R197 Diarrhea, unspecified: Secondary | ICD-10-CM | POA: Diagnosis not present

## 2017-11-11 ENCOUNTER — Encounter: Payer: Self-pay | Admitting: *Deleted

## 2017-11-14 ENCOUNTER — Ambulatory Visit
Admission: RE | Admit: 2017-11-14 | Discharge: 2017-11-14 | Disposition: A | Discharge status: Home / Self Care | Payer: PPO | Source: Ambulatory Visit | Attending: Gastroenterology | Admitting: Gastroenterology

## 2017-11-14 ENCOUNTER — Encounter: Payer: Self-pay | Admitting: *Deleted

## 2017-11-14 ENCOUNTER — Encounter
Admission: RE | Disposition: A | Discharge status: Home / Self Care | Payer: Self-pay | Source: Ambulatory Visit | Attending: Gastroenterology

## 2017-11-14 ENCOUNTER — Ambulatory Visit: Payer: PPO | Admitting: Anesthesiology

## 2017-11-14 DIAGNOSIS — I10 Essential (primary) hypertension: Secondary | ICD-10-CM | POA: Insufficient documentation

## 2017-11-14 DIAGNOSIS — K5790 Diverticulosis of intestine, part unspecified, without perforation or abscess without bleeding: Secondary | ICD-10-CM | POA: Diagnosis not present

## 2017-11-14 DIAGNOSIS — Z7984 Long term (current) use of oral hypoglycemic drugs: Secondary | ICD-10-CM | POA: Insufficient documentation

## 2017-11-14 DIAGNOSIS — F329 Major depressive disorder, single episode, unspecified: Secondary | ICD-10-CM | POA: Insufficient documentation

## 2017-11-14 DIAGNOSIS — E7800 Pure hypercholesterolemia, unspecified: Secondary | ICD-10-CM | POA: Diagnosis not present

## 2017-11-14 DIAGNOSIS — K529 Noninfective gastroenteritis and colitis, unspecified: Secondary | ICD-10-CM | POA: Diagnosis not present

## 2017-11-14 DIAGNOSIS — D128 Benign neoplasm of rectum: Secondary | ICD-10-CM | POA: Diagnosis not present

## 2017-11-14 DIAGNOSIS — Z7982 Long term (current) use of aspirin: Secondary | ICD-10-CM | POA: Insufficient documentation

## 2017-11-14 DIAGNOSIS — K635 Polyp of colon: Secondary | ICD-10-CM | POA: Diagnosis not present

## 2017-11-14 DIAGNOSIS — R197 Diarrhea, unspecified: Secondary | ICD-10-CM | POA: Diagnosis not present

## 2017-11-14 DIAGNOSIS — D122 Benign neoplasm of ascending colon: Secondary | ICD-10-CM | POA: Diagnosis not present

## 2017-11-14 DIAGNOSIS — K219 Gastro-esophageal reflux disease without esophagitis: Secondary | ICD-10-CM | POA: Insufficient documentation

## 2017-11-14 DIAGNOSIS — F419 Anxiety disorder, unspecified: Secondary | ICD-10-CM | POA: Diagnosis not present

## 2017-11-14 DIAGNOSIS — Z79899 Other long term (current) drug therapy: Secondary | ICD-10-CM | POA: Diagnosis not present

## 2017-11-14 DIAGNOSIS — M109 Gout, unspecified: Secondary | ICD-10-CM | POA: Insufficient documentation

## 2017-11-14 DIAGNOSIS — E119 Type 2 diabetes mellitus without complications: Secondary | ICD-10-CM | POA: Diagnosis not present

## 2017-11-14 DIAGNOSIS — K621 Rectal polyp: Secondary | ICD-10-CM | POA: Insufficient documentation

## 2017-11-14 HISTORY — PX: COLONOSCOPY WITH PROPOFOL: SHX5780

## 2017-11-14 HISTORY — DX: Major depressive disorder, single episode, unspecified: F32.9

## 2017-11-14 HISTORY — DX: Depression, unspecified: F32.A

## 2017-11-14 HISTORY — DX: Pneumonia, unspecified organism: J18.9

## 2017-11-14 LAB — GLUCOSE, CAPILLARY: Glucose-Capillary: 122 mg/dL — ABNORMAL HIGH (ref 65–99)

## 2017-11-14 SURGERY — COLONOSCOPY WITH PROPOFOL
Anesthesia: General

## 2017-11-14 MED ORDER — PROPOFOL 500 MG/50ML IV EMUL
INTRAVENOUS | Status: DC | PRN
  Administered 2017-11-14: 120 ug/kg/min via INTRAVENOUS

## 2017-11-14 MED ORDER — SODIUM CHLORIDE 0.9 % IV SOLN: INTRAVENOUS | Status: DC

## 2017-11-14 MED ORDER — LIDOCAINE HCL (CARDIAC) 20 MG/ML IV SOLN
INTRAVENOUS | Status: DC | PRN
  Administered 2017-11-14: 20 mg via INTRAVENOUS

## 2017-11-14 MED ORDER — LIDOCAINE HCL (PF) 2 % IJ SOLN
INTRAMUSCULAR | Status: AC
  Filled 2017-11-14: qty 10

## 2017-11-14 MED ORDER — PROPOFOL 10 MG/ML IV BOLUS
INTRAVENOUS | Status: DC | PRN
  Administered 2017-11-14: 40 mg via INTRAVENOUS
  Administered 2017-11-14: 20 mg via INTRAVENOUS

## 2017-11-14 MED ORDER — PROPOFOL 500 MG/50ML IV EMUL
INTRAVENOUS | Status: AC
  Filled 2017-11-14: qty 50

## 2017-11-14 MED ORDER — SODIUM CHLORIDE 0.9 % IV SOLN
INTRAVENOUS | Status: DC
  Administered 2017-11-14: 08:00:00 via INTRAVENOUS

## 2017-11-14 NOTE — Anesthesia Postprocedure Evaluation (Signed)
Anesthesia Post Note  Patient: Candice Cook  Procedure(s) Performed: COLONOSCOPY WITH PROPOFOL (N/A )  Patient location during evaluation: PACU Anesthesia Type: General Level of consciousness: awake Pain management: pain level controlled Vital Signs Assessment: post-procedure vital signs reviewed and stable Respiratory status: nonlabored ventilation Cardiovascular status: stable Anesthetic complications: no     Last Vitals:  Vitals:   11/14/17 0916 11/14/17 0936  BP: (!) 117/53 (!) 138/51  Pulse: (!) 50   Resp: 13   Temp: (!) 35.8 C   SpO2: 97%     Last Pain:  Vitals:   11/14/17 0916  TempSrc: Tympanic                 VAN STAVEREN,[name]

## 2017-11-14 NOTE — Transfer of Care (Signed)
Immediate Anesthesia Transfer of Care Note  Patient: Candice Cook  Procedure(s) Performed: COLONOSCOPY WITH PROPOFOL (N/A )  Patient Location: PACU and Endoscopy Unit  Anesthesia Type:General  Level of Consciousness: awake  Airway & Oxygen Therapy: Patient Spontanous Breathing  Post-op Assessment: Report given to RN and Post -op Vital signs reviewed and stable  Post vital signs: stable  Last Vitals:  Vitals:   11/14/17 0730 11/14/17 0916  BP: (!) 160/64 (!) 117/53  Pulse: (!) 58 (!) 50  Resp: 16   Temp: (!) 36 C (!) 35.8 C  SpO2: 97% 97%    Last Pain:  Vitals:   11/14/17 0916  TempSrc: Tympanic         Complications: No apparent anesthesia complications

## 2017-11-14 NOTE — Anesthesia Preprocedure Evaluation (Signed)
Anesthesia Evaluation  Patient identified by MRN, date of birth, ID band Patient awake    Reviewed: Allergy & Precautions, NPO status , Patient's Chart, lab work & pertinent test results  Airway Mallampati: II       Dental  (+) Teeth Intact   Pulmonary former smoker,    breath sounds clear to auscultation       Cardiovascular Exercise Tolerance: Good hypertension, Pt. on medications  Rhythm:Regular Rate:Normal     Neuro/Psych Anxiety Depression    GI/Hepatic Neg liver ROS, GERD  Medicated,  Endo/Other  diabetes, Type 2, Oral Hypoglycemic Agents  Renal/GU      Musculoskeletal   Abdominal Normal abdominal exam  (+)   Peds negative pediatric ROS (+)  Hematology negative hematology ROS (+)   Anesthesia Other Findings   Reproductive/Obstetrics                             Anesthesia Physical Anesthesia Plan  ASA: II  Anesthesia Plan: General   Post-op Pain Management:    Induction: Intravenous  PONV Risk Score and Plan: 0  Airway Management Planned: Natural Airway and Nasal Cannula  Additional Equipment:   Intra-op Plan:   Post-operative Plan:   Informed Consent: I have reviewed the patients History and Physical, chart, labs and discussed the procedure including the risks, benefits and alternatives for the proposed anesthesia with the patient or authorized representative who has indicated his/her understanding and acceptance.     Plan Discussed with: CRNA  Anesthesia Plan Comments:         Anesthesia Quick Evaluation

## 2017-11-14 NOTE — H&P (Signed)
Outpatient short stay form Pre-procedure 11/14/2017 8:25 AM Lollie Sails MD  Primary Physician: Dr. Genene Churn  Reason for visit:  Colonoscopy  History of present illness:  Patient is a 76 year old female presenting today as above. She has personal history of chronic diarrhea. She will start off the morning with a formed stool and over the course of today the stools will become more loose. She does have a small amount of fecal incontinence. She takes 81 mg aspirin daily. She takes no other aspirin product or blood thinning agents.    Current Facility-Administered Medications:  .  0.9 %  sodium chloride infusion, , Intravenous, Continuous, Lollie Sails, MD, Last Rate: 20 mL/hr at 11/14/17 0755 .  0.9 %  sodium chloride infusion, , Intravenous, Continuous, Lollie Sails, MD  Medications Prior to Admission  Medication Sig Dispense Refill Last Dose  . allopurinol (ZYLOPRIM) 100 MG tablet Take 200 mg daily by mouth.   11/13/2017 at Unknown time  . calcium-vitamin D (OSCAL WITH D) 500-200 MG-UNIT per tablet Take 1 tablet by mouth. Reported on 05/17/2016   Past Week at Unknown time  . Cholecalciferol (VITAMIN D-1000 MAX ST) 1000 units tablet Take 2,000 Units daily by mouth.    Past Week at Unknown time  . Cyanocobalamin 2500 MCG SUBL Place under the tongue.   Past Week at Unknown time  . donepezil (ARICEPT) 10 MG tablet Take 10 mg by mouth at bedtime.   Past Week at Unknown time  . gabapentin (NEURONTIN) 300 MG capsule Take by mouth.   Past Week at Unknown time  . glimepiride (AMARYL) 2 MG tablet Take 2 mg by mouth daily with breakfast.   Past Week at Unknown time  . omeprazole (PRILOSEC) 40 MG capsule Take 40 mg by mouth daily.   Past Week at Unknown time  . sertraline (ZOLOFT) 50 MG tablet Take 50 mg by mouth.   Past Week at Unknown time  . simvastatin (ZOCOR) 20 MG tablet Take 20 mg daily by mouth.   Past Week at Unknown time  . traZODone (DESYREL) 50 MG tablet Take 100 mg by  mouth at bedtime.    Past Week at Unknown time  . alendronate (FOSAMAX) 70 MG tablet Take 70 mg by mouth. Reported on 05/17/2016   Not Taking  . amoxicillin-clavulanate (AUGMENTIN) 875-125 MG tablet Take 1 tablet by mouth 2 (two) times daily. (Patient not taking: Reported on 03/18/2017) 14 tablet 0 Not Taking  . aspirin EC 81 MG tablet Take 81 mg by mouth daily.   11/12/2017  . azithromycin (ZITHROMAX Z-PAK) 250 MG tablet Take 2 tablets first day and then 1 po a day for 4 days (Patient not taking: Reported on 03/18/2017) 6 tablet 0 Not Taking  . Blood Glucose Calibration (LIBERTY GLUCOSE CONTROL) Normal LIQD Use as directed.   Taking  . conjugated estrogens (PREMARIN) vaginal cream Reported on 05/17/2016   Not Taking  . fexofenadine-pseudoephedrine (ALLEGRA-D ALLERGY & CONGESTION) 180-240 MG 24 hr tablet Take 1 tablet by mouth daily. 30 tablet 0 05/16/2017 at Unknown time  . indomethacin (INDOCIN) 25 MG capsule Take 25 mg by mouth. Reported on 05/17/2016   Not Taking  . Lancets (ONETOUCH ULTRASOFT) lancets USE 1 EACH ONCE DAILY. USE AS INSTRUCTED. ONE TOUCH LANCETS  3 Taking  . levothyroxine (SYNTHROID, LEVOTHROID) 75 MCG tablet Take 50 mcg by mouth. Reported on 04/23/2016   Not Taking  . lisinopril-hydrochlorothiazide (PRINZIDE,ZESTORETIC) 20-12.5 MG tablet Take 1 tablet daily by mouth.  10/05/2016 at Unknown time  . meloxicam (MOBIC) 7.5 MG tablet Take 1 tablet (7.5 mg total) by mouth daily. (Patient not taking: Reported on 03/18/2017) 30 tablet 0 Not Taking  . MYRBETRIQ 25 MG TB24 tablet Reported on 05/17/2016  2 Not Taking  . ONE TOUCH ULTRA TEST test strip USE ONCE DAILY. AS INSTRUCTED.  3 Taking  . solifenacin (VESICARE) 10 MG tablet Take by mouth. Reported on 05/17/2016   Not Taking  . tiZANidine (ZANAFLEX) 4 MG tablet Take 1 tablet (4 mg total) by mouth every 8 (eight) hours as needed for muscle spasms. (Patient not taking: Reported on 03/18/2017) 60 tablet 0 Not Taking     Allergies  Allergen  Reactions  . Mirabegron Other (See Comments)    Severe tremor, headache  . Metformin And Related Diarrhea     Past Medical History:  Diagnosis Date  . Anxiety and depression   . Arthritis   . Cancer ([place])    skin ca  . Depression   . Diabetes mellitus without complication ([place])   . GERD (gastroesophageal reflux disease)   . Gout   . Gout   . Heart murmur   . Hypercholesteremia   . Hypertension   . Mixed incontinence   . Pneumonia    Right lower lobe pneumonia  . Thyroid condition     Review of systems:      Physical Exam    Heart and lungs: Regular rate and rhythm without rub or gallop, lungs are bilaterally clear    HEENT: Normocephalic atraumatic eyes are anicteric    Other:     Pertinant exam for procedure: Soft nontender nondistended bowel sounds positive normoactive    Planned proceedures: Colonoscopy and indicated procedures. I have discussed the risks benefits and complications of procedures to include not limited to bleeding, infection, perforation and the risk of sedation and the patient wishes to proceed.    Lollie Sails, MD Gastroenterology 11/14/2017  8:25 AM

## 2017-11-14 NOTE — Op Note (Signed)
[facility]enter[facility] Patient Name: Candice Cook Procedure Date: 11/14/2017 8:27 AM MRN: [id number] Account #: [id number] Date of Birth: 1941/07/04 Admit Type: Outpatient Age: 76 Room: [facility] ENDO ROOM 1 Gender: Female Note Status: Finalized Procedure:            Colonoscopy Indications:          Chronic diarrhea Providers:            Lollie Sails, MD Referring MD:         Christena Flake. Raechel Ache, MD (Referring MD) Medicines:            Monitored Anesthesia Care Complications:        No immediate complications. Procedure:            Pre-Anesthesia Assessment:                       - ASA Grade Assessment: III - A patient with severe                        systemic disease.                       After obtaining informed consent, the colonoscope was                        passed under direct vision. Throughout the procedure,                        the patient's blood pressure, pulse, and oxygen                        saturations were monitored continuously. The                        Colonoscope was introduced through the anus and                        advanced to the the cecum, identified by appendiceal                        orifice and ileocecal valve. The colonoscopy was                        performed with moderate difficulty due to significant                        looping. Successful completion of the procedure was                        aided by using manual pressure. The patient tolerated                        the procedure well. The quality of the bowel                        preparation was good. Findings:      A 3 mm polyp was found in the rectum. The polyp was sessile. The polyp       was removed with a cold biopsy forceps. Resection and retrieval were       complete.      Two sessile  polyps were found in the proximal ascending colon. The       polyps were 3 to 4 mm in size. These polyps were removed with a cold       snare. Resection and  retrieval were complete.      A 4 mm polyp was found in the cecum. The polyp was sessile. The polyp       was removed with a cold biopsy forceps. Resection and retrieval were       complete.      Biopsies for histology were taken with a cold forceps from the right       colon and left colon for evaluation of microscopic colitis.      The retroflexed view of the distal rectum and anal verge was normal and       showed no anal or rectal abnormalities.      The digital rectal exam was normal. Impression:           - One 3 mm polyp in the rectum, removed with a cold                        biopsy forceps. Resected and retrieved.                       - Two 3 to 4 mm polyps in the proximal ascending colon,                        removed with a cold snare. Resected and retrieved.                       - One 4 mm polyp in the cecum, removed with a cold                        biopsy forceps. Resected and retrieved.                       - The distal rectum and anal verge are normal on                        retroflexion view.                       - Biopsies were taken with a cold forceps from the                        right colon and left colon for evaluation of                        microscopic colitis. Recommendation:       - Discharge patient to home.                       - Await pathology results.                       - Use Citrucel one tablespoon PO daily daily.                       - Imodium 1 tablet PO daily. Procedure Code(s):    --- Professional ---  45385, Colonoscopy, flexible; with removal of tumor(s),                        polyp(s), or other lesion(s) by snare technique                       45380, 59, Colonoscopy, flexible; with biopsy, single                        or multiple Diagnosis Code(s):    --- Professional ---                       K62.1, Rectal polyp                       D12.0, Benign neoplasm of cecum                       D12.2, Benign  neoplasm of ascending colon                       K52.9, Noninfective gastroenteritis and colitis,                        unspecified CPT copyright 2016 American Medical Association. All rights reserved. The codes documented in this report are preliminary and upon coder review may  be revised to meet current compliance requirements. Lollie Sails, MD 11/14/2017 9:16:16 AM This report has been signed electronically. Number of Addenda: 0 Note Initiated On: 11/14/2017 8:27 AM Scope Withdrawal Time: 0 hours 13 minutes 58 seconds  Total Procedure Duration: 0 hours 35 minutes 33 seconds       [facility]

## 2017-11-14 NOTE — Anesthesia Post-op Follow-up Note (Signed)
Anesthesia QCDR form completed.        

## 2017-11-15 ENCOUNTER — Encounter: Payer: Self-pay | Admitting: Gastroenterology

## 2017-11-15 LAB — SURGICAL PATHOLOGY

## 2017-12-12 ENCOUNTER — Encounter: Payer: Self-pay | Admitting: Obstetrics and Gynecology

## 2017-12-12 ENCOUNTER — Ambulatory Visit (INDEPENDENT_AMBULATORY_CARE_PROVIDER_SITE_OTHER): Payer: PPO | Admitting: Obstetrics and Gynecology

## 2017-12-12 VITALS — BP 136/63 | HR 60 | Ht 61.0 in | Wt 130.3 lb

## 2017-12-12 DIAGNOSIS — N3946 Mixed incontinence: Secondary | ICD-10-CM | POA: Diagnosis not present

## 2017-12-12 DIAGNOSIS — N952 Postmenopausal atrophic vaginitis: Secondary | ICD-10-CM | POA: Diagnosis not present

## 2017-12-12 DIAGNOSIS — N842 Polyp of vagina: Secondary | ICD-10-CM

## 2017-12-12 DIAGNOSIS — N8111 Cystocele, midline: Secondary | ICD-10-CM

## 2017-12-12 NOTE — Patient Instructions (Signed)
1.  Incontinence dish pessary is fitted today 2.  Return on 12/28/2017 for pessary insertion 3.  Begin using Premarin cream intravaginal 1/2 g twice weekly

## 2017-12-12 NOTE — Progress Notes (Signed)
GYN ENCOUNTER NOTE  Subjective:   [facility]  Diagnoses: 1.  Mixed incontinence 2.  Second-degree cystocele     Candice Cook is a 76 y.o. [phone] female is here for gynecologic evaluation of the following issues:  1.  Pessary trial.     PARA 3003 SVD x3; largest baby 9 pounds Status post hysterectomy Diagnosed with mixed incontinence by [facility] urology; no significant postvoid residual; recently treated with Myrbetriq but with side effects Leaks urine with coughing sneezing laughing lifting; has to wear pads No problem with chronic constipation; history of rectocele, moderate, asymptomatic.  Does not splint  Gynecologic History No LMP recorded. Patient has had a hysterectomy.  Obstetric History OB History  Gravida Para Term Preterm AB Living  3 3 3     3   SAB TAB Ectopic Multiple Live Births          3    # Outcome Date GA Lbr Len/2nd Weight Sex Delivery Anes PTL Lv  3 Term 1970   7 lb (3.175 kg) F Vag-Spont   LIV  2 Term 1968   7 lb 9.6 oz (3.447 kg) F Vag-Spont   LIV  1 Term 1963   9 lb (4.082 kg) M Vag-Spont   LIV      Past Medical History:  Diagnosis Date  . Anxiety and depression   . Arthritis   . Cancer ([place])    skin ca  . Depression   . Diabetes mellitus without complication ([place])   . GERD (gastroesophageal reflux disease)   . Gout   . Gout   . Heart murmur   . Hypercholesteremia   . Hypertension   . Mixed incontinence   . Pneumonia    Right lower lobe pneumonia  . Thyroid condition     Past Surgical History:  Procedure Laterality Date  . ABDOMINAL HYSTERECTOMY    . APPENDECTOMY  1993  . CATARACT EXTRACTION W/PHACO Left 03/22/2017   Procedure: CATARACT EXTRACTION PHACO AND INTRAOCULAR LENS PLACEMENT ([place]) left diabetic;  Surgeon: Eulogio Bear, MD;  Location: [facility];  Service: Ophthalmology;  Laterality: Left;  Diabetic  . CATARACT EXTRACTION W/PHACO Right 05/17/2017   Procedure: CATARACT EXTRACTION PHACO AND  INTRAOCULAR LENS PLACEMENT ([place]) Right diabetic;  Surgeon: Eulogio Bear, MD;  Location: [place];  Service: Ophthalmology;  Laterality: Right;  . CHOLECYSTECTOMY    . COLONOSCOPY WITH PROPOFOL N/A 11/14/2017   Procedure: COLONOSCOPY WITH PROPOFOL;  Surgeon: Lollie Sails, MD;  Location[facility] ENDOSCOPY;  Service: Endoscopy;  Laterality: N/A;  . EYE SURGERY     cataracts with extraocular prosthesis  . KIDNEY SURGERY     gave daughter a kidney  . 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 allopurinol (ZYLOPRIM) 100 MG tablet Take 200 mg daily by mouth.    Marland Kitchen aspirin EC 81 MG tablet Take 81 mg by mouth daily.    . Blood Glucose Calibration (LIBERTY GLUCOSE CONTROL) Normal LIQD Use as directed.    . Cholecalciferol (VITAMIN D-1000 MAX ST) 1000 units tablet Take 2,000 Units daily by mouth.     . Cyanocobalamin 2500 MCG SUBL Place under the tongue.    . donepezil (ARICEPT) 10 MG tablet Take 10 mg by mouth at bedtime.    . gabapentin (NEURONTIN) 300 MG capsule Take by mouth.    Marland Kitchen glimepiride (AMARYL) 2 MG  tablet Take 2 mg by mouth daily with breakfast.    . indomethacin (INDOCIN) 25 MG capsule Take 25 mg by mouth. Reported on 05/17/2016    . Lancets (ONETOUCH ULTRASOFT) lancets USE 1 EACH ONCE DAILY. USE AS INSTRUCTED. ONE TOUCH LANCETS  3  . lisinopril-hydrochlorothiazide (PRINZIDE,ZESTORETIC) 20-12.5 MG tablet Take 1 tablet daily by mouth.     Marland Kitchen omeprazole (PRILOSEC) 40 MG capsule Take 40 mg by mouth daily.    . ONE TOUCH ULTRA TEST test strip USE ONCE DAILY. AS INSTRUCTED.  3  . sertraline (ZOLOFT) 50 MG tablet Take 50 mg by mouth.    . simvastatin (ZOCOR) 20 MG tablet Take 20 mg daily by mouth.    . traZODone (DESYREL) 50 MG tablet Take 100 mg by mouth at bedtime.      No current facility-administered medications on file prior to visit.     Allergies  Allergen Reactions   . Mirabegron Other (See Comments)    Severe tremor, headache  . Metformin And Related Diarrhea    Social History   Socioeconomic History  . Marital status: Married    Spouse name: Not on file  . Number of children: Not on file  . Years of education: Not on file  . Highest education level: Not on file  Social Needs  . Financial resource strain: Not on file  . Food insecurity - worry: Not on file  . Food insecurity - inability: Not on file  . Transportation needs - medical: Not on file  . Transportation needs - non-medical: Not on file  Occupational History  . Not on file  Tobacco Use  . Smoking status: Former Research scientist (life sciences)  . Smokeless tobacco: Never Used  . Tobacco comment: quit 25 years  Substance and Sexual Activity  . Alcohol use: No    Alcohol/week: 0.0 oz  . Drug use: No  . Sexual activity: Not Currently    Birth control/protection: Other-see comments  Other Topics Concern  . Not on file  Social History Narrative  . Not on file    Family History  Problem Relation Age of Onset  . Heart disease Mother   . Heart disease Father   . Diabetes Father   . Hypertension Father   . Cancer Brother   . Kidney disease Daughter   . Bladder Cancer Neg Hx   . Kidney cancer Neg Hx     The following portions of the patient's history were reviewed and updated as appropriate: allergies, current medications, past family history, past medical history, past social history, past surgical history and problem list.  Review of Systems Review of Systems -comprehensive review of systems is negative except for that noted in the HPI  Objective:   BP 136/63   Pulse 60   Ht 5\' 1"  (1.549 m)   Wt 130 lb 4.8 oz (59.1 kg)   BMI 24.62 kg/m  CONSTITUTIONAL: Well-developed, well-nourished female in no acute distress.  HENT:  Normocephalic, atraumatic.  NECK: Normal range of motion, supple, no masses.  Normal thyroid.  SKIN: Skin is warm and dry. No rash noted. Not diaphoretic. No erythema. No  pallor. [place]: Alert and oriented to person, place, and time. PSYCHIATRIC: Normal mood and affect. Normal behavior. Normal judgment and thought content. CARDIOVASCULAR:Not Examined RESPIRATORY: Not Examined BREASTS: Not Examined ABDOMEN: Soft, non distended; Non tender.  No Organomegaly.  No hernias PELVIC:  External Genitalia: Normal  BUS: Normal  Vagina: Moderate atrophy; vaginal polyp 1 cm on the left distal suburethral  location; second-degree cystocele with Valsalva; moderate rectocele  Cervix: Surgically absent  Uterus: Surgically absent  Adnexa: Normal; nonpalpable and nontender  RV: Normal external exam  Bladder: Nontender MUSCULOSKELETAL: Normal range of motion. No tenderness.  No cyanosis, clubbing, or edema.  PROCEDURE: Pessary fitting  Incontinence dish with notch pessary-#4 too large  Incontinence dish with notch pessary #3 successful fit (Vaginal spotting occurred with pessary insertion and removal secondary to moderate/severe atrophy)     Assessment:   1. Mixed incontinence  2. Cystocele, midline  3.  Vaginal atrophy, moderate  4.  Vaginal polyp, asymptomatic     Plan:   1.  Premarin cream intravaginal twice weekly 1/2 g 2.  Incontinence dish with notch pessary #3 is fitted 3.  Return in January 2019 for pessary insertion when pessary arrives  Brayton Mars, MD  Note: This dictation was prepared with Dragon dictation along with smaller phrase technology. Any transcriptional errors that result from this process are unintentional.

## 2017-12-22 DIAGNOSIS — R413 Other amnesia: Secondary | ICD-10-CM | POA: Diagnosis not present

## 2017-12-22 DIAGNOSIS — R4586 Emotional lability: Secondary | ICD-10-CM | POA: Diagnosis not present

## 2017-12-28 ENCOUNTER — Telehealth: Payer: Self-pay | Admitting: Obstetrics and Gynecology

## 2017-12-28 ENCOUNTER — Ambulatory Visit (INDEPENDENT_AMBULATORY_CARE_PROVIDER_SITE_OTHER): Payer: Medicare HMO | Admitting: Obstetrics and Gynecology

## 2017-12-28 ENCOUNTER — Encounter: Payer: Self-pay | Admitting: Obstetrics and Gynecology

## 2017-12-28 VITALS — BP 155/66 | HR 55 | Ht 61.0 in | Wt 127.2 lb

## 2017-12-28 DIAGNOSIS — L02415 Cutaneous abscess of right lower limb: Secondary | ICD-10-CM

## 2017-12-28 DIAGNOSIS — N3946 Mixed incontinence: Secondary | ICD-10-CM

## 2017-12-28 DIAGNOSIS — N8111 Cystocele, midline: Secondary | ICD-10-CM

## 2017-12-28 DIAGNOSIS — L03115 Cellulitis of right lower limb: Secondary | ICD-10-CM | POA: Insufficient documentation

## 2017-12-28 MED ORDER — ESTRADIOL 0.1 MG/GM VA CREA: 0.2500 | TOPICAL_CREAM | VAGINAL | 12 refills | Status: DC

## 2017-12-28 MED ORDER — SULFAMETHOXAZOLE-TRIMETHOPRIM 800-160 MG PO TABS: 1.0000 | ORAL_TABLET | Freq: Two times a day (BID) | ORAL | 0 refills | Status: DC

## 2017-12-28 MED ORDER — ESTROGENS, CONJUGATED 0.625 MG/GM VA CREA: 0.2500 | TOPICAL_CREAM | VAGINAL | 12 refills | Status: DC

## 2017-12-28 NOTE — Addendum Note (Signed)
Addended by: Elouise Munroe on: 12/28/2017 05:01 PM   Modules accepted: Orders

## 2017-12-28 NOTE — Progress Notes (Signed)
Chief complaint: 1.  Cystocele 2.  Mixed incontinence 3.  Right thigh abscess  Patient presents for pessary insertion (new) for management of midline cystocele and mixed incontinence.  The actual pessary fitted was the #2 incontinence dish with notch.  Past medical history: Past surgical history, problem list, medications, and allergies are reviewed  OBJECTIVE: Well-appearing female in no acute distress.  Alert and oriented. Abdomen: Soft, nontender Pelvic exam: External Genitalia: Normal             BUS: Normal             Vagina: Moderate atrophy; vaginal polyp 1 cm on the left distal suburethral location; second-degree cystocele with Valsalva; moderate rectocele             Cervix: Surgically absent             Uterus: Surgically absent             Adnexa: Normal; nonpalpable and nontender             RV: Normal external exam             Bladder: Nontender MUSCULOSKELETAL: Normal  EXTREMITIES: 1 cm bullous lesion with surrounding erythema approximately 2 cm diameter is noted ASSESSMENT:  PROCEDURE: #2 incontinence dish with notch pessary is inserted  ASSESSMENT: 1.  Mixed incontinence 2.  Second-degree cystocele, symptomatic 3.  Successful pessary insertion-#2 incontinence dish with notch pessary 4.  Right thigh cellulitis  PLAN: 1.  Septra DS twice a day for 14 days 2.  Incontinence dish with notch pessary is inserted 3.  Trimosan gel intravaginal once a week 4.  Continue with Premarin cream intravaginal weekly 5.  Return in 2 weeks for pessary maintenance  A total of 15 minutes were spent face-to-face with the patient during this encounter and over half of that time dealt with counseling and coordination of care.  Brayton Mars, MD  Note: This dictation was prepared with Dragon dictation along with smaller phrase technology. Any transcriptional errors that result from this process are unintentional.

## 2017-12-28 NOTE — Patient Instructions (Signed)
1.  #2 incontinence dish with notch pessary is inserted 2.  Return in 2 weeks for pessary maintenance 3.  Instructions on using Trimosan gel are given 4.  Begin Septra DS twice a day for 14 days for cellulitis of right thigh

## 2018-01-04 ENCOUNTER — Ambulatory Visit: Payer: PPO | Admitting: Physical Therapy

## 2018-01-11 ENCOUNTER — Encounter: Payer: Self-pay | Admitting: Obstetrics and Gynecology

## 2018-01-11 ENCOUNTER — Ambulatory Visit (INDEPENDENT_AMBULATORY_CARE_PROVIDER_SITE_OTHER): Payer: Medicare HMO | Admitting: Obstetrics and Gynecology

## 2018-01-11 ENCOUNTER — Encounter: Payer: PPO | Admitting: Physical Therapy

## 2018-01-11 VITALS — BP 126/61 | HR 60 | Ht 61.0 in | Wt 130.2 lb

## 2018-01-11 DIAGNOSIS — N8111 Cystocele, midline: Secondary | ICD-10-CM

## 2018-01-11 DIAGNOSIS — N952 Postmenopausal atrophic vaginitis: Secondary | ICD-10-CM

## 2018-01-11 DIAGNOSIS — Z4689 Encounter for fitting and adjustment of other specified devices: Secondary | ICD-10-CM

## 2018-01-11 DIAGNOSIS — N3946 Mixed incontinence: Secondary | ICD-10-CM | POA: Diagnosis not present

## 2018-01-11 NOTE — Patient Instructions (Signed)
1.  The pessary is removed, cleaned, and reinserted today. 2.  Continue with Trimosan gel intravaginal once a week 3.  Continue with Premarin cream intravaginal once a week 4.  Return in 4 weeks for pessary maintenance

## 2018-01-11 NOTE — Progress Notes (Signed)
Chief complaint: 1.  Pessary maintenance 2.  Cystocele, midline 3.  Mixed incontinence 4.  Vaginal atrophy  #2.  Incontinence dish pessary Trimosan gel intravaginal weekly Premarin cream intravaginal twice weekly (current) No vaginal bleeding, vaginal discharge, or vaginal odor.  Patient presents for first pessary maintenance appointment 2 weeks after initial insertion of incontinence dish with notch pessary. Patient states that her life has been TRANSFORMED. She is very pleased with the pessary;She has a good sense of whenever she needs to go to the bathroom; she voids more completely when she urinates. Candice Cook is not interested in doing self maintenance of her pessary.  Past medical history, past surgical history, problem list, medications, and allergies are reviewed  OBJECTIVE: BP 126/61   Pulse 60   Ht 5\' 1"  (1.549 m)   Wt 130 lb 3.2 oz (59.1 kg)   BMI 24.60 kg/m  Pleasant elderly female in no acute distress.  Alert and oriented. Abdomen: Soft, nontender Pelvic exam: External genitalia-normal BUS-normal Vagina-moderate atrophy; second-degree cystocele with Valsalva; moderate rectocele Cervix-surgically absent  Uterus-surgically absent Adnexa-nonpalpable nontender Rectovaginal-normal external exam Extremities: Right thigh bullous lesion, resolved  PROCEDURE: The incontinence dish pessary is removed, cleaned, and reinserted  ASSESSMENT: 1.  Mixed incontinence 2.  Second-degree cystocele, asymptomatic with pessary in place 3.  Right thigh cellulitis, resolved  PLAN: 1.  Pessary maintenance 2.  Continue Trimosan gel intravaginal weekly 3.  Continue with Premarin cream intravaginal weekly (not twice weekly) 4.  Return in 4 weeks for pessary maintenance  A total of 15 minutes were spent face-to-face with the patient during this encounter and over half of that time dealt with counseling and coordination of care.  Brayton Mars, MD  Note: This dictation was  prepared with Dragon dictation along with smaller phrase technology. Any transcriptional errors that result from this process are unintentional.

## 2018-01-12 NOTE — Progress Notes (Signed)
9:13 AM   Candice Cook 25-Oct-1941 [id number]  Referring provider: Ezequiel Kayser, MD [facility] [place], [place] [address]  Chief Complaint  Patient presents with  . Urinary Incontinence    67month    HPI: Patient is a 77 year old Caucasian female with mixed incontinence, vaginal atrophy, cystocele and a vaginal polyp who presents today for a three month follow up.    Mixed incontinence She failed Myrbetriq and Toviaz.  She was interested in PTNS, but she did not return to start the treatments.   She has been seen by gynecology and fitted for a pessary.  She stated that the pessary has been life changing.  Today, she is experiencing symptoms of frequency x 0-3 (stable), urgency is mild (stable), nocturia x 1 (stable), incontinence x 0-3 (improved).  She is also having diarrhea.  Her PVR 0 mL. She denies dysuria, suprapubic pain and gross hematuria. She denies any fevers, chills, nausea or vomiting.  She is now down to one panty liner daily vs the thick pads since the pessary was placed.    Vaginal atrophy She is using an applicator full of Premarin cream weekly.    Cystocele Has a pessary in place.     PMH: Past Medical History:  Diagnosis Date  . Anxiety and depression   . Arthritis   . Cancer ([place])    skin ca  . Depression   . Diabetes mellitus without complication ([place])   . GERD (gastroesophageal reflux disease)   . Gout   . Gout   . Heart murmur   . Hypercholesteremia   . Hypertension   . Mixed incontinence   . Pneumonia    Right lower lobe pneumonia  . Thyroid condition     Surgical History: Past Surgical History:  Procedure Laterality Date  . ABDOMINAL HYSTERECTOMY    . APPENDECTOMY  1993  . CATARACT EXTRACTION W/PHACO Left 03/22/2017   Procedure: CATARACT EXTRACTION PHACO AND INTRAOCULAR LENS PLACEMENT ([place]) left diabetic;  Surgeon: Eulogio Bear, MD;  Location: [facility];  Service: Ophthalmology;  Laterality:  Left;  Diabetic  . CATARACT EXTRACTION W/PHACO Right 05/17/2017   Procedure: CATARACT EXTRACTION PHACO AND INTRAOCULAR LENS PLACEMENT ([place]) Right diabetic;  Surgeon: Eulogio Bear, MD;  Location: [place];  Service: Ophthalmology;  Laterality: Right;  . CHOLECYSTECTOMY    . COLONOSCOPY WITH PROPOFOL N/A 11/14/2017   Procedure: COLONOSCOPY WITH PROPOFOL;  Surgeon: Lollie Sails, MD;  Location: [facility] ENDOSCOPY;  Service: Endoscopy;  Laterality: N/A;  . EYE SURGERY     cataracts with extraocular prosthesis  . KIDNEY SURGERY     gave daughter a kidney  . 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 Always use your most recent med list.          allopurinol 100 MG tablet Commonly known as:  ZYLOPRIM Take 200 mg daily by mouth.   aspirin EC 81 MG tablet Take 81 mg by mouth daily.   conjugated estrogens vaginal cream Commonly known as:  PREMARIN Place 2.45 Applicatorfuls vaginally 2 (two) times a week.   Cyanocobalamin 2500 MCG Subl Place under the tongue.  donepezil 10 MG tablet Commonly known as:  ARICEPT Take 10 mg by mouth at bedtime.   estradiol 0.1 MG/GM vaginal cream Commonly known as:  ESTRACE VAGINAL Place 7.32 Applicatorfuls vaginally 2 (two) times a week.   gabapentin 300 MG capsule Commonly known as:  NEURONTIN Take by mouth.   glimepiride 2 MG tablet Commonly known as:  AMARYL Take 2 mg by mouth daily with breakfast.   indomethacin 25 MG capsule Commonly known as:  INDOCIN Take 25 mg by mouth. Reported on 05/17/2016   LIBERTY GLUCOSE CONTROL Normal Liqd Use as directed.   lisinopril 2.5 MG tablet Commonly known as:  PRINIVIL,ZESTRIL Take 2.5 mg by mouth daily.   omeprazole 40 MG capsule Commonly known as:   PRILOSEC Take 40 mg by mouth daily.   ONE TOUCH ULTRA TEST test strip Generic drug:  glucose blood USE ONCE DAILY. AS INSTRUCTED.   onetouch ultrasoft lancets USE 1 EACH ONCE DAILY. USE AS INSTRUCTED. ONE TOUCH LANCETS   sertraline 50 MG tablet Commonly known as:  ZOLOFT Take 50 mg by mouth.   simvastatin 20 MG tablet Commonly known as:  ZOCOR Take 20 mg daily by mouth.   traZODone 50 MG tablet Commonly known as:  DESYREL Take 100 mg by mouth at bedtime.   TRIMO-SAN VA Place vaginally.   VITAMIN D-1000 MAX ST 1000 units tablet Generic drug:  Cholecalciferol Take 2,000 Units daily by mouth.       Allergies:  Allergies  Allergen Reactions  . Mirabegron Other (See Comments)    Severe tremor, headache  . Metformin And Related Diarrhea    Family History: Family History  Problem Relation Age of Onset  . Heart disease Mother   . Heart disease Father   . Diabetes Father   . Hypertension Father   . Cancer Brother   . Kidney disease Daughter   . Bladder Cancer Neg Hx   . Kidney cancer Neg Hx     Social History:  reports that she has quit smoking. she has never used smokeless tobacco. She reports that she does not drink alcohol or use drugs.  ROS: UROLOGY Frequent Urination?: No Hard to postpone urination?: No Burning/pain with urination?: No Get up at night to urinate?: No Leakage of urine?: No Urine stream starts and stops?: No Trouble starting stream?: No Do you have to strain to urinate?: No Blood in urine?: No Urinary tract infection?: No Sexually transmitted disease?: No Injury to kidneys or bladder?: No Painful intercourse?: No Weak stream?: No Currently pregnant?: No Vaginal bleeding?: No Last menstrual period?: n  Gastrointestinal Nausea?: No Vomiting?: No Indigestion/heartburn?: No Diarrhea?: No Constipation?: No  Constitutional Fever: No Night sweats?: No Weight loss?: No Fatigue?: No  Skin Skin rash/lesions?: No Itching?:  No  Eyes Blurred vision?: No Double vision?: No  Ears/Nose/Throat Sore throat?: No Sinus problems?: No  Hematologic/Lymphatic Swollen glands?: No Easy bruising?: No  Cardiovascular Leg swelling?: No Chest pain?: No  Respiratory Cough?: No Shortness of breath?: No  Endocrine Excessive thirst?: No  Musculoskeletal Back pain?: No Joint pain?: No  Neurological Headaches?: No Dizziness?: No  Psychologic Depression?: No Anxiety?: No  Physical Exam: BP 135/62   Pulse 61   Ht 5\' 1"  (1.549 m)   Wt 130 lb (59 kg)   BMI 24.56 kg/m   Constitutional: Well nourished. Alert and oriented, No acute distress. HEENT: [place] AT, moist mucus membranes. Trachea midline, no masses. Cardiovascular: No clubbing, cyanosis, or edema. Respiratory: Normal respiratory effort, no increased  work of breathing. Skin: No rashes, bruises or suspicious lesions. Lymph: No cervical or inguinal adenopathy. Neurologic: Grossly intact, no focal deficits, moving all 4 extremities. Psychiatric: Normal mood and affect.  Laboratory Data: Results for orders placed or performed in visit on 01/13/18  BLADDER SCAN AMB NON-IMAGING  Result Value Ref Range   Scan Result 70ml    Hemoglobin A1c was 6.3% in September 2018 Serum creatinine was 0.9 in September 2018  I have reviewed the labs.  Pertinent Imaging: Results for GEOVANA, GEBEL (MRN [id number]) as of 01/17/2018 07:46  Ref. Range 01/13/2018 08:59  Scan Result Unknown 70ml    Assessment & Plan:    1. Mixed urinary incontinence  - she did respond well to Myrbetriq, but she experienced severe side effects and had to stop the medication  - she did not find the Vesicare effective.   The Toviaz caused extreme dry mouth  - did not pursue PTNS  - BLADDER SCAN AMB NON-IMAGING  - success with pessary  2. Cystocele  - pessary in place  3. Vaginal atrophy  - continue vaginal estrogen cream  Return in about 1 year (around 01/13/2019) for OAB  questionnaire, PVR and exam.  These notes generated with voice recognition software. I apologize for typographical errors.  Zara Council, [facility] [address], [place], [place] [address] [phone]

## 2018-01-13 ENCOUNTER — Ambulatory Visit (INDEPENDENT_AMBULATORY_CARE_PROVIDER_SITE_OTHER): Payer: Medicare HMO | Admitting: Urology

## 2018-01-13 ENCOUNTER — Encounter: Payer: Self-pay | Admitting: Urology

## 2018-01-13 VITALS — BP 135/62 | HR 61 | Ht 61.0 in | Wt 130.0 lb

## 2018-01-13 DIAGNOSIS — N8111 Cystocele, midline: Secondary | ICD-10-CM | POA: Diagnosis not present

## 2018-01-13 DIAGNOSIS — N952 Postmenopausal atrophic vaginitis: Secondary | ICD-10-CM | POA: Diagnosis not present

## 2018-01-13 DIAGNOSIS — R32 Unspecified urinary incontinence: Secondary | ICD-10-CM

## 2018-01-13 LAB — BLADDER SCAN AMB NON-IMAGING

## 2018-01-18 ENCOUNTER — Encounter: Payer: PPO | Admitting: Physical Therapy

## 2018-01-25 ENCOUNTER — Encounter: Payer: PPO | Admitting: Physical Therapy

## 2018-02-04 ENCOUNTER — Emergency Department: Payer: Medicare HMO

## 2018-02-04 ENCOUNTER — Other Ambulatory Visit: Payer: Self-pay

## 2018-02-04 ENCOUNTER — Emergency Department
Admission: EM | Admit: 2018-02-04 | Discharge: 2018-02-04 | Disposition: A | Discharge status: Home / Self Care | Payer: Medicare HMO | Attending: Emergency Medicine | Admitting: Emergency Medicine

## 2018-02-04 DIAGNOSIS — Y929 Unspecified place or not applicable: Secondary | ICD-10-CM | POA: Insufficient documentation

## 2018-02-04 DIAGNOSIS — Y999 Unspecified external cause status: Secondary | ICD-10-CM | POA: Insufficient documentation

## 2018-02-04 DIAGNOSIS — S82892A Other fracture of left lower leg, initial encounter for closed fracture: Secondary | ICD-10-CM | POA: Insufficient documentation

## 2018-02-04 DIAGNOSIS — Y9301 Activity, walking, marching and hiking: Secondary | ICD-10-CM | POA: Diagnosis not present

## 2018-02-04 DIAGNOSIS — Z85828 Personal history of other malignant neoplasm of skin: Secondary | ICD-10-CM | POA: Diagnosis not present

## 2018-02-04 DIAGNOSIS — S99912A Unspecified injury of left ankle, initial encounter: Secondary | ICD-10-CM | POA: Diagnosis present

## 2018-02-04 DIAGNOSIS — I10 Essential (primary) hypertension: Secondary | ICD-10-CM | POA: Diagnosis not present

## 2018-02-04 DIAGNOSIS — E119 Type 2 diabetes mellitus without complications: Secondary | ICD-10-CM | POA: Diagnosis not present

## 2018-02-04 DIAGNOSIS — W0110XA Fall on same level from slipping, tripping and stumbling with subsequent striking against unspecified object, initial encounter: Secondary | ICD-10-CM | POA: Diagnosis not present

## 2018-02-04 MED ORDER — NAPROXEN 375 MG PO TABS: 375.0000 mg | ORAL_TABLET | Freq: Two times a day (BID) | ORAL | 0 refills | Status: DC

## 2018-02-04 NOTE — ED Notes (Signed)
Ice pack applied to left ankle.

## 2018-02-04 NOTE — ED Notes (Signed)
Pt able to demonstrate use of walker without assistance. Pt and husband verbalized understanding of DC instructions.

## 2018-02-04 NOTE — ED Provider Notes (Signed)
[facility] Emergency Department Provider Note   ____________________________________________   None    (approximate)  I have reviewed the triage vital signs and the nursing notes.   HISTORY  Chief Complaint Ankle Injury    HPI Candice Cook is a 77 y.o. female patient arrived via EMS with right ankle swelling secondary to a fall.  Patient only pain with movement.  Patient left foot slipped out from under her and she fell and is unable to bear weight since the incident.  Ankle splint was applied by EMS.   Past Medical History:  Diagnosis Date  . Anxiety and depression   . Arthritis   . Cancer ([place])    skin ca  . Depression   . Diabetes mellitus without complication ([place])   . GERD (gastroesophageal reflux disease)   . Gout   . Gout   . Heart murmur   . Hypercholesteremia   . Hypertension   . Mixed incontinence   . Pneumonia    Right lower lobe pneumonia  . Thyroid condition     Patient Active Problem List   Diagnosis Date Noted  . Pessary maintenance 01/11/2018  . Cellulitis and abscess of right lower extremity 12/28/2017  . Vaginal polyp 12/12/2017  . Vaginal atrophy 12/12/2017  . Cystocele, midline 12/12/2017  . Mixed incontinence 03/23/2016    Past Surgical History:  Procedure Laterality Date  . ABDOMINAL HYSTERECTOMY    . APPENDECTOMY  1993  . CATARACT EXTRACTION W/PHACO Left 03/22/2017   Procedure: CATARACT EXTRACTION PHACO AND INTRAOCULAR LENS PLACEMENT ([place]) left diabetic;  Surgeon: Eulogio Bear, MD;  Location: [place];  Service: Ophthalmology;  Laterality: Left;  Diabetic  . CATARACT EXTRACTION W/PHACO Right 05/17/2017   Procedure: CATARACT EXTRACTION PHACO AND INTRAOCULAR LENS PLACEMENT ([place]) Right diabetic;  Surgeon: Eulogio Bear, MD;  Location: [place];  Service: Ophthalmology;  Laterality: Right;  . CHOLECYSTECTOMY    . COLONOSCOPY WITH PROPOFOL N/A 11/14/2017   Procedure: COLONOSCOPY  WITH PROPOFOL;  Surgeon: Lollie Sails, MD;  Location: [facility] ENDOSCOPY;  Service: Endoscopy;  Laterality: N/A;  . EYE SURGERY     cataracts with extraocular prosthesis  . KIDNEY SURGERY     gave daughter a kidney  . 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 Authorizing Provider  allopurinol (ZYLOPRIM) 100 MG tablet Take 200 mg daily by mouth.    [provider]  aspirin EC 81 MG tablet Take 81 mg by mouth daily.    [provider]  Blood Glucose Calibration (LIBERTY GLUCOSE CONTROL) Normal LIQD Use as directed. 02/20/14   [provider]  Cholecalciferol (VITAMIN D-1000 MAX ST) 1000 units tablet Take 2,000 Units daily by mouth.     [provider]  conjugated estrogens (PREMARIN) vaginal cream Place 1.01 Applicatorfuls vaginally 2 (two) times a week. 12/29/17   Defrancesco, Alanda Slim, MD  Cyanocobalamin 2500 MCG SUBL Place under the tongue.    [provider]  donepezil (ARICEPT) 10 MG tablet Take 10 mg by mouth at bedtime.    [provider]  estradiol (ESTRACE VAGINAL) 0.1 MG/GM vaginal cream Place 7.51 Applicatorfuls vaginally 2 (two) times a week. 12/29/17   Defrancesco, Alanda Slim, MD  gabapentin (NEURONTIN) 300 MG capsule Take by mouth. 03/15/16   [provider]  glimepiride (AMARYL) 2 MG tablet Take 2 mg by mouth daily with  breakfast.    [provider]  indomethacin (INDOCIN) 25 MG capsule Take 25 mg by mouth. Reported on 05/17/2016 03/08/12   [provider]  Lancets (ONETOUCH ULTRASOFT) lancets USE 1 EACH ONCE DAILY. USE AS INSTRUCTED. ONE TOUCH LANCETS 02/20/16   [provider]  lisinopril (PRINIVIL,ZESTRIL) 2.5 MG tablet Take 2.5 mg by mouth daily.    [provider]  naproxen (NAPROSYN) 375 MG tablet Take 1 tablet (375 mg total) by mouth 2 (two) times daily with a meal. 02/04/18 02/04/19  Sable Feil,  PA-C  omeprazole (PRILOSEC) 40 MG capsule Take 40 mg by mouth daily.    [provider]  ONE TOUCH ULTRA TEST test strip USE ONCE DAILY. AS INSTRUCTED. 02/20/16   [provider]  Oxyquinolone Sulfate [facility]) Place vaginally.    [provider]  sertraline (ZOLOFT) 50 MG tablet Take 50 mg by mouth. 02/12/13   [provider]  simvastatin (ZOCOR) 20 MG tablet Take 20 mg daily by mouth.    [provider]  traZODone (DESYREL) 50 MG tablet Take 100 mg by mouth at bedtime.     [provider]    Allergies Mirabegron and Metformin and related  Family History  Problem Relation Age of Onset  . Heart disease Mother   . Heart disease Father   . Diabetes Father   . Hypertension Father   . Cancer Brother   . Kidney disease Daughter   . Bladder Cancer Neg Hx   . Kidney cancer Neg Hx     Social History Social History   Tobacco Use  . Smoking status: Former Research scientist (life sciences)  . Smokeless tobacco: Never Used  . Tobacco comment: quit 25 years  Substance Use Topics  . Alcohol use: No    Alcohol/week: 0.0 oz  . Drug use: No    Review of Systems Constitutional: No fever/chills Eyes: No visual changes. ENT: No sore throat. Cardiovascular: Denies chest pain. Respiratory: Denies shortness of breath. Gastrointestinal: No abdominal pain.  No nausea, no vomiting.  No diarrhea.  No constipation. Genitourinary: Negative for dysuria. Musculoskeletal: Negative for back pain. Skin: Negative for rash. Neurological: Negative for headaches, focal weakness or numbness. Psychiatric:Anxiety depression. Endocrine:Diabetes, hyperlipidemia, hypothyroidism and hypertension. Allergic/Immunilogical: See medication list  ____________________________________________   PHYSICAL EXAM:  VITAL SIGNS: ED Triage Vitals  Enc Vitals Group     BP 02/04/18 1029 (!) 157/69     Pulse Rate 02/04/18 1029 (!) 57     Resp 02/04/18 1029 18     Temp 02/04/18 1029 98  F (36.7 C)     Temp Source 02/04/18 1029 Oral     SpO2 02/04/18 1023 98 %     Weight 02/04/18 1030 125 lb (56.7 kg)     Height 02/04/18 1030 5' (1.524 m)     Head Circumference --      Peak Flow --      Pain Score 02/04/18 1029 0     Pain Loc --      Pain Edu? --      Excl. in [place]? --    Constitutional: Alert and oriented. Well appearing and in no acute distress. Neck: No stridor.  No cervical spine tenderness to palpation. Hematological/Lymphatic/Immunilogical: No cervical lymphadenopathy. Cardiovascular: Normal rate, regular rhythm. Grossly normal heart sounds.  Good peripheral circulation. Respiratory: Normal respiratory effort.  No retractions. Lungs CTAB. Musculoskeletal: No lower extremity tenderness nor edema.  No joint effusions. Neurologic:  Normal speech and language. No gross  focal neurologic deficits are appreciated. No gait instability. Skin:  Skin is warm, dry and intact. No rash noted. Psychiatric: Mood and affect are normal. Speech and behavior are normal.  ____________________________________________   LABS (all labs ordered are listed, but only abnormal results are displayed)  Labs Reviewed - No data to display ____________________________________________  EKG   ____________________________________________  RADIOLOGY  ED MD interpretation: Nondisplaced distal fibula fracture Official radiology report(s): Dg Ankle Complete Left  Result Date: 02/04/2018 CLINICAL DATA:  Fall, lateral pain EXAM: LEFT ANKLE COMPLETE - 3+ VIEW COMPARISON:  None. FINDINGS: There is a nondisplaced fracture through the distal left fibula at the level of the ankle mortise. Overlying soft tissue swelling. No visible tibial abnormality. IMPRESSION: Nondisplaced fracture in the distal left fibula. Electronically Signed   By: Rolm Baptise M.D.   On: 02/04/2018 10:56    ____________________________________________   PROCEDURES  Procedure(s) performed: None  .Splint  Application Date/Time: 0/01/4096 11:04 AM Performed by: Suszanne Conners, NT Authorized by: Sable Feil, PA-C   Consent:    Consent obtained:  Verbal   Consent given by:  Patient   Risks discussed:  Pain and swelling Pre-procedure details:    Sensation:  Normal Procedure details:    Laterality:  Left   Location:  Ankle   Ankle:  L ankle   Splint type:  Short leg   Supplies:  Ortho-Glass    Critical Care performed: No  ____________________________________________   INITIAL IMPRESSION / ASSESSMENT AND PLAN / ED COURSE  As part of my medical decision making, I reviewed the following data within the electronic MEDICAL RECORD NUMBER    Ankle swelling secondary to distal fibula fracture.  Patient placed in a splint and given discharge care instructions.  Patient given crutches to assist with ambulation.  Patient advised to follow-up with orthopedic by calling for an appointment Monday morning.      ____________________________________________   FINAL CLINICAL IMPRESSION(S) / ED DIAGNOSES  Final diagnoses:  Closed fracture of left ankle, initial encounter     ED Discharge Orders        Ordered    naproxen (NAPROSYN) 375 MG tablet  2 times daily with meals     02/04/18 1117       Note:  This document was prepared using Dragon voice recognition software and may include unintentional dictation errors.    Sable Feil, PA-C 02/04/18 1117    Lisa Roca, MD 02/04/18 [phone]

## 2018-02-04 NOTE — ED Notes (Signed)
Pt states no pain currently but was unable to bear weight on foot after initial injury, left ankle is swollen, 2 + pedal pulse and good sensation in foot, good ROM in toes

## 2018-02-04 NOTE — ED Triage Notes (Signed)
Pt arrived via EMS from home after falling when her foot slipped out from other her, pain in left ankle, she unable to bear any weight on her foot, per EMS obvious deformity noted and ankle splint applied

## 2018-02-04 NOTE — Discharge Instructions (Signed)
Wear splint and ambulate with crutches until evaluation by orthopedics. °

## 2018-02-04 NOTE — ED Notes (Signed)
Xray completed in room.

## 2018-02-08 ENCOUNTER — Encounter: Payer: PPO | Admitting: Physical Therapy

## 2018-02-08 ENCOUNTER — Encounter: Payer: Medicare HMO | Admitting: Obstetrics and Gynecology

## 2018-02-15 ENCOUNTER — Encounter: Payer: Self-pay | Admitting: Obstetrics and Gynecology

## 2018-02-15 ENCOUNTER — Ambulatory Visit (INDEPENDENT_AMBULATORY_CARE_PROVIDER_SITE_OTHER): Payer: Medicare HMO | Admitting: Obstetrics and Gynecology

## 2018-02-15 VITALS — BP 138/67 | HR 59 | Ht 60.0 in

## 2018-02-15 DIAGNOSIS — N3946 Mixed incontinence: Secondary | ICD-10-CM

## 2018-02-15 DIAGNOSIS — N8111 Cystocele, midline: Secondary | ICD-10-CM | POA: Diagnosis not present

## 2018-02-15 DIAGNOSIS — Z4689 Encounter for fitting and adjustment of other specified devices: Secondary | ICD-10-CM | POA: Diagnosis not present

## 2018-02-15 DIAGNOSIS — N952 Postmenopausal atrophic vaginitis: Secondary | ICD-10-CM

## 2018-02-15 NOTE — Patient Instructions (Signed)
1.  Return in 6 weeks for pessary maintenance 2.  Continue using estrogen cream intravaginal weekly

## 2018-02-15 NOTE — Progress Notes (Signed)
Chief complaint: 1.  Pessary maintenance 2.  Cystocele, midline 3.  Mixed incontinence 4.  Vaginal atrophy  Patient is here for pessary maintenance.  Last visit was on 01/11/2018. #2  Incontinence dish pessary with notch Trimosan gel intravaginal weekly Premarin cream intravaginal weekly No vaginal bleeding, discharge, or odor  Patient continues to be very pleased with the impact the pessary has had on her activities of daily living.  She has good sensation when knowing appropriate time to void.  She voids more completely with each episode of urination.  Past medical history, past surgical history, problem list, medications, and allergies are reviewed  OBJECTIVE: BP 138/67   Pulse (!) 59   Ht 5' (1.524 m)   BMI 24.41 kg/m  Pleasant elderly female in no acute distress.  Alert and oriented. Abdomen: Soft, nontender Pelvic exam: External genitalia-normal BUS-normal Vagina-mild to moderate atrophy; second-degree cystocele with Valsalva; moderate rectocele Cervix-surgically absent Uterus-surgically absent Rectovaginal-normal external exam  PROCEDURE: The incontinence dish pessary with notch is removed, cleaned, and reinserted  ASSESSMENT: 1.  Mixed incontinence 2.  Second-degree cystocele, asymptomatic with pessary in place  PLAN: 1.  Pessary maintenance 2.  Continue Trimosan gel intravaginal weekly 3.  Continue Premarin cream intravaginal weekly 4.  Return in 6 weeks for pessary maintenance  A total of 15 minutes were spent face-to-face with the patient during this encounter and over half of that time dealt with counseling and coordination of care.  Brayton Mars, MD  Note: This dictation was prepared with Dragon dictation along with smaller phrase technology. Any transcriptional errors that result from this process are unintentional.

## 2018-02-22 ENCOUNTER — Encounter: Payer: PPO | Admitting: Physical Therapy

## 2018-03-08 ENCOUNTER — Encounter: Payer: PPO | Admitting: Physical Therapy

## 2018-03-16 NOTE — Telephone Encounter (Signed)
Error

## 2018-03-17 ENCOUNTER — Other Ambulatory Visit: Payer: Self-pay | Admitting: Internal Medicine

## 2018-03-17 DIAGNOSIS — Z1231 Encounter for screening mammogram for malignant neoplasm of breast: Secondary | ICD-10-CM

## 2018-03-29 ENCOUNTER — Encounter: Payer: Self-pay | Admitting: Obstetrics and Gynecology

## 2018-03-29 ENCOUNTER — Ambulatory Visit (INDEPENDENT_AMBULATORY_CARE_PROVIDER_SITE_OTHER): Payer: Medicare HMO | Admitting: Obstetrics and Gynecology

## 2018-03-29 VITALS — BP 145/56 | HR 62 | Ht 60.0 in | Wt 130.6 lb

## 2018-03-29 DIAGNOSIS — N8111 Cystocele, midline: Secondary | ICD-10-CM

## 2018-03-29 DIAGNOSIS — N952 Postmenopausal atrophic vaginitis: Secondary | ICD-10-CM

## 2018-03-29 DIAGNOSIS — N3946 Mixed incontinence: Secondary | ICD-10-CM | POA: Diagnosis not present

## 2018-03-29 DIAGNOSIS — Z4689 Encounter for fitting and adjustment of other specified devices: Secondary | ICD-10-CM

## 2018-03-29 NOTE — Progress Notes (Signed)
Chief complaint: 1.  Pessary maintenance 2.  Cystocele, midline 3.  Mixed incontinence 4.  Vaginal atrophy  Patient is here for pessary maintenance.  Last visit was on 02/15/2018 #2  Incontinence dish pessary with notch Trimosan gel intravaginal weekly Premarin cream intravaginal weekly No vaginal bleeding, discharge, or odor  Patient continues to be very pleased with the impact the pessary has had on her activities of daily living.  She has good sensation when knowing appropriate time to void.  She voids more completely with each episode of urination. Significant concern today includes the increased anxiety with having removal of the pessary-very uncomfortable.  Past medical history, past surgical history, problem list, medications, and allergies are reviewed  OBJECTIVE: BP (!) 145/56   Pulse 62   Ht 5' (1.524 m)   Wt 130 lb 9.6 oz (59.2 kg)   BMI 25.51 kg/m  Pleasant elderly female in no acute distress.  Alert and oriented. Abdomen: Soft, nontender Pelvic exam: External genitalia-normal BUS-normal Vagina-mild to moderate atrophy; second-degree cystocele with Valsalva; moderate rectocele Cervix-surgically absent Uterus-surgically absent Bimanual-no palpable masses or tenderness Rectovaginal-normal external exam  PROCEDURE: The incontinence dish pessary with notch is removed, cleaned, and reinserted  ASSESSMENT: 1.  Mixed incontinence 2.  Second-degree cystocele, asymptomatic with pessary in place 3.  Anxiety regarding discomfort with pessary removal  PLAN: 1.  Pessary maintenance 2.  Continue Trimosan gel intravaginal weekly 3.  Continue Premarin cream intravaginal weekly 4.  Patient is aware that we will use maximal lubricant to help with ease of removal at each visit 5.  Return in 6 weeks for pessary maintenance  A total of 15 minutes were spent face-to-face with the patient during this encounter and over half of that time dealt with counseling and  coordination of care.  Brayton Mars, MD  Note: This dictation was prepared with Dragon dictation along with smaller phrase technology. Any transcriptional errors that result from this process are unintentional.

## 2018-03-29 NOTE — Patient Instructions (Signed)
1.  Continue using Trimosan gel intravaginal once a week 2.  Continue using Premarin cream intravaginal once a week 3.  Return in 12 weeks (3 months) for pessary maintenance

## 2018-06-20 ENCOUNTER — Ambulatory Visit: Payer: Medicare HMO | Admitting: Obstetrics and Gynecology

## 2018-06-20 ENCOUNTER — Encounter: Payer: Self-pay | Admitting: Obstetrics and Gynecology

## 2018-06-20 VITALS — BP 149/65 | HR 66 | Ht 60.0 in | Wt 128.3 lb

## 2018-06-20 DIAGNOSIS — N8111 Cystocele, midline: Secondary | ICD-10-CM

## 2018-06-20 DIAGNOSIS — W57XXXA Bitten or stung by nonvenomous insect and other nonvenomous arthropods, initial encounter: Secondary | ICD-10-CM | POA: Insufficient documentation

## 2018-06-20 DIAGNOSIS — N952 Postmenopausal atrophic vaginitis: Secondary | ICD-10-CM | POA: Diagnosis not present

## 2018-06-20 DIAGNOSIS — Z4689 Encounter for fitting and adjustment of other specified devices: Secondary | ICD-10-CM | POA: Diagnosis not present

## 2018-06-20 DIAGNOSIS — N3946 Mixed incontinence: Secondary | ICD-10-CM | POA: Diagnosis not present

## 2018-06-20 MED ORDER — DOXYCYCLINE HYCLATE 100 MG PO CAPS: 200.0000 mg | ORAL_CAPSULE | Freq: Once | ORAL | 0 refills | Status: AC

## 2018-06-20 NOTE — Patient Instructions (Signed)
1.  Doxycycline 200 mg orally x1 dose-prophylaxis for Lyme disease based on criteria 2.  Continue Premarin cream intravaginal once a week 3.  Continue Trimosan gel intravaginal once a week 4.  Return in 12 weeks for pessary maintenance

## 2018-06-20 NOTE — Progress Notes (Signed)
Chief complaint: 1.  Pessary maintenance 2.  Midline cystocele 3.  Mixed incontinence 4.  Vaginal atrophy 5.  Tick bite  Patient presents for pessary maintenance. Last visit was 03/29/2018. #2  Incontinence dish pessary with notch Trimosan gel intravaginal weekly Premarin cream intravaginal weekly Patient reports no history of vaginal bleeding, discharge, or odor  Voiding is more complete with urination at this time.  Urgency symptoms are markedly reduced; she only notes nocturia x1.  She does have occasional leakage-minimal, and wears pads prophylactically. Bowel function is normal. Haydon is very pleased with the pessary impact on her quality of life.  Fujie pulled the tick off of her right flank this morning; she feels got the tick completely off of her.  It was associated with moderate bleeding.  She is unsure if the tick was attached for greater than 72 hours  Past Medical History:  Diagnosis Date  . Anxiety and depression   . Arthritis   . Broken ankle   . Cancer ([place])    skin ca  . Depression   . Diabetes mellitus without complication ([place])   . GERD (gastroesophageal reflux disease)   . Gout   . Gout   . Heart murmur   . Hypercholesteremia   . Hypertension   . Mixed incontinence   . Pneumonia    Right lower lobe pneumonia  . Thyroid condition    Past Surgical History:  Procedure Laterality Date  . ABDOMINAL HYSTERECTOMY    . APPENDECTOMY  1993  . CATARACT EXTRACTION W/PHACO Left 03/22/2017   Procedure: CATARACT EXTRACTION PHACO AND INTRAOCULAR LENS PLACEMENT ([place]) left diabetic;  Surgeon: Eulogio Bear, MD;  Location: [place];  Service: Ophthalmology;  Laterality: Left;  Diabetic  . CATARACT EXTRACTION W/PHACO Right 05/17/2017   Procedure: CATARACT EXTRACTION PHACO AND INTRAOCULAR LENS PLACEMENT ([place]) Right diabetic;  Surgeon: Eulogio Bear, MD;  Location: [place];  Service: Ophthalmology;  Laterality: Right;  . CHOLECYSTECTOMY    .  COLONOSCOPY WITH PROPOFOL N/A 11/14/2017   Procedure: COLONOSCOPY WITH PROPOFOL;  Surgeon: Lollie Sails, MD;  Location: [facility] ENDOSCOPY;  Service: Endoscopy;  Laterality: N/A;  . EYE SURGERY     cataracts with extraocular prosthesis  . KIDNEY SURGERY     gave daughter a kidney  . 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  Alert and oriented.  Affect is appropriate. BACK: No CVA tenderness.  Right flank notable for area of erythema 1.5 cm in diameter and healing skin (tick bite attachment) ABDOMEN: Soft, nontender without organomegaly PELVIC EXAM: External genitalia-normal BUS-normal Vagina-mild to moderate atrophy; second-degree cystocele present; moderate rectocele present CERVIX-surgically absent UTERUS-surgically absent BIMANUAL-no palpable masses or tenderness RECTOVAGINAL-normal external exam  PROCEDURE: #2 incontinence dish is removed, cleaned, and reinserted  ASSESSMENT: 1.  Second-degree cystocele, asymptomatic with pessary in place as patient has more complete voiding and better sensation of needing to void 2.  Mixed incontinence, stable 3.  Anxiety regarding pessary removal and reinsertion 4.  Tick bite  PLAN: 1.  Pessary maintenance-12 weeks 2.  Continue Trimosan gel intravaginal weekly 3.  Continue Premarin cream intravaginal weekly 4.  Continue using maximal lubricant  to help with ease of removal and reinsertion at each visit 5.  Doxycycline 200 mg orally x1 dose for Lyme disease prophylaxis per protocol (up to date)  Brayton Mars, MD  Note: This dictation was prepared with Dragon dictation along with smaller phrase technology. Any transcriptional errors that result from this  process are unintentional.

## 2018-07-04 ENCOUNTER — Ambulatory Visit
Admission: RE | Admit: 2018-07-04 | Discharge: 2018-07-04 | Disposition: A | Discharge status: Home / Self Care | Payer: Medicare HMO | Source: Ambulatory Visit | Attending: Internal Medicine | Admitting: Internal Medicine

## 2018-07-04 DIAGNOSIS — Z1231 Encounter for screening mammogram for malignant neoplasm of breast: Secondary | ICD-10-CM | POA: Diagnosis not present

## 2018-09-13 ENCOUNTER — Ambulatory Visit: Payer: Medicare HMO | Admitting: Obstetrics and Gynecology

## 2018-09-13 ENCOUNTER — Encounter: Payer: Self-pay | Admitting: Obstetrics and Gynecology

## 2018-09-13 VITALS — BP 108/57 | HR 56 | Ht 60.0 in | Wt 129.4 lb

## 2018-09-13 DIAGNOSIS — Z4689 Encounter for fitting and adjustment of other specified devices: Secondary | ICD-10-CM

## 2018-09-13 DIAGNOSIS — N8111 Cystocele, midline: Secondary | ICD-10-CM | POA: Diagnosis not present

## 2018-09-13 DIAGNOSIS — N3946 Mixed incontinence: Secondary | ICD-10-CM

## 2018-09-13 DIAGNOSIS — N952 Postmenopausal atrophic vaginitis: Secondary | ICD-10-CM | POA: Diagnosis not present

## 2018-09-13 NOTE — Patient Instructions (Signed)
1.  The pessary is removed, cleaned, and reinserted today. 2.  Continue using Trimosan gel intravaginal once a week 3.  Continue using Premarin cream intravaginal once a week 4.  Return in 12 weeks for pessary maintenance

## 2018-09-14 NOTE — Progress Notes (Signed)
Chief complaint: 1.  Pessary maintenance 2.  Midline cystocele 3.  Mixed incontinence 4.  Vaginal atrophy  Pessary maintenance (last visit 06/20/2018) Pessary type: #2 incontinence dish pessary with notch Medications: Trimosan gel intravaginal weekly; Premarin cream intravaginal weekly Symptoms: Patient denies vaginal bleeding, vaginal spotting, pelvic pressure, and vaginal odor.  She does report more complete emptying with voiding and urge symptoms are not as prevalent.  Bowel function is normal.  Past medical history, past surgical history, problem list, medications, and allergies are reviewed  OBJECTIVE: BP (!) 108/57   Pulse (!) 56   Ht 5' (1.524 m)   Wt 129 lb 6.4 oz (58.7 kg)   BMI 25.27 kg/m  Pleasant female in no acute distress.  Alert and oriented.  Affect is appropriate. Back: No CVA tenderness Abdomen: Soft, nontender, no organomegaly Pelvic exam: External genitalia-normal BUS-normal Vagina-mild to moderate atrophy; second-degree cystocele; moderate rectocele Cervix-surgically absent Uterus-surgically absent Bimanual-no palpable masses or tenderness Rectovaginal-normal external exam  PROCEDURE: #2 incontinence dish pessary is removed, cleaned, and reinserted  ASSESSMENT: 1.  Second-degree cystocele, asymptomatic with pessary in place 2.  Improved voiding without residual retention 3.  Mixed incontinence, decreased urge symptoms 4.  Patient is capable of managing pessary.  PLAN: 1.  Pessary maintenance-12 weeks 2.  Continue Trimosan gel intravaginal weekly 3.  Continue Premarin cream intravaginal weekly  A total of 15 minutes were spent face-to-face with the patient during this encounter and over half of that time dealt with counseling and coordination of care.  Brayton Mars, MD  Note: This dictation was prepared with Dragon dictation along with smaller phrase technology. Any transcriptional errors that result from this process are  unintentional.

## 2018-09-20 ENCOUNTER — Encounter: Payer: Medicare HMO | Admitting: Obstetrics and Gynecology

## 2018-12-02 ENCOUNTER — Ambulatory Visit (INDEPENDENT_AMBULATORY_CARE_PROVIDER_SITE_OTHER)
Admission: EM | Admit: 2018-12-02 | Discharge: 2018-12-02 | Disposition: A | Discharge status: Home / Self Care | Payer: Medicare HMO | Source: Home / Self Care | Attending: Family Medicine | Admitting: Family Medicine

## 2018-12-02 ENCOUNTER — Encounter: Payer: Self-pay | Admitting: Emergency Medicine

## 2018-12-02 ENCOUNTER — Inpatient Hospital Stay
Admission: EM | Admit: 2018-12-02 | Discharge: 2018-12-04 | DRG: 871 | Disposition: A | Discharge status: Home Health | Payer: Medicare HMO | Source: Ambulatory Visit | Attending: Internal Medicine | Admitting: Internal Medicine

## 2018-12-02 ENCOUNTER — Other Ambulatory Visit: Payer: Self-pay

## 2018-12-02 ENCOUNTER — Ambulatory Visit (INDEPENDENT_AMBULATORY_CARE_PROVIDER_SITE_OTHER): Payer: Medicare HMO

## 2018-12-02 DIAGNOSIS — J9601 Acute respiratory failure with hypoxia: Secondary | ICD-10-CM | POA: Diagnosis present

## 2018-12-02 DIAGNOSIS — Z7984 Long term (current) use of oral hypoglycemic drugs: Secondary | ICD-10-CM

## 2018-12-02 DIAGNOSIS — I10 Essential (primary) hypertension: Secondary | ICD-10-CM | POA: Diagnosis present

## 2018-12-02 DIAGNOSIS — R05 Cough: Secondary | ICD-10-CM

## 2018-12-02 DIAGNOSIS — Z7982 Long term (current) use of aspirin: Secondary | ICD-10-CM

## 2018-12-02 DIAGNOSIS — Z79899 Other long term (current) drug therapy: Secondary | ICD-10-CM

## 2018-12-02 DIAGNOSIS — R0602 Shortness of breath: Secondary | ICD-10-CM

## 2018-12-02 DIAGNOSIS — J13 Pneumonia due to Streptococcus pneumoniae: Secondary | ICD-10-CM | POA: Diagnosis present

## 2018-12-02 DIAGNOSIS — R0902 Hypoxemia: Secondary | ICD-10-CM

## 2018-12-02 DIAGNOSIS — Z87891 Personal history of nicotine dependence: Secondary | ICD-10-CM

## 2018-12-02 DIAGNOSIS — A403 Sepsis due to Streptococcus pneumoniae: Principal | ICD-10-CM | POA: Diagnosis present

## 2018-12-02 DIAGNOSIS — F419 Anxiety disorder, unspecified: Secondary | ICD-10-CM | POA: Diagnosis present

## 2018-12-02 DIAGNOSIS — Z79890 Hormone replacement therapy (postmenopausal): Secondary | ICD-10-CM | POA: Diagnosis not present

## 2018-12-02 DIAGNOSIS — J181 Lobar pneumonia, unspecified organism: Secondary | ICD-10-CM

## 2018-12-02 DIAGNOSIS — Z905 Acquired absence of kidney: Secondary | ICD-10-CM | POA: Diagnosis not present

## 2018-12-02 DIAGNOSIS — F329 Major depressive disorder, single episode, unspecified: Secondary | ICD-10-CM | POA: Diagnosis present

## 2018-12-02 DIAGNOSIS — K219 Gastro-esophageal reflux disease without esophagitis: Secondary | ICD-10-CM | POA: Diagnosis present

## 2018-12-02 DIAGNOSIS — J189 Pneumonia, unspecified organism: Secondary | ICD-10-CM

## 2018-12-02 DIAGNOSIS — Z66 Do not resuscitate: Secondary | ICD-10-CM | POA: Diagnosis present

## 2018-12-02 DIAGNOSIS — M109 Gout, unspecified: Secondary | ICD-10-CM | POA: Diagnosis present

## 2018-12-02 DIAGNOSIS — Z888 Allergy status to other drugs, medicaments and biological substances status: Secondary | ICD-10-CM | POA: Diagnosis not present

## 2018-12-02 DIAGNOSIS — E119 Type 2 diabetes mellitus without complications: Secondary | ICD-10-CM | POA: Diagnosis present

## 2018-12-02 DIAGNOSIS — Z9981 Dependence on supplemental oxygen: Secondary | ICD-10-CM

## 2018-12-02 DIAGNOSIS — J9621 Acute and chronic respiratory failure with hypoxia: Secondary | ICD-10-CM | POA: Diagnosis present

## 2018-12-02 DIAGNOSIS — E785 Hyperlipidemia, unspecified: Secondary | ICD-10-CM | POA: Diagnosis present

## 2018-12-02 DIAGNOSIS — B9689 Other specified bacterial agents as the cause of diseases classified elsewhere: Secondary | ICD-10-CM | POA: Diagnosis present

## 2018-12-02 DIAGNOSIS — M1990 Unspecified osteoarthritis, unspecified site: Secondary | ICD-10-CM | POA: Diagnosis present

## 2018-12-02 LAB — CBC WITH DIFFERENTIAL/PLATELET
Abs Immature Granulocytes: 0.02 10*3/uL (ref 0.00–0.07)
Basophils Absolute: 0 10*3/uL (ref 0.0–0.1)
Basophils Relative: 1 %
EOS ABS: 0 10*3/uL (ref 0.0–0.5)
EOS PCT: 1 %
HCT: 45 % (ref 36.0–46.0)
Hemoglobin: 14.5 g/dL (ref 12.0–15.0)
Immature Granulocytes: 0 %
Lymphocytes Relative: 24 %
Lymphs Abs: 1.6 10*3/uL (ref 0.7–4.0)
MCH: 29.2 pg (ref 26.0–34.0)
MCHC: 32.2 g/dL (ref 30.0–36.0)
MCV: 90.7 fL (ref 80.0–100.0)
Monocytes Absolute: 0.6 10*3/uL (ref 0.1–1.0)
Monocytes Relative: 10 %
Neutro Abs: 4.3 10*3/uL (ref 1.7–7.7)
Neutrophils Relative %: 64 %
Platelets: 137 10*3/uL — ABNORMAL LOW (ref 150–400)
RBC: 4.96 MIL/uL (ref 3.87–5.11)
RDW: 14.5 % (ref 11.5–15.5)
WBC: 6.6 10*3/uL (ref 4.0–10.5)
nRBC: 0 % (ref 0.0–0.2)

## 2018-12-02 LAB — EXPECTORATED SPUTUM ASSESSMENT W GRAM STAIN, RFLX TO RESP C: Special Requests: NORMAL

## 2018-12-02 LAB — C DIFFICILE QUICK SCREEN W PCR REFLEX
C Diff antigen: NEGATIVE
C Diff interpretation: NOT DETECTED
C Diff toxin: NEGATIVE

## 2018-12-02 LAB — COMPREHENSIVE METABOLIC PANEL
ALT: 18 U/L (ref 0–44)
AST: 25 U/L (ref 15–41)
Albumin: 3.7 g/dL (ref 3.5–5.0)
Alkaline Phosphatase: 53 U/L (ref 38–126)
Anion gap: 10 (ref 5–15)
BUN: 14 mg/dL (ref 8–23)
CALCIUM: 8.5 mg/dL — AB (ref 8.9–10.3)
CO2: 28 mmol/L (ref 22–32)
Chloride: 102 mmol/L (ref 98–111)
Creatinine, Ser: 0.97 mg/dL (ref 0.44–1.00)
GFR calc Af Amer: >60 mL/min (ref >60)
GFR calc non Af Amer: 57 mL/min — ABNORMAL LOW (ref >60)
Glucose, Bld: 135 mg/dL — ABNORMAL HIGH (ref 70–99)
Potassium: 3.8 mmol/L (ref 3.5–5.1)
Sodium: 140 mmol/L (ref 135–145)
TOTAL PROTEIN: 6.7 g/dL (ref 6.5–8.1)
Total Bilirubin: 0.9 mg/dL (ref 0.3–1.2)

## 2018-12-02 LAB — GLUCOSE, CAPILLARY
Glucose-Capillary: 108 mg/dL — ABNORMAL HIGH (ref 70–99)
Glucose-Capillary: 111 mg/dL — ABNORMAL HIGH (ref 70–99)
Glucose-Capillary: 122 mg/dL — ABNORMAL HIGH (ref 70–99)

## 2018-12-02 LAB — INFLUENZA PANEL BY PCR (TYPE A & B)
Influenza A By PCR: NEGATIVE
Influenza B By PCR: NEGATIVE

## 2018-12-02 LAB — EXPECTORATED SPUTUM ASSESSMENT W REFEX TO RESP CULTURE

## 2018-12-02 LAB — LACTIC ACID, PLASMA: Lactic Acid, Venous: 0.9 mmol/L (ref 0.5–1.9)

## 2018-12-02 LAB — PROCALCITONIN: Procalcitonin: <0.1 ng/mL

## 2018-12-02 MED ORDER — GUAIFENESIN-CODEINE 100-10 MG/5ML PO SOLN
10.0000 mL | ORAL | Status: DC | PRN
  Administered 2018-12-03 – 2018-12-04 (×4): 10 mL via ORAL
  Filled 2018-12-02 (×4): qty 10

## 2018-12-02 MED ORDER — SERTRALINE HCL 50 MG PO TABS
50.0000 mg | ORAL_TABLET | Freq: Every day | ORAL | Status: DC
  Administered 2018-12-02 – 2018-12-04 (×3): 50 mg via ORAL
  Filled 2018-12-02 (×3): qty 1

## 2018-12-02 MED ORDER — GABAPENTIN 300 MG PO CAPS
900.0000 mg | ORAL_CAPSULE | Freq: Every day | ORAL | Status: DC
  Administered 2018-12-02 – 2018-12-03 (×2): 900 mg via ORAL
  Filled 2018-12-02 (×2): qty 3

## 2018-12-02 MED ORDER — ACETAMINOPHEN 650 MG RE SUPP: 650.0000 mg | Freq: Four times a day (QID) | RECTAL | Status: DC | PRN

## 2018-12-02 MED ORDER — ONDANSETRON HCL 4 MG PO TABS: 4.0000 mg | ORAL_TABLET | Freq: Four times a day (QID) | ORAL | Status: DC | PRN

## 2018-12-02 MED ORDER — SODIUM CHLORIDE 0.9 % IV SOLN
500.0000 mg | INTRAVENOUS | Status: DC
  Administered 2018-12-02 – 2018-12-03 (×2): 500 mg via INTRAVENOUS
  Filled 2018-12-02 (×2): qty 500

## 2018-12-02 MED ORDER — IPRATROPIUM-ALBUTEROL 0.5-2.5 (3) MG/3ML IN SOLN
3.0000 mL | Freq: Four times a day (QID) | RESPIRATORY_TRACT | Status: DC
  Administered 2018-12-02 – 2018-12-04 (×7): 3 mL via RESPIRATORY_TRACT
  Filled 2018-12-02 (×7): qty 3

## 2018-12-02 MED ORDER — ENOXAPARIN SODIUM 40 MG/0.4ML ~~LOC~~ SOLN
40.0000 mg | SUBCUTANEOUS | Status: DC
  Administered 2018-12-02 – 2018-12-03 (×2): 40 mg via SUBCUTANEOUS
  Filled 2018-12-02 (×2): qty 0.4

## 2018-12-02 MED ORDER — ENOXAPARIN SODIUM 40 MG/0.4ML ~~LOC~~ SOLN: 40.0000 mg | SUBCUTANEOUS | Status: DC

## 2018-12-02 MED ORDER — POLYETHYLENE GLYCOL 3350 17 G PO PACK: 17.0000 g | PACK | Freq: Every day | ORAL | Status: DC | PRN

## 2018-12-02 MED ORDER — DONEPEZIL HCL 5 MG PO TABS
10.0000 mg | ORAL_TABLET | Freq: Every day | ORAL | Status: DC
  Administered 2018-12-02 – 2018-12-03 (×2): 10 mg via ORAL
  Filled 2018-12-02 (×3): qty 2

## 2018-12-02 MED ORDER — INSULIN ASPART 100 UNIT/ML ~~LOC~~ SOLN: 0.0000 [IU] | Freq: Every day | SUBCUTANEOUS | Status: DC

## 2018-12-02 MED ORDER — SODIUM CHLORIDE 0.9 % IV SOLN
2.0000 g | INTRAVENOUS | Status: DC
  Administered 2018-12-02 – 2018-12-03 (×2): 2 g via INTRAVENOUS
  Filled 2018-12-02: qty 20
  Filled 2018-12-02: qty 2
  Filled 2018-12-02: qty 20

## 2018-12-02 MED ORDER — TRAZODONE HCL 100 MG PO TABS
100.0000 mg | ORAL_TABLET | Freq: Every day | ORAL | Status: DC
  Administered 2018-12-02 – 2018-12-03 (×2): 100 mg via ORAL
  Filled 2018-12-02 (×2): qty 1

## 2018-12-02 MED ORDER — INSULIN ASPART 100 UNIT/ML ~~LOC~~ SOLN
0.0000 [IU] | Freq: Three times a day (TID) | SUBCUTANEOUS | Status: DC
  Administered 2018-12-03 (×2): 2 [IU] via SUBCUTANEOUS
  Administered 2018-12-04: 1 [IU] via SUBCUTANEOUS
  Filled 2018-12-02 (×2): qty 1

## 2018-12-02 MED ORDER — ACETAMINOPHEN 325 MG PO TABS
650.0000 mg | ORAL_TABLET | Freq: Four times a day (QID) | ORAL | Status: DC | PRN
  Administered 2018-12-02: 650 mg via ORAL
  Filled 2018-12-02: qty 2

## 2018-12-02 MED ORDER — ONDANSETRON HCL 4 MG/2ML IJ SOLN: 4.0000 mg | Freq: Four times a day (QID) | INTRAMUSCULAR | Status: DC | PRN

## 2018-12-02 MED ORDER — BENZONATATE 100 MG PO CAPS: 200.0000 mg | ORAL_CAPSULE | Freq: Three times a day (TID) | ORAL | Status: DC | PRN

## 2018-12-02 MED ORDER — ALLOPURINOL 100 MG PO TABS
200.0000 mg | ORAL_TABLET | Freq: Every day | ORAL | Status: DC
  Administered 2018-12-02 – 2018-12-04 (×3): 200 mg via ORAL
  Filled 2018-12-02 (×3): qty 2

## 2018-12-02 MED ORDER — ASPIRIN EC 81 MG PO TBEC
81.0000 mg | DELAYED_RELEASE_TABLET | Freq: Every day | ORAL | Status: DC
  Administered 2018-12-02 – 2018-12-04 (×3): 81 mg via ORAL
  Filled 2018-12-02 (×3): qty 1

## 2018-12-02 MED ORDER — SIMVASTATIN 20 MG PO TABS
20.0000 mg | ORAL_TABLET | Freq: Every evening | ORAL | Status: DC
  Administered 2018-12-02 – 2018-12-03 (×2): 20 mg via ORAL
  Filled 2018-12-02 (×3): qty 1

## 2018-12-02 MED ORDER — VITAMIN D 25 MCG (1000 UNIT) PO TABS
2000.0000 [IU] | ORAL_TABLET | Freq: Every day | ORAL | Status: DC
  Administered 2018-12-02 – 2018-12-04 (×3): 2000 [IU] via ORAL
  Filled 2018-12-02 (×3): qty 2

## 2018-12-02 NOTE — ED Triage Notes (Signed)
Patient c/o cough and chest congestion for 3 weeks. Patient reports chills but denies fevers.

## 2018-12-02 NOTE — Discharge Instructions (Signed)
Recommend patient go to Emergency Department for further evaluation and management °

## 2018-12-02 NOTE — H&P (Addendum)
[facility] NAME: Candice Cook    MR#:  [id number]  DATE OF BIRTH:  Apr 27, 1941  DATE OF ADMISSION:  12/02/2018  PRIMARY CARE PHYSICIAN: Ezequiel Kayser, MD   REQUESTING/REFERRING PHYSICIAN: Larae Grooms, MD  CHIEF COMPLAINT:   Chief Complaint  Patient presents with  . Shortness of Breath    HISTORY OF PRESENT ILLNESS:  Candice Cook  is a 77 y.o. female with a known history of hypertension, hyperlipidemia, type 2 diabetes, anxiety, depression who was sent to the ED from urgent care due to hypoxia to 86% on room air.  She has been having productive cough and shortness of breath over the last 3 weeks her cough is productive of clear sputum.  Her shortness of breath and cough has been getting progressively worse, so she went to urgent care this morning for further evaluation.  She denies any fevers at home.  She endorses chills.  She denies any chest pain or lower extremity edema.  In the ED, she was hypoxic to 88% on room air.  She was meeting sepsis criteria with tachycardia and tachypnea.  She was afebrile.  Labs were unremarkable.  Chest x-ray showed a right lower lobe pneumonia.  She was placed on 2 L of oxygen and given broad-spectrum antibiotics.  Hospitalists were called for admission.  PAST MEDICAL HISTORY:   Past Medical History:  Diagnosis Date  . Anxiety and depression   . Arthritis   . Broken ankle   . Cancer ([place])    skin ca  . Depression   . Diabetes mellitus without complication ([place])   . GERD (gastroesophageal reflux disease)   . Gout   . Gout   . Heart murmur   . Hypercholesteremia   . Hypertension   . Mixed incontinence   . Pneumonia    Right lower lobe pneumonia  . Thyroid condition     PAST SURGICAL HISTORY:   Past Surgical History:  Procedure Laterality Date  . ABDOMINAL HYSTERECTOMY    . APPENDECTOMY  1993  . CATARACT EXTRACTION W/PHACO Left 03/22/2017   Procedure: CATARACT EXTRACTION PHACO  AND INTRAOCULAR LENS PLACEMENT ([place]) left diabetic;  Surgeon: Eulogio Bear, MD;  Location: [place];  Service: Ophthalmology;  Laterality: Left;  Diabetic  . CATARACT EXTRACTION W/PHACO Right 05/17/2017   Procedure: CATARACT EXTRACTION PHACO AND INTRAOCULAR LENS PLACEMENT ([place]) Right diabetic;  Surgeon: Eulogio Bear, MD;  Location: [place];  Service: Ophthalmology;  Laterality: Right;  . CHOLECYSTECTOMY    . COLONOSCOPY WITH PROPOFOL N/A 11/14/2017   Procedure: COLONOSCOPY WITH PROPOFOL;  Surgeon: Lollie Sails, MD;  Location: [facility] ENDOSCOPY;  Service: Endoscopy;  Laterality: N/A;  . EYE SURGERY     cataracts with extraocular prosthesis  . KIDNEY SURGERY     gave daughter a kidney  . 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 Smoking status: Former Research scientist (life sciences)  . Smokeless tobacco: Never Used  . Tobacco comment: quit 25 years  Substance Use Topics  . Alcohol use: No    Alcohol/week: 0.0 standard drinks    FAMILY HISTORY:   Family History  Problem Relation Age of Onset  . Heart disease Mother   . Heart disease Father   . Diabetes Father   . Hypertension Father   . Cancer Brother   . Kidney disease Daughter   . Bladder  Cancer Neg Hx   . Kidney cancer Neg Hx   . Breast cancer Neg Hx     DRUG ALLERGIES:   Allergies  Allergen Reactions  . Mirabegron Other (See Comments)    Severe tremor, headache  . Metformin And Related Diarrhea    REVIEW OF SYSTEMS:   Review of Systems  Constitutional: Positive for chills. Negative for fever.  HENT: Positive for congestion. Negative for sore throat.   Eyes: Negative for blurred vision and double vision.  Respiratory: Positive for cough, sputum production and shortness of breath.   Cardiovascular: Negative for chest pain, palpitations and leg swelling.  Gastrointestinal: Negative for abdominal pain, nausea and vomiting.  Genitourinary:  Negative for dysuria and urgency.  Musculoskeletal: Negative for back pain and neck pain.  Neurological: Negative for dizziness and headaches.  Psychiatric/Behavioral: Negative for depression. The patient is not nervous/anxious.     MEDICATIONS AT HOME:   Prior to Admission medications   Medication Sig Start Date End Date Taking? Authorizing Provider  allopurinol (ZYLOPRIM) 100 MG tablet Take 200 mg daily by mouth.    [provider]  aspirin EC 81 MG tablet Take 81 mg by mouth daily.    [provider]  Blood Glucose Calibration (LIBERTY GLUCOSE CONTROL) Normal LIQD Use as directed. 02/20/14   [provider]  Cholecalciferol (VITAMIN D-1000 MAX ST) 1000 units tablet Take 2,000 Units daily by mouth.     [provider]  conjugated estrogens (PREMARIN) vaginal cream Place 8.52 Applicatorfuls vaginally 2 (two) times a week. 12/29/17   Defrancesco, Alanda Slim, MD  Cyanocobalamin 2500 MCG SUBL Place under the tongue.    [provider]  donepezil (ARICEPT) 10 MG tablet Take 10 mg by mouth at bedtime.    [provider]  gabapentin (NEURONTIN) 300 MG capsule Take by mouth. 03/15/16   [provider]  glimepiride (AMARYL) 2 MG tablet Take 2 mg by mouth daily with breakfast.    [provider]  indomethacin (INDOCIN) 25 MG capsule Take 25 mg by mouth. Reported on 05/17/2016 03/08/12   [provider]  Lancets (ONETOUCH ULTRASOFT) lancets USE 1 EACH ONCE DAILY. USE AS INSTRUCTED. ONE TOUCH LANCETS 02/20/16   [provider]  lisinopril-hydrochlorothiazide (PRINZIDE,ZESTORETIC) 20-12.5 MG tablet TAKE 1 TABLET BY MOUTH EVERY MORNING. FOR BLOOD PRESSURE 03/09/18   [provider]  loperamide (IMODIUM) 2 MG capsule Take by mouth.    [provider]  omeprazole (PRILOSEC) 40 MG capsule Take 40 mg by mouth daily.    [provider]  ONE TOUCH ULTRA TEST test strip USE ONCE DAILY. AS INSTRUCTED.  02/20/16   [provider]  Oxyquinoline-Sod Lauryl Sulf (TRIMO-SAN) 0.025-0.01 % GEL Place vaginally.    [provider]  sertraline (ZOLOFT) 50 MG tablet Take 50 mg by mouth. 02/12/13   [provider]  simvastatin (ZOCOR) 20 MG tablet Take 20 mg daily by mouth.    [provider]  traZODone (DESYREL) 50 MG tablet Take 100 mg by mouth at bedtime.     [provider]      VITAL SIGNS:  Blood pressure (!) 118/58, pulse (!) 107, temperature 99.1 F (37.3 C), temperature source Oral, resp. rate (!) 24, weight 59 kg, SpO2 (!) 88 %.  PHYSICAL EXAMINATION:  Physical Exam  GENERAL:  77 y.o.-year-old patient lying in the bed with no acute distress.  EYES: Pupils equal, round, reactive to light and accommodation. No scleral icterus. Extraocular muscles intact.  HEENT:  Head atraumatic, normocephalic. Oropharynx and nasopharynx clear.  Moist mucous membranes. NECK:  Supple, no jugular venous distention. No thyroid enlargement, no tenderness.  LUNGS: + Diminished breath sounds throughout all lung fields.  No wheezing, rales,rhonchi or crepitation. No use of accessory muscles of respiration.  Able to speak in full sentences.  Nasal cannula in place. CARDIOVASCULAR: Tachycardic, regular rhythm, S1, S2 normal. No murmurs, rubs, or gallops.  ABDOMEN: Soft, nontender, nondistended. Bowel sounds present. No organomegaly or mass.  EXTREMITIES: No pedal edema, cyanosis, or clubbing.  NEUROLOGIC: Cranial nerves II through XII are intact. Muscle strength 5/5 in all extremities. Sensation intact. Gait not checked.  PSYCHIATRIC: The patient is alert and oriented x 3.  SKIN: No obvious rash, lesion, or ulcer.   LABORATORY PANEL:   CBC No results for input(s): WBC, HGB, HCT, PLT in the last 168 hours. ------------------------------------------------------------------------------------------------------------------  Chemistries  No results for input(s): NA, K, CL,  CO2, GLUCOSE, BUN, CREATININE, CALCIUM, MG, AST, ALT, ALKPHOS, BILITOT in the last 168 hours.  Invalid input(s): GFRCGP ------------------------------------------------------------------------------------------------------------------  Cardiac Enzymes No results for input(s): TROPONINI in the last 168 hours. ------------------------------------------------------------------------------------------------------------------  RADIOLOGY:  Dg Chest 2 View  Result Date: 12/02/2018 CLINICAL DATA:  Shortness of breath, cough 3 weeks EXAM: CHEST - 2 VIEW COMPARISON:  02/18/2013 FINDINGS: There is hazy right lower lobe airspace disease concerning for pneumonia. There is no pleural effusion or pneumothorax. The heart and mediastinal contours are unremarkable. The osseous structures are unremarkable. IMPRESSION: Hazy right lower lobe airspace disease concerning for pneumonia. Followup PA and lateral chest X-ray is recommended in 3-4 weeks following trial of antibiotic therapy to ensure resolution and exclude underlying malignancy. Electronically Signed   By: Kathreen Devoid   On: 12/02/2018 08:53      IMPRESSION AND PLAN:   Acute hypoxic respiratory failure-secondary to community-acquired pneumonia.  Requiring 2 L of oxygen by [place]. -Duonebs prn -Wean O2 as able -Ambulate with pulse ox prior to discharge  Sepsis secondary to community-acquired pneumonia- patient meeting sepsis criteria due to tachypneic and tachycardic in the ED.  Chest x-ray with right lower lobe infiltrate. Lactic acid normal. -Continue ceftriaxone and azithromycin -Follow-up blood and sputum cultures -Check procalcitonin -Check urinary strep pneumo and legionella antigens -Needs repeat chest x-ray in 3 to 4 weeks to ensure resolution and exclude underlying malignancy  Hypertension- BPs soft in the ED -Hold lisinopril-HCTZ for now -Can restart if BPs improve  Type 2 diabetes-stable.  Recent A1c 6.5. 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 Management plans discussed with the patient, family and they are in agreement.  CODE STATUS: DNR  TOTAL TIME TAKING CARE OF THIS PATIENT: 45 minutes.    Berna Spare Camesha Farooq M.D on 12/02/2018 at 10:21 AM  Between 7am to 6pm - Pager - [phone]  After 6pm go to [email or web address] - Technical brewer [facility]  Office  [phone]  CC: Primary care physician; Ezequiel Kayser, MD   Note: This dictation was prepared with Dragon dictation along with smaller phrase technology. Any transcriptional errors that result from this process are unintentional.

## 2018-12-02 NOTE — ED Provider Notes (Signed)
[facility]    CSN: [id number] Arrival date & time: 12/02/18  0801     History   Chief Complaint Chief Complaint  Patient presents with  . Cough    HPI Candice Cook is a 77 y.o. female.   77 yo female with diabetes presents with a c/o cough and chest congestion for 3 weeks and associated with chills and shortness of breath.   The history is provided by the patient.  Cough    Past Medical History:  Diagnosis Date  . Anxiety and depression   . Arthritis   . Broken ankle   . Cancer ([place])    skin ca  . Depression   . Diabetes mellitus without complication ([place])   . GERD (gastroesophageal reflux disease)   . Gout   . Gout   . Heart murmur   . Hypercholesteremia   . Hypertension   . Mixed incontinence   . Pneumonia    Right lower lobe pneumonia  . Thyroid condition     Patient Active Problem List   Diagnosis Date Noted  . Tick bite 06/20/2018  . Pessary maintenance 01/11/2018  . Cellulitis and abscess of right lower extremity 12/28/2017  . Vaginal polyp 12/12/2017  . Vaginal atrophy 12/12/2017  . Cystocele, midline 12/12/2017  . Mixed incontinence 03/23/2016    Past Surgical History:  Procedure Laterality Date  . ABDOMINAL HYSTERECTOMY    . APPENDECTOMY  1993  . CATARACT EXTRACTION W/PHACO Left 03/22/2017   Procedure: CATARACT EXTRACTION PHACO AND INTRAOCULAR LENS PLACEMENT ([place]) left diabetic;  Surgeon: Eulogio Bear, MD;  Location: [place];  Service: Ophthalmology;  Laterality: Left;  Diabetic  . CATARACT EXTRACTION W/PHACO Right 05/17/2017   Procedure: CATARACT EXTRACTION PHACO AND INTRAOCULAR LENS PLACEMENT ([place]) Right diabetic;  Surgeon: Eulogio Bear, MD;  Location: [place];  Service: Ophthalmology;  Laterality: Right;  . CHOLECYSTECTOMY    . COLONOSCOPY WITH PROPOFOL N/A 11/14/2017   Procedure: COLONOSCOPY WITH PROPOFOL;  Surgeon: Lollie Sails, MD;  Location: [facility] ENDOSCOPY;  Service: Endoscopy;   Laterality: N/A;  . EYE SURGERY     cataracts with extraocular prosthesis  . KIDNEY SURGERY     gave daughter a kidney  . 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 Authorizing Provider  allopurinol (ZYLOPRIM) 100 MG tablet Take 200 mg daily by mouth.   Yes [provider]  aspirin EC 81 MG tablet Take 81 mg by mouth daily.   Yes [provider]  Cholecalciferol (VITAMIN D-1000 MAX ST) 1000 units tablet Take 2,000 Units daily by mouth.    Yes [provider]  conjugated estrogens (PREMARIN) vaginal cream Place 7.82 Applicatorfuls vaginally 2 (two) times a week. 12/29/17  Yes Defrancesco, Alanda Slim, MD  Cyanocobalamin 2500 MCG SUBL Place under the tongue.   Yes [provider]  donepezil (ARICEPT) 10 MG tablet Take 10 mg by  mouth at bedtime.   Yes [provider]  gabapentin (NEURONTIN) 300 MG capsule Take by mouth. 03/15/16  Yes [provider]  glimepiride (AMARYL) 2 MG tablet Take 2 mg by mouth daily with breakfast.   Yes [provider]  indomethacin (INDOCIN) 25 MG capsule Take 25 mg by mouth. Reported on 05/17/2016 03/08/12  Yes [provider]  lisinopril-hydrochlorothiazide (PRINZIDE,ZESTORETIC) 20-12.5 MG tablet TAKE 1 TABLET BY MOUTH EVERY MORNING. FOR BLOOD PRESSURE 03/09/18  Yes [provider]  omeprazole (PRILOSEC) 40 MG capsule Take 40 mg by mouth daily.   Yes [provider]  sertraline (ZOLOFT) 50 MG tablet Take 50 mg by mouth. 02/12/13  Yes [provider]  simvastatin (ZOCOR) 20 MG tablet Take 20 mg daily by mouth.   Yes [provider]  traZODone (DESYREL) 50 MG tablet Take 100 mg by mouth at  bedtime.    Yes [provider]  Blood Glucose Calibration (LIBERTY GLUCOSE CONTROL) Normal LIQD Use as directed. 02/20/14   [provider]  Lancets (ONETOUCH ULTRASOFT) lancets USE 1 EACH ONCE DAILY. USE AS INSTRUCTED. ONE TOUCH LANCETS 02/20/16   [provider]  loperamide (IMODIUM) 2 MG capsule Take by mouth.    [provider]  ONE TOUCH ULTRA TEST test strip USE ONCE DAILY. AS INSTRUCTED. 02/20/16   [provider]  Oxyquinoline-Sod Lauryl Sulf (TRIMO-SAN) 0.025-0.01 % GEL Place vaginally.    [provider]    Family History Family History  Problem Relation Age of Onset  . Heart disease Mother   . Heart disease Father   . Diabetes Father   . Hypertension Father   . Cancer Brother   . Kidney disease Daughter   . Bladder Cancer Neg Hx   . Kidney cancer Neg Hx   . Breast cancer Neg Hx     Social History Social History   Tobacco Use  . Smoking status: Former Research scientist (life sciences)  . Smokeless tobacco: Never Used  . Tobacco comment: quit 25 years  Substance Use Topics  . Alcohol use: No    Alcohol/week: 0.0 standard drinks  . Drug use: No     Allergies   Mirabegron and Metformin and related   Review of Systems Review of Systems  Respiratory: Positive for cough.      Physical Exam Triage Vital Signs ED Triage Vitals  Enc Vitals Group     BP 12/02/18 0813 130/60     Pulse Rate 12/02/18 0813 98     Resp 12/02/18 0813 14     Temp 12/02/18 0813 98.2 F (36.8 C)     Temp Source 12/02/18 0813 Oral     SpO2 12/02/18 0813 (S) 90 %     Weight 12/02/18 0808 130 lb (59 kg)     Height 12/02/18 0808 5' (1.524 m)     Head Circumference --      Peak Flow --      Pain Score 12/02/18 0808 8     Pain Loc --      Pain Edu? --      Excl. in [place]? --    No data found.  Updated Vital Signs BP 130/60 (BP Location: Left Arm)   Pulse (!) 130   Temp 98.2 F (36.8 C) (Oral)   Resp 14   Ht 5' (1.524 m)   Wt 59 kg   SpO2 (!) 86%    BMI 25.39 kg/m   Visual Acuity Right Eye Distance:   Left Eye  Distance:   Bilateral Distance:    Right Eye Near:   Left Eye Near:    Bilateral Near:     Physical Exam  Constitutional: She appears well-developed and well-nourished. No distress.  Cardiovascular: Normal rate, regular rhythm and normal heart sounds.  Pulmonary/Chest: Effort normal. No stridor. No respiratory distress. She has no wheezes. She has rales (bilateral).  Skin: She is not diaphoretic.  Nursing note and vitals reviewed.    UC Treatments / Results  Labs (all labs ordered are listed, but only abnormal results are displayed) Labs Reviewed - No data to display  EKG None  Radiology Dg Chest 2 View  Result Date: 12/02/2018 CLINICAL DATA:  Shortness of breath, cough 3 weeks EXAM: CHEST - 2 VIEW COMPARISON:  02/18/2013 FINDINGS: There is hazy right lower lobe airspace disease concerning for pneumonia. There is no pleural effusion or pneumothorax. The heart and mediastinal contours are unremarkable. The osseous structures are unremarkable. IMPRESSION: Hazy right lower lobe airspace disease concerning for pneumonia. Followup PA and lateral chest X-ray is recommended in 3-4 weeks following trial of antibiotic therapy to ensure resolution and exclude underlying malignancy. Electronically Signed   By: Kathreen Devoid   On: 12/02/2018 08:53    Procedures Procedures (including critical care time)  Medications Ordered in UC Medications - No data to display  Initial Impression / Assessment and Plan / UC Course  I have reviewed the triage vital signs and the nursing notes.  Pertinent labs & imaging results that were available during my care of the patient were reviewed by me and considered in my medical decision making (see chart for details).      Final Clinical Impressions(s) / UC Diagnoses   Final diagnoses:  Community acquired pneumonia of right lower lobe of lung ([place])  Hypoxia     Discharge Instructions      Recommend patient go to Emergency Department for further evaluation and management    ED Prescriptions    None     1. x-ray results and diagnosis reviewed with patient and husband; recommend patient go to Emergency Department for further evaluation and management; report called to triage RN at [facility] ED  Controlled Substance Prescriptions [place] Controlled Substance Registry consulted? Not Applicable   Norval Gable, MD 12/02/18 [phone]

## 2018-12-02 NOTE — ED Triage Notes (Signed)
Pt to ed with c/o cough and congestion and sob.  Pt sent here from [facility] for pneumonia. Pt sats 88% on RA.

## 2018-12-02 NOTE — Progress Notes (Signed)
Family Meeting Note  Advance Directive:yes  Today a meeting took place with the Patient and spouse.  Patient is able to participate.  The following clinical team members were present during this meeting:MD  The following were discussed:Patient's diagnosis: , Patient's progosis: Unable to determine and Goals for treatment: DNR   Patient states that she has generally pretty healthy.  She has no underlying lung or heart disease.  However, when discussing code status, patient states that she would not want to be resuscitated due to her age.  She feels that she has lived a long and happy life and she thinks that resuscitation will cause more harm than good.  Will make patient DNR.  Additional follow-up to be provided: prn  Time spent during discussion:20 minutes  Evette Doffing, MD

## 2018-12-02 NOTE — ED Provider Notes (Addendum)
[facility] Emergency Department Provider Note  ___________________________________________   First MD Initiated Contact with Patient 12/02/18 [phone]     (approximate)  I have reviewed the triage vital signs and the nursing notes.   HISTORY  Chief Complaint Shortness of Breath   HPI Candice Cook is a 77 y.o. female who is presenting to the emergency department with 3 weeks of cough.  She was sent to the emergency department from urgent care after being found to have a right lower lobe pneumonia as well as hypoxia.  She was found to be 86% at urgent care and was sent to the emergency department here where she was recorded at 88% in triage.  She does not have emphysema.  She is not on any home oxygen.  Says that she is also having generalized weakness.  Denies any pain, fever or sick contacts.  Says that her cough worsens when she lies back.  Is not having any hemoptysis.  Says that she is just coughing up "mucus."   Past Medical History:  Diagnosis Date  . Anxiety and depression   . Arthritis   . Broken ankle   . Cancer ([place])    skin ca  . Depression   . Diabetes mellitus without complication ([place])   . GERD (gastroesophageal reflux disease)   . Gout   . Gout   . Heart murmur   . Hypercholesteremia   . Hypertension   . Mixed incontinence   . Pneumonia    Right lower lobe pneumonia  . Thyroid condition     Patient Active Problem List   Diagnosis Date Noted  . Tick bite 06/20/2018  . Pessary maintenance 01/11/2018  . Cellulitis and abscess of right lower extremity 12/28/2017  . Vaginal polyp 12/12/2017  . Vaginal atrophy 12/12/2017  . Cystocele, midline 12/12/2017  . Mixed incontinence 03/23/2016    Past Surgical History:  Procedure Laterality Date  . ABDOMINAL HYSTERECTOMY    . APPENDECTOMY  1993  . CATARACT EXTRACTION W/PHACO Left 03/22/2017   Procedure: CATARACT EXTRACTION PHACO AND INTRAOCULAR LENS PLACEMENT ([place]) left diabetic;   Surgeon: Eulogio Bear, MD;  Location: [place];  Service: Ophthalmology;  Laterality: Left;  Diabetic  . CATARACT EXTRACTION W/PHACO Right 05/17/2017   Procedure: CATARACT EXTRACTION PHACO AND INTRAOCULAR LENS PLACEMENT ([place]) Right diabetic;  Surgeon: Eulogio Bear, MD;  Location: [place];  Service: Ophthalmology;  Laterality: Right;  . CHOLECYSTECTOMY    . COLONOSCOPY WITH PROPOFOL N/A 11/14/2017   Procedure: COLONOSCOPY WITH PROPOFOL;  Surgeon: Lollie Sails, MD;  Location: [facility] ENDOSCOPY;  Service: Endoscopy;  Laterality: N/A;  . EYE SURGERY     cataracts with extraocular prosthesis  . KIDNEY SURGERY     gave daughter a kidney  . 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 Authorizing Provider  allopurinol (ZYLOPRIM) 100 MG tablet Take 200 mg daily by mouth.   Yes [provider]  aspirin EC 81 MG tablet Take 81 mg by mouth daily.   Yes [provider]  Blood Glucose Calibration (LIBERTY GLUCOSE CONTROL) Normal LIQD Use as directed. 02/20/14  Yes [provider]  Cholecalciferol (VITAMIN D-1000 MAX ST) 1000 units tablet Take 2,000 Units daily by mouth.    Yes [provider]  conjugated estrogens (PREMARIN) vaginal cream Place 6.22 Applicatorfuls vaginally 2 (two) times a week. 12/29/17  Yes  Defrancesco, Alanda Slim, MD  Cyanocobalamin 2500 MCG SUBL Place under the tongue.   Yes [provider]  donepezil (ARICEPT) 10 MG tablet Take 10 mg by mouth at bedtime.   Yes [provider]  gabapentin (NEURONTIN) 300 MG capsule Take 900 mg by mouth at bedtime.  03/15/16  Yes [provider]  glimepiride (AMARYL) 2 MG tablet Take 2 mg by mouth daily with breakfast.   Yes [provider]  indomethacin (INDOCIN) 25 MG capsule Take 25 mg by mouth daily. Reported on 05/17/2016 03/08/12  Yes [provider]    Lancets (ONETOUCH ULTRASOFT) lancets USE 1 EACH ONCE DAILY. USE AS INSTRUCTED. ONE TOUCH LANCETS 02/20/16  Yes [provider]  lisinopril-hydrochlorothiazide (PRINZIDE,ZESTORETIC) 20-12.5 MG tablet TAKE 1 TABLET BY MOUTH EVERY MORNING. FOR BLOOD PRESSURE 03/09/18  Yes [provider]  loperamide (IMODIUM) 2 MG capsule Take 2 mg by mouth as needed.    Yes [provider]  omeprazole (PRILOSEC) 40 MG capsule Take 40 mg by mouth daily.   Yes [provider]  ONE TOUCH ULTRA TEST test strip USE ONCE DAILY. AS INSTRUCTED. 02/20/16  Yes [provider]  Oxyquinoline-Sod Lauryl Sulf (TRIMO-SAN) 0.025-0.01 % GEL Place vaginally.   Yes [provider]  sertraline (ZOLOFT) 50 MG tablet Take 50 mg by mouth daily.  02/12/13  Yes [provider]  simvastatin (ZOCOR) 20 MG tablet Take 20 mg daily by mouth.   Yes [provider]  traZODone (DESYREL) 50 MG tablet Take 100 mg by mouth at bedtime.    Yes [provider]    Allergies Mirabegron and Metformin and related  Family History  Problem Relation Age of Onset  . Heart disease Mother   . Heart disease Father   . Diabetes Father   . Hypertension Father   . Cancer Brother   . Kidney disease Daughter   . Bladder Cancer Neg Hx   . Kidney cancer Neg Hx   . Breast cancer Neg Hx     Social History Social History   Tobacco Use  . Smoking status: Former Research scientist (life sciences)  . Smokeless tobacco: Never Used  . Tobacco comment: quit 25 years  Substance Use Topics  . Alcohol use: No    Alcohol/week: 0.0 standard drinks  . Drug use: No    Review of Systems  Constitutional: No fever/chills Eyes: No visual changes. ENT: No sore throat. Cardiovascular: Denies chest pain. Respiratory: As above. Gastrointestinal: No abdominal pain.  No nausea, no vomiting.  No diarrhea.  No constipation. Genitourinary: Negative for dysuria. Musculoskeletal: Negative for back pain. Skin: Negative for  rash. Neurological: Negative for headaches, focal weakness or numbness.   ____________________________________________   PHYSICAL EXAM:  VITAL SIGNS: ED Triage Vitals  Enc Vitals Group     BP 12/02/18 0944 (!) 118/58     Pulse Rate 12/02/18 0944 (!) 107     Resp 12/02/18 0944 (!) 24     Temp 12/02/18 0944 99.1 F (37.3 C)     Temp Source 12/02/18 0944 Oral     SpO2 12/02/18 0944 (!) 88 %     Weight 12/02/18 0945 130 lb 1.1 oz (59 kg)     Height --      Head Circumference --      Peak Flow --      Pain Score 12/02/18 0945 8     Pain Loc --      Pain Edu? --      Excl.  in [place]? --     Constitutional: Alert and oriented. Well appearing and in no acute distress. Eyes: Conjunctivae are normal.  Head: Atraumatic. Nose: No congestion/rhinnorhea. Mouth/Throat: Mucous membranes are moist.  Neck: No stridor.   Cardiovascular: Tachycardic, regular rhythm. Grossly normal heart sounds.  Good peripheral circulation. Respiratory: Tachypneic.  Speaking in full sentences.  No respiratory distress.  Rales to the right lower field. Gastrointestinal: Soft and nontender. No distention. No CVA tenderness. Musculoskeletal: No lower extremity tenderness nor edema.  No joint effusions. Neurologic:  Normal speech and language. No gross focal neurologic deficits are appreciated. Skin:  Skin is warm, dry and intact. No rash noted. Psychiatric: Mood and affect are normal. Speech and behavior are normal.  ____________________________________________   LABS (all labs ordered are listed, but only abnormal results are displayed)  Labs Reviewed  COMPREHENSIVE METABOLIC PANEL - Abnormal; Notable for the following components:      Result Value   Glucose, Bld 135 (*)    Calcium 8.5 (*)    GFR calc non Af Amer 57 (*)    All other components within normal limits  CBC WITH DIFFERENTIAL/PLATELET - Abnormal; Notable for the following components:   Platelets 137 (*)    All other components within normal  limits  CULTURE, BLOOD (ROUTINE X 2)  CULTURE, BLOOD (ROUTINE X 2)  LACTIC ACID, PLASMA  URINALYSIS, ROUTINE W REFLEX MICROSCOPIC  LACTIC ACID, PLASMA   ____________________________________________  EKG  ED ECG REPORT I, Doran Stabler, the attending physician, personally viewed and interpreted this ECG.   Date: 12/02/2018  EKG Time: 1036  Rate: 68  Rhythm: normal sinus rhythm  Axis: Normal  Intervals:none  ST&T Change: No ST segment elevation or depression.  No abnormal T wave inversion.  ____________________________________________  RADIOLOGY  Hazy opacity of the right lower field on the chest x-ray ____________________________________________   PROCEDURES  Procedure(s) performed:   .Critical Care Performed by: Orbie Pyo, MD Authorized by: Orbie Pyo, MD   Critical care provider statement:    Critical care time (minutes):  35   Critical care time was exclusive of:  Separately billable procedures and treating other patients   Critical care was necessary to treat or prevent imminent or life-threatening deterioration of the following conditions:  Sepsis   Critical care was time spent personally by me on the following activities:  Development of treatment plan with patient or surrogate, discussions with consultants, evaluation of patient's response to treatment, examination of patient, obtaining history from patient or surrogate, ordering and performing treatments and interventions, ordering and review of laboratory studies, ordering and review of radiographic studies, pulse oximetry, re-evaluation of patient's condition and review of old charts    Critical Care performed:    ____________________________________________   INITIAL IMPRESSION / [place] / ED COURSE  Pertinent labs & imaging results that were available during my care of the patient were reviewed by me and considered in my medical decision making (see chart  for details).  Differential includes, but is not limited to, viral syndrome, bronchitis including COPD exacerbation, pneumonia, reactive airway disease including asthma, CHF including exacerbation with or without pulmonary/interstitial edema, pneumothorax, ACS, thoracic trauma, and pulmonary embolism. As part of my medical decision making, I reviewed the following data within the electronic MEDICAL RECORD NUMBER Notes from prior ED visits  ----------------------------------------- 10:03 AM on 12/02/2018 -----------------------------------------  Patient at this time with evidence of pneumonia on chest x-ray.  Meets sirs criteria with tachypnea as well as  tachycardia.  Patient is hypoxic and on nasal cannula oxygen.  On nasal cannula oxygen the patient is now saturating in the 90s.  She will be admitted to the hospital.  Will be treated for community-acquired pneumonia.  He is understanding of the diagnosis as well as treatment and willing to comply.  Signed out to the hospitalist. ____________________________________________   FINAL CLINICAL IMPRESSION(S) / ED DIAGNOSES  Hypoxia.  Community-acquired pneumonia.  NEW MEDICATIONS STARTED DURING THIS VISIT:  New Prescriptions   No medications on file     Note:  This document was prepared using Dragon voice recognition software and may include unintentional dictation errors.     Orbie Pyo, MD 12/02/18 [place], Randall An, MD 12/02/18 1040

## 2018-12-02 NOTE — Progress Notes (Signed)
CODE SEPSIS - PHARMACY COMMUNICATION  **Broad Spectrum Antibiotics should be administered within 1 hour of Sepsis diagnosis**  Time Code Sepsis Called/Page Received: @ 1013  Antibiotics Ordered: Ceftriaxone and Azithromycin   Time of 1st antibiotic administration: @ 1040  Additional action taken by pharmacy: N/A  If necessary, Name of Provider/Nurse Contacted: N/A   Pernell Dupre, PharmD, BCPS Clinical Pharmacist 12/02/2018 10:56 AM

## 2018-12-02 NOTE — ED Notes (Signed)
Pt sats 89% on room air.  Pt placed on 2L O2, sats increased to 94%.  Dr. Clearnce Hasten aware.

## 2018-12-03 LAB — CBC
HCT: 38.9 % (ref 36.0–46.0)
Hemoglobin: 12.6 g/dL (ref 12.0–15.0)
MCH: 29.4 pg (ref 26.0–34.0)
MCHC: 32.4 g/dL (ref 30.0–36.0)
MCV: 90.9 fL (ref 80.0–100.0)
PLATELETS: 117 10*3/uL — AB (ref 150–400)
RBC: 4.28 MIL/uL (ref 3.87–5.11)
RDW: 14.5 % (ref 11.5–15.5)
WBC: 4.3 10*3/uL (ref 4.0–10.5)
nRBC: 0 % (ref 0.0–0.2)

## 2018-12-03 LAB — BASIC METABOLIC PANEL
Anion gap: 10 (ref 5–15)
BUN: 13 mg/dL (ref 8–23)
CALCIUM: 8.4 mg/dL — AB (ref 8.9–10.3)
CO2: 28 mmol/L (ref 22–32)
Chloride: 102 mmol/L (ref 98–111)
Creatinine, Ser: 0.86 mg/dL (ref 0.44–1.00)
GFR calc Af Amer: >60 mL/min (ref >60)
GFR calc non Af Amer: >60 mL/min (ref >60)
Glucose, Bld: 124 mg/dL — ABNORMAL HIGH (ref 70–99)
Potassium: 3.5 mmol/L (ref 3.5–5.1)
SODIUM: 140 mmol/L (ref 135–145)

## 2018-12-03 LAB — PROCALCITONIN

## 2018-12-03 LAB — GLUCOSE, CAPILLARY
GLUCOSE-CAPILLARY: 112 mg/dL — AB (ref 70–99)
Glucose-Capillary: 114 mg/dL — ABNORMAL HIGH (ref 70–99)
Glucose-Capillary: 155 mg/dL — ABNORMAL HIGH (ref 70–99)
Glucose-Capillary: 168 mg/dL — ABNORMAL HIGH (ref 70–99)

## 2018-12-03 LAB — STREP PNEUMONIAE URINARY ANTIGEN: Strep Pneumo Urinary Antigen: POSITIVE — AB

## 2018-12-03 MED ORDER — LISINOPRIL 20 MG PO TABS
20.0000 mg | ORAL_TABLET | Freq: Every day | ORAL | Status: DC
  Administered 2018-12-04: 20 mg via ORAL
  Filled 2018-12-03: qty 1

## 2018-12-03 MED ORDER — AZITHROMYCIN 250 MG PO TABS: 250.0000 mg | ORAL_TABLET | Freq: Every day | ORAL | Status: DC

## 2018-12-03 MED ORDER — LISINOPRIL-HYDROCHLOROTHIAZIDE 20-12.5 MG PO TABS: 1.0000 | ORAL_TABLET | Freq: Every day | ORAL | Status: DC

## 2018-12-03 MED ORDER — AZITHROMYCIN 250 MG PO TABS
250.0000 mg | ORAL_TABLET | Freq: Every day | ORAL | Status: DC
  Administered 2018-12-04: 250 mg via ORAL
  Filled 2018-12-03: qty 1

## 2018-12-03 MED ORDER — HYDROCHLOROTHIAZIDE 12.5 MG PO CAPS
12.5000 mg | ORAL_CAPSULE | Freq: Every day | ORAL | Status: DC
  Administered 2018-12-04: 12.5 mg via ORAL
  Filled 2018-12-03: qty 1

## 2018-12-03 NOTE — Progress Notes (Signed)
[place] [facility] [place] NAME: Candice Cook    MR#:  [id number]  DATE OF BIRTH:  [birth year]  SUBJECTIVE:    REVIEW OF SYSTEMS:   ROS Tolerating Diet: Tolerating PT:   DRUG ALLERGIES:   Allergies  Allergen Reactions  . Mirabegron Other (See Comments)    Severe tremor, headache  . Metformin And Related Diarrhea    VITALS:  Blood pressure (!) 128/47, pulse 70, temperature 98.4 F (36.9 C), temperature source Oral, resp. rate 18, height 5' (1.524 m), weight 54.3 kg, SpO2 96 %.  PHYSICAL EXAMINATION:   Physical Exam  GENERAL:  77 y.o.-year-old patient lying in the bed with no acute distress.  EYES: Pupils equal, round, reactive to light and accommodation. No scleral icterus. Extraocular muscles intact.  HEENT: Head atraumatic, normocephalic. Oropharynx and nasopharynx clear.  NECK:  Supple, no jugular venous distention. No thyroid enlargement, no tenderness.  LUNGS: Normal breath sounds bilaterally, no wheezing, rales, rhonchi. No use of accessory muscles of respiration.  CARDIOVASCULAR: S1, S2 normal. No murmurs, rubs, or gallops.  ABDOMEN: Soft, nontender, nondistended. Bowel sounds present. No organomegaly or mass.  EXTREMITIES: No cyanosis, clubbing or edema b/l.    NEUROLOGIC: Cranial nerves II through XII are intact. No focal Motor or sensory deficits b/l.   PSYCHIATRIC:  patient is alert and oriented x 3.  SKIN: No obvious rash, lesion, or ulcer.   LABORATORY PANEL:  CBC Recent Labs  Lab 12/03/18 0540  WBC 4.3  HGB 12.6  HCT 38.9  PLT 117*    Chemistries  Recent Labs  Lab 12/02/18 1007 12/03/18 0540  NA 140 140  K 3.8 3.5  CL 102 102  CO2 28 28  GLUCOSE 135* 124*  BUN 14 13  CREATININE 0.97 0.86  CALCIUM 8.5* 8.4*  AST 25  --   ALT 18  --   ALKPHOS 53  --   BILITOT 0.9  --    Cardiac Enzymes No results for input(s): TROPONINI in the last 168 hours. RADIOLOGY:  Dg Chest 2 View  Result Date:  12/02/2018 CLINICAL DATA:  Shortness of breath, cough 3 weeks EXAM: CHEST - 2 VIEW COMPARISON:  02/18/2013 FINDINGS: There is hazy right lower lobe airspace disease concerning for pneumonia. There is no pleural effusion or pneumothorax. The heart and mediastinal contours are unremarkable. The osseous structures are unremarkable. IMPRESSION: Hazy right lower lobe airspace disease concerning for pneumonia. Followup PA and lateral chest X-ray is recommended in 3-4 weeks following trial of antibiotic therapy to ensure resolution and exclude underlying malignancy. Electronically Signed   By: Kathreen Devoid   On: 12/02/2018 08:53   ASSESSMENT AND PLAN:  Candice Cook  is a 77 y.o. female with a known history of hypertension, hyperlipidemia, type 2 diabetes, anxiety, depression who was sent to the ED from urgent care due to hypoxia to 86% on room air.  She has been having productive cough and shortness of breath over the last 3 weeks her cough is productive of clear sputum.  Her shortness of breath and cough has been getting progressively worse  *Acute hypoxic respiratory failure-secondary to community-acquired pneumonia.  Requiring 2 L of oxygen by [place]. -Duonebs prn -Wean O2 as able -Ambulate with pulse ox prior to discharge--assess for home oxygen use  *Sepsis secondary to community-acquired pneumonia- patient meeting sepsis criteria due to tachypneic and tachycardic in the ED.  Chest x-ray with right lower lobe infiltrate. Lactic acid normal. -Continue ceftriaxone and  azithromycin -Follow-up blood cultures negatvie - procalcitonin 0.10 - urinary strep pneumo positive and legionella antigens pending -Sputum cx GPC/GNR  *Hypertension- BPs soft in the ED -resumed lisinopril-HCTZ for now  *Type 2 diabetes-stable.  Recent A1c 6.5. 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 Management plans discussed with the patient, family and they are in agreement.  CODE STATUS: DNR  DVT Prophylaxis: lovenox  TOTAL TIME TAKING CARE OF THIS PATIENT: *25* minutes.  >50% time spent on counselling and coordination of care  POSSIBLE D/C IN *1-2* DAYS, DEPENDING ON CLINICAL CONDITION.  Note: This dictation was prepared with Dragon dictation along with smaller phrase technology. Any transcriptional errors that result from this process are unintentional.  Fritzi Mandes M.D on 12/03/2018 at 3:57 PM  Between 7am to 6pm - Pager - [phone]  After 6pm go to [email or web address] - password [email or web address] [facility]  Office  [phone]  CC: Primary care physician; Ezequiel Kayser, MDPatient ID: Candice Cook, female   DOB: [birth year], 77 y.o.   MRN: [id number]

## 2018-12-03 NOTE — Evaluation (Signed)
Physical Therapy Evaluation Patient Details Name: Candice Cook MRN: [id number] DOB: 1941/04/26 Today's Date: 12/03/2018   History of Present Illness  77 yo female with onset of sepsis from acute PNA and suspected lung CA was admitted with hypoxic respiratory failure.  PMHx:  skin CA, ankle fracture, gout, HTN, DM,   Clinical Impression  Pt was seen for evaluation of mobility and progression of balance skills but assessing with and without O2.  Her tolerance for even sitting was too low with O2 so progressed her to using tank air and used RW.  Her plan is to advance with mobility as tolerated and will have family support to get home when medically ready.  Follow acutely for same, with observation of sats with and without O2 to see if her tolerance has changed.    Follow Up Recommendations Home health PT;Supervision for mobility/OOB    Equipment Recommendations  None recommended by PT(has a RW)    Recommendations for Other Services       Precautions / Restrictions Precautions Precautions: Fall Restrictions Weight Bearing Restrictions: No  O2 sats with all mobility, using 4L O2     Mobility  Bed Mobility Overal bed mobility: Needs Assistance Bed Mobility: Supine to Sit;Sit to Supine     Supine to sit: Min guard Sit to supine: Min guard   General bed mobility comments: able to be assisted to side of bed, family assisting her  Transfers Overall transfer level: Needs assistance Equipment used: 1 person hand held assist;Rolling walker (2 wheeled) Transfers: Sit to/from Stand Sit to Stand: Min assist         General transfer comment: cues for hand placement  Ambulation/Gait Ambulation/Gait assistance: Min guard Gait Distance (Feet): 120 Feet Assistive device: Rolling walker (2 wheeled);1 person hand held assist Gait Pattern/deviations: Step-through pattern;Decreased stride length;Wide base of support Gait velocity: reduced Gait velocity interpretation: <1.31 ft/sec,  indicative of household ambulator General Gait Details: Testing O2 sats to determine need for O2  Stairs            Wheelchair Mobility    Modified Rankin (Stroke Patients Only)       Balance                                             Pertinent Vitals/Pain Pain Assessment: No/denies pain    Home Living Family/patient expects to be discharged to:: Private residence Living Arrangements: Spouse/significant other Available Help at Discharge: Family;Available 24 hours/day Type of Home: House Home Access: Ramped entrance     Home Layout: One level Home Equipment: Walker - 2 wheels;Cane - single point      Prior Function Level of Independence: Independent with assistive device(s)         Comments: was previously able to walk with assistance     Hand Dominance   Dominant Hand: Right    Extremity/Trunk Assessment   Upper Extremity Assessment Upper Extremity Assessment: Overall WFL for tasks assessed    Lower Extremity Assessment Lower Extremity Assessment: Overall WFL for tasks assessed    Cervical / Trunk Assessment Cervical / Trunk Assessment: Kyphotic  Communication   Communication: No difficulties  Cognition Arousal/Alertness: Awake/alert Behavior During Therapy: WFL for tasks assessed/performed Overall Cognitive Status: Within Functional Limits for tasks assessed  General Comments General comments (skin integrity, edema, etc.): O2 sat side of bed with no O2 was 87% but with O2 could walk with 95% sat    Exercises     Assessment/Plan    PT Assessment Patient needs continued PT services  PT Problem List Decreased strength;Decreased range of motion;Decreased activity tolerance;Decreased balance;Decreased mobility;Decreased coordination;Decreased knowledge of use of DME;Decreased safety awareness;Cardiopulmonary status limiting activity       PT Treatment Interventions  DME instruction;Gait training;Functional mobility training;Therapeutic activities;Therapeutic exercise;Balance training;Neuromuscular re-education;Patient/family education    PT Goals (Current goals can be found in the Care Plan section)  Acute Rehab PT Goals Patient Stated Goal: to walk and get stronger PT Goal Formulation: With patient/family Time For Goal Achievement: 12/17/18 Potential to Achieve Goals: Good    Frequency Min 2X/week   Barriers to discharge   home with husband to live in accessible home    Co-evaluation               AM-PAC PT "6 Clicks" Mobility  Outcome Measure Help needed turning from your back to your side while in a flat bed without using bedrails?: A Little Help needed moving from lying on your back to sitting on the side of a flat bed without using bedrails?: A Little Help needed moving to and from a bed to a chair (including a wheelchair)?: A Little Help needed standing up from a chair using your arms (e.g., wheelchair or bedside chair)?: A Little Help needed to walk in hospital room?: A Little Help needed climbing 3-5 steps with a railing? : Total 6 Click Score: 16    End of Session Equipment Utilized During Treatment: Gait belt;Oxygen Activity Tolerance: Treatment limited secondary to medical complications (Comment) Patient left: in bed;with call bell/phone within reach;with bed alarm set;with family/visitor present Nurse Communication: Mobility status PT Visit Diagnosis: Other abnormalities of gait and mobility (R26.89);Muscle weakness (generalized) (M62.81);Adult, failure to thrive (R62.7)    Time: 2633-3545 PT Time Calculation (min) (ACUTE ONLY): 33 min   Charges:   PT Evaluation $PT Eval Moderate Complexity: 1 Mod PT Treatments $Gait Training: 8-22 mins       Ramond Dial 12/03/2018, 4:41 PM   Mee Hives, PT MS Acute Rehab Dept. Number: [place]

## 2018-12-03 NOTE — Plan of Care (Signed)
The patient has been seen by Physical therapy. No falls. Voiding. IV antibiotics provided. PRN cough medication provided. Pt could not be wean off oxygen at this point.  Problem: Education: Goal: Knowledge of General Education information will improve Description Including pain rating scale, medication(s)/side effects and non-pharmacologic comfort measures Outcome: Progressing   Problem: Health Behavior/Discharge Planning: Goal: Ability to manage health-related needs will improve Outcome: Progressing   Problem: Clinical Measurements: Goal: Ability to maintain clinical measurements within normal limits will improve Outcome: Progressing Goal: Will remain free from infection Outcome: Progressing Goal: Diagnostic test results will improve Outcome: Progressing Goal: Respiratory complications will improve Outcome: Progressing Goal: Cardiovascular complication will be avoided Outcome: Progressing   Problem: Activity: Goal: Risk for activity intolerance will decrease Outcome: Progressing   Problem: Nutrition: Goal: Adequate nutrition will be maintained Outcome: Progressing   Problem: Coping: Goal: Level of anxiety will decrease Outcome: Progressing   Problem: Elimination: Goal: Will not experience complications related to bowel motility Outcome: Progressing Goal: Will not experience complications related to urinary retention Outcome: Progressing   Problem: Pain Managment: Goal: General experience of comfort will improve Outcome: Progressing   Problem: Safety: Goal: Ability to remain free from injury will improve Outcome: Progressing   Problem: Skin Integrity: Goal: Risk for impaired skin integrity will decrease Outcome: Progressing

## 2018-12-04 LAB — GLUCOSE, CAPILLARY
Glucose-Capillary: 117 mg/dL — ABNORMAL HIGH (ref 70–99)
Glucose-Capillary: 132 mg/dL — ABNORMAL HIGH (ref 70–99)

## 2018-12-04 LAB — LEGIONELLA PNEUMOPHILA SEROGP 1 UR AG: L. pneumophila Serogp 1 Ur Ag: NEGATIVE

## 2018-12-04 LAB — PROCALCITONIN: Procalcitonin: <0.1 ng/mL

## 2018-12-04 MED ORDER — PHENOL 1.4 % MT LIQD
1.0000 | OROMUCOSAL | Status: DC | PRN
  Filled 2018-12-04: qty 177

## 2018-12-04 MED ORDER — AZITHROMYCIN 250 MG PO TABS: ORAL_TABLET | ORAL | 0 refills | Status: DC

## 2018-12-04 MED ORDER — GUAIFENESIN-CODEINE 100-10 MG/5ML PO SOLN: 10.0000 mL | ORAL | 0 refills | Status: DC | PRN

## 2018-12-04 MED ORDER — CEFDINIR 300 MG PO CAPS
300.0000 mg | ORAL_CAPSULE | Freq: Two times a day (BID) | ORAL | Status: DC
  Administered 2018-12-04: 300 mg via ORAL
  Filled 2018-12-04 (×2): qty 1

## 2018-12-04 MED ORDER — SODIUM CHLORIDE 0.9 % IV SOLN
INTRAVENOUS | Status: DC | PRN
  Administered 2018-12-04: 250 mL via INTRAVENOUS

## 2018-12-04 MED ORDER — CEFDINIR 300 MG PO CAPS: 300.0000 mg | ORAL_CAPSULE | Freq: Two times a day (BID) | ORAL | 0 refills | Status: DC

## 2018-12-04 NOTE — Progress Notes (Signed)
Physical Therapy Treatment Patient Details Name: Candice Cook MRN: [id number] DOB: 12/18/1941 Today's Date: 12/04/2018    History of Present Illness 77 yo female with onset of sepsis from acute PNA and suspected lung CA was admitted with hypoxic respiratory failure.  PMHx:  skin CA, ankle fracture, gout, HTN, DM,     PT Comments    Patient agrees to ambulate again, reports she ambulated earlier with nursing. Continues to have supplemental O2. Received on .5 lpm in bed. Sats at 86%. Began ambulation on 1 lpm, sats continued to be at 86%. Increased O2 to 2 lpm, sats then rose to 93%. Reported findings to RN. Patient's room o2 increased to 1 lpm. Patient demonstrates good endurance and safety with ambulation, limited by respiratory status and saturation levels.      Follow Up Recommendations  Home health PT     Equipment Recommendations  None recommended by PT    Recommendations for Other Services       Precautions / Restrictions Precautions Precautions: Fall Restrictions Weight Bearing Restrictions: No    Mobility  Bed Mobility Overal bed mobility: Independent Bed Mobility: Supine to Sit;Sit to Supine     Supine to sit: Independent Sit to supine: Independent      Transfers Overall transfer level: Independent Equipment used: (patient pushed O2 tank) Transfers: Sit to/from Stand Sit to Stand: Supervision            Ambulation/Gait Ambulation/Gait assistance: Min guard Gait Distance (Feet): 120 Feet Assistive device: (patient pushed O2 tank) Gait Pattern/deviations: WFL(Within Functional Limits) Gait velocity: decreased       Stairs             Wheelchair Mobility    Modified Rankin (Stroke Patients Only)       Balance Overall balance assessment: Mild deficits observed, not formally tested                                          Cognition Arousal/Alertness: Awake/alert Behavior During Therapy: WFL for tasks  assessed/performed Overall Cognitive Status: Within Functional Limits for tasks assessed                                        Exercises      General Comments        Pertinent Vitals/Pain Pain Assessment: No/denies pain    Home Living Family/patient expects to be discharged to:: Private residence Living Arrangements: Spouse/significant other Available Help at Discharge: Family;Available 24 hours/day Type of Home: House Home Access: Ramped entrance   Home Layout: One level Home Equipment: Walker - 2 wheels;Cane - single point      Prior Function Level of Independence: Independent      Comments: ambulated without AD   PT Goals (current goals can now be found in the care plan section) Acute Rehab PT Goals Patient Stated Goal: to get off the Oxygen PT Goal Formulation: With patient Time For Goal Achievement: 12/18/18 Potential to Achieve Goals: Fair    Frequency    Min 2X/week      PT Plan      Co-evaluation              AM-PAC PT "6 Clicks" Mobility   Outcome Measure  Help needed turning from your back to your side while  in a flat bed without using bedrails?: None Help needed moving from lying on your back to sitting on the side of a flat bed without using bedrails?: None Help needed moving to and from a bed to a chair (including a wheelchair)?: A Little Help needed standing up from a chair using your arms (e.g., wheelchair or bedside chair)?: A Little Help needed to walk in hospital room?: A Little Help needed climbing 3-5 steps with a railing? : A Little 6 Click Score: 20    End of Session Equipment Utilized During Treatment: Gait belt Activity Tolerance: Patient tolerated treatment well Patient left: in bed;with family/visitor present Nurse Communication: Mobility status;Other (comment)(O2 sats with ambulation) PT Visit Diagnosis: Muscle weakness (generalized) (M62.81);Difficulty in walking, not elsewhere classified (R26.2)      Time: 1140-1155 PT Time Calculation (min) (ACUTE ONLY): 15 min  Charges:  $Gait Training: 8-22 mins                     Kinzlee Selvy, PT, GCS 12/04/18,12:25 PM

## 2018-12-04 NOTE — Discharge Summary (Signed)
[facility] at [place] NAME: Candice Cook    MR#:  [id number]  DATE OF BIRTH:  Feb 27, 1941  DATE OF ADMISSION:  12/02/2018 ADMITTING PHYSICIAN: Sela Hua, MD  DATE OF DISCHARGE: 12/04/2018  PRIMARY CARE PHYSICIAN: Ezequiel Kayser, MD    ADMISSION DIAGNOSIS:  Hypoxia [R09.02] Community acquired pneumonia of right lower lobe of lung ([place]) [J18.1] Acute respiratory failure with hypoxia ([place]) [J96.01]  DISCHARGE DIAGNOSIS:  acute hypoxia secondary to pneumonia now on oxygen community acquired pneumonia  SECONDARY DIAGNOSIS:   Past Medical History:  Diagnosis Date  . Anxiety and depression   . Arthritis   . Broken ankle   . Cancer ([place])    skin ca  . Depression   . Diabetes mellitus without complication ([place])   . GERD (gastroesophageal reflux disease)   . Gout   . Gout   . Heart murmur   . Hypercholesteremia   . Hypertension   . Mixed incontinence   . Pneumonia    Right lower lobe pneumonia  . Thyroid condition     HOSPITAL COURSE:  LindaFulleris a77 y.o.femalewith a known history of hypertension, hyperlipidemia, type 2 diabetes, anxiety, depression who was sent to the ED from urgent care due to hypoxia to 86% on room air. She has been having productive cough and shortness of breath over the last 3 weeks her cough is productive of clear sputum. Her shortness of breath and cough has been getting progressively worse  *Acute hypoxic respiratory failure-secondary to community-acquired pneumonia. Requiring 2 L of oxygen by [place]--pt desated to 83% on Ambulation-- patient will be set up for home oxygen. Likely need will be temporary. She has no formal diagnosis of COPD or asthma or any underlying lung disorder. She is a former smoker. -Duonebs prn -Ambulate with pulse ox prior to discharge-- patient qualifies for home oxygen use. She will pay for her oxygen. Differ primary care physician to wean her off oxygen at home.  Home health has been arranged.  *Sepsis secondary to community-acquired pneumonia-patientmeeting sepsis criteria due totachypneic and tachycardic in the ED.  -Chest x-ray with right lower lobe infiltrate. Lactic acid normal. -Continue ceftriaxone and azithromycin--change to po abxs -Follow-up blood cultures negatvie - procalcitonin 0.10 - urinary strep pneumo positive  -Sputum cx GPC/GNR  *Hypertension-BPs soft in the ED -resumed lisinopril-HCTZ for now  *Type 2 diabetes-stable.Recent A1c 6.5. 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 She is hemodynamically stable. Discharge to home. Patient very much agreeable. Husband in the room agrees with plan.  CONSULTS OBTAINED:    DRUG ALLERGIES:   Allergies  Allergen Reactions  . Mirabegron Other (See Comments)    Severe tremor, headache  . Metformin And Related Diarrhea    DISCHARGE MEDICATIONS:   Allergies as of 12/04/2018      Reactions   Mirabegron Other (See Comments)   Severe tremor, headache   Metformin And Related Diarrhea      Medication List    TAKE these medications   allopurinol 100 MG tablet Commonly known as:  ZYLOPRIM Take 200 mg daily by mouth.   aspirin EC 81 MG tablet Take 81 mg by mouth daily.   azithromycin 250 MG tablet Commonly known as:  ZITHROMAX Take daily as directed Start taking on:  12/05/2018  cefdinir 300 MG capsule Commonly known as:  OMNICEF Take 1 capsule (300 mg total) by mouth every 12 (twelve) hours.   conjugated estrogens vaginal cream Commonly known as:  PREMARIN Place 8.46 Applicatorfuls vaginally 2 (two) times a week.   Cyanocobalamin 2500 MCG Subl Place under the tongue.   donepezil 10 MG tablet Commonly known  as:  ARICEPT Take 10 mg by mouth at bedtime.   gabapentin 300 MG capsule Commonly known as:  NEURONTIN Take 900 mg by mouth at bedtime.   glimepiride 2 MG tablet Commonly known as:  AMARYL Take 2 mg by mouth daily with breakfast.   guaiFENesin-codeine 100-10 MG/5ML syrup Take 10 mLs by mouth every 4 (four) hours as needed for cough.   indomethacin 25 MG capsule Commonly known as:  INDOCIN Take 25 mg by mouth daily. Reported on 05/17/2016   LIBERTY GLUCOSE CONTROL Normal Liqd Use as directed.   lisinopril-hydrochlorothiazide 20-12.5 MG tablet Commonly known as:  PRINZIDE,ZESTORETIC TAKE 1 TABLET BY MOUTH EVERY MORNING. FOR BLOOD PRESSURE   loperamide 2 MG capsule Commonly known as:  IMODIUM Take 2 mg by mouth as needed.   omeprazole 40 MG capsule Commonly known as:  PRILOSEC Take 40 mg by mouth daily.   ONE TOUCH ULTRA TEST test strip Generic drug:  glucose blood USE ONCE DAILY. AS INSTRUCTED.   onetouch ultrasoft lancets USE 1 EACH ONCE DAILY. USE AS INSTRUCTED. ONE TOUCH LANCETS   sertraline 50 MG tablet Commonly known as:  ZOLOFT Take 50 mg by mouth daily.   simvastatin 20 MG tablet Commonly known as:  ZOCOR Take 20 mg daily by mouth.   traZODone 50 MG tablet Commonly known as:  DESYREL Take 100 mg by mouth at bedtime.   TRIMO-SAN 0.025-0.01 % Gel Generic drug:  Oxyquinoline-Sod Lauryl Sulf Place vaginally.   VITAMIN D-1000 MAX ST 25 MCG (1000 UT) tablet Generic drug:  Cholecalciferol Take 2,000 Units daily by mouth.            Durable Medical Equipment  (From admission, onward)         Start     Ordered   12/04/18 1332  DME Oxygen  Once    Question Answer Comment  Mode or (Route) Nasal cannula   Liters per Minute 2   Frequency Continuous (stationary and portable oxygen unit needed)   Oxygen conserving device Yes   Oxygen delivery system Gas      12/04/18 1332          If you experience worsening of your admission symptoms, develop  shortness of breath, life threatening emergency, suicidal or homicidal thoughts you must seek medical attention immediately by calling 911 or calling your MD immediately  if symptoms less severe.  You Must read complete instructions/literature along with all the possible adverse reactions/side effects for all the Medicines you take and that have been prescribed to you. Take any new Medicines after you have completely understood and accept all the possible adverse reactions/side effects.   Please note  You were cared for by a hospitalist during your hospital stay. If you have any questions about your discharge medications or the care you received while you were in the hospital after you are discharged, you can call the unit and asked to speak with the hospitalist on call if the hospitalist that took care of you is not available. Once you are discharged, your primary care physician will handle any further medical issues. Please note that NO REFILLS for any discharge  medications will be authorized once you are discharged, as it is imperative that you return to your primary care physician (or establish a relationship with a primary care physician if you do not have one) for your aftercare needs so that they can reassess your need for medications and monitor your lab values. Today   SUBJECTIVE  no new complaints. Cough with expectoration. No respiratory distress. VITAL SIGNS:  Blood pressure (!) 131/50, pulse 66, temperature 99 F (37.2 C), temperature source Oral, resp. rate 17, height 5' (1.524 m), weight 54.3 kg, SpO2 (!) 86 %.  I/O:    Intake/Output Summary (Last 24 hours) at 12/04/2018 1333 Last data filed at 12/04/2018 0537 Gross per 24 hour  Intake 0 ml  Output 0 ml  Net 0 ml    PHYSICAL EXAMINATION:  GENERAL:  77 y.o.-year-old patient lying in the bed with no acute distress.  EYES: Pupils equal, round, reactive to light and accommodation. No scleral icterus. Extraocular muscles intact.   HEENT: Head atraumatic, normocephalic. Oropharynx and nasopharynx clear.  NECK:  Supple, no jugular venous distention. No thyroid enlargement, no tenderness.  LUNGS: Normal breath sounds bilaterally, no wheezing, rales,rhonchi or crepitation. No use of accessory muscles of respiration.  CARDIOVASCULAR: S1, S2 normal. No murmurs, rubs, or gallops.  ABDOMEN: Soft, non-tender, non-distended. Bowel sounds present. No organomegaly or mass.  EXTREMITIES: No pedal edema, cyanosis, or clubbing.  NEUROLOGIC: Cranial nerves II through XII are intact. Muscle strength 5/5 in all extremities. Sensation intact. Gait not checked.  PSYCHIATRIC: The patient is alert and oriented x 3.  SKIN: No obvious rash, lesion, or ulcer.   DATA REVIEW:   CBC  Recent Labs  Lab 12/03/18 0540  WBC 4.3  HGB 12.6  HCT 38.9  PLT 117*    Chemistries  Recent Labs  Lab 12/02/18 1007 12/03/18 0540  NA 140 140  K 3.8 3.5  CL 102 102  CO2 28 28  GLUCOSE 135* 124*  BUN 14 13  CREATININE 0.97 0.86  CALCIUM 8.5* 8.4*  AST 25  --   ALT 18  --   ALKPHOS 53  --   BILITOT 0.9  --     Microbiology Results   Recent Results (from the past 240 hour(s))  Blood Culture (routine x 2)     Status: None (Preliminary result)   Collection Time: 12/02/18 10:07 AM  Result Value Ref Range Status   Specimen Description BLOOD R AC  Final   Special Requests   Final    BOTTLES DRAWN AEROBIC AND ANAEROBIC Blood Culture results may not be optimal due to an excessive volume of blood received in culture bottles   Culture   Final    NO GROWTH 2 DAYS Performed at [facility], [address], [place], [place] [address]    Report Status PENDING  Incomplete  Blood Culture (routine x 2)     Status: None (Preliminary result)   Collection Time: 12/02/18 10:10 AM  Result Value Ref Range Status   Specimen Description BLOOD LACF  Final   Special Requests   Final    BOTTLES DRAWN AEROBIC AND ANAEROBIC Blood Culture results may  not be optimal due to an excessive volume of blood received in culture bottles   Culture   Final    NO GROWTH 2 DAYS Performed at [facility], [address], [place], [place] [address]    Report Status PENDING  Incomplete  C difficile quick scan w PCR reflex  Status: None   Collection Time: 12/02/18  5:19 PM  Result Value Ref Range Status   C Diff antigen NEGATIVE NEGATIVE Final   C Diff toxin NEGATIVE NEGATIVE Final   C Diff interpretation No C. difficile detected.  Final    Comment: Performed at [facility], [address] [place], [place] [address]  Expectorated sputum assessment w rflx to resp cult     Status: None   Collection Time: 12/02/18  5:52 PM  Result Value Ref Range Status   Specimen Description SPUTUM  Final   Special Requests Normal  Final   Sputum evaluation   Final    THIS SPECIMEN IS ACCEPTABLE FOR SPUTUM CULTURE Performed at [facility], [address], [place], [place] [address]    Report Status 12/02/2018 FINAL  Final  Culture, respiratory     Status: None (Preliminary result)   Collection Time: 12/02/18  5:52 PM  Result Value Ref Range Status   Specimen Description   Final    SPUTUM Performed at [facility], [address] [place], [place] [address]    Special Requests   Final    Normal Reflexed from [id number] Performed at [facility], [address] [place], [place] [address]    Gram Stain   Final    FEW WBC PRESENT, PREDOMINANTLY PMN FEW GRAM NEGATIVE RODS FEW GRAM POSITIVE RODS FEW GRAM POSITIVE COCCI    Culture   Final    FEW Consistent with normal respiratory flora. Performed at [facility], [place] [address] [place], [place] [address]    Report Status PENDING  Incomplete    RADIOLOGY:  No results found.   Management plans discussed with the patient, family and they are in agreement.  CODE STATUS:     Code Status Orders  (From admission, onward)         Start      Ordered   12/02/18 1146  Do not attempt resuscitation (DNR)  Continuous    Question Answer Comment  In the event of cardiac or respiratory ARREST Do not call a "code blue"   In the event of cardiac or respiratory ARREST Do not perform Intubation, CPR, defibrillation or ACLS   In the event of cardiac or respiratory ARREST Use medication by any route, position, wound care, and other measures to relive pain and suffering. May use oxygen, suction and manual treatment of airway obstruction as needed for comfort.      12/02/18 1146        Code Status History    This patient has a current code status but no historical code status.      TOTAL TIME TAKING CARE OF THIS PATIENT: *40* minutes.    Fritzi Mandes M.D on 12/04/2018 at 1:33 PM  Between 7am to 6pm - Pager - [phone] After 6pm go to [email or web address] - password [email or web address] [facility]  Office  [phone]  CC: Primary care physician; Ezequiel Kayser, MD

## 2018-12-04 NOTE — Care Management Note (Signed)
Case Management Note  Patient Details  Name: KARL KNARR MRN: [id number] Date of Birth: 01-29-1941   Patient admitted from home with PNA.  Patient lives at home with her husband.  PCP Theis.  Patient states that at at baseline she is independent.  PT has assessed patient and recommended home health PT.  Patient agreeable to services.  Patient states that she has a RW and BSC in the home.  CMS Medicare.gov Compare Post Acute Care list reviewed with patient and spouse.  Patient states that she does not have a preference of agency.  Referral made [facility] with [facility], as [facility] is not accepting [facility] at this time.    Patient with qualifying sats for home O2, however she does not have a qualifying diagnosis.  Patient and spouse is aware that O2 will be an out of pocket expense.  Portable O2 to be delivered to room by Corene Cornea with [facility] prior to discharge.   Subjective/Objective:                    Action/Plan:   Expected Discharge Date:  12/04/18               Expected Discharge Plan:  [place]  In-House Referral:     Discharge planning Services  CM Consult  Post Acute Care Choice:  Home Health, Durable Medical Equipment Choice offered to:  Patient  DME Arranged:  Oxygen DME Agency:  [facility] Arranged:  RN, PT [facility] Agency:  [facility]  Status of Service:  Completed, signed off  If discussed at [place] of Stay Meetings, dates discussed:    Additional Comments:  Beverly Sessions, RN 12/04/2018, 2:19 PM

## 2018-12-04 NOTE — Progress Notes (Signed)
[facility] at [place] NAME: Candice Cook    MR#:  [id number]  DATE OF BIRTH:  January 29, 1941  SUBJECTIVE:  patient feels a lot better. Cough with productive phlegm. No chest pain. No wheezing. REVIEW OF SYSTEMS:   Review of Systems  Constitutional: Negative for chills, fever and weight loss.  HENT: Negative for ear discharge, ear pain and nosebleeds.   Eyes: Negative for blurred vision, pain and discharge.  Respiratory: Positive for cough and sputum production. Negative for shortness of breath, wheezing and stridor.   Cardiovascular: Negative for chest pain, palpitations, orthopnea and PND.  Gastrointestinal: Negative for abdominal pain, diarrhea, nausea and vomiting.  Genitourinary: Negative for frequency and urgency.  Musculoskeletal: Negative for back pain and joint pain.  Neurological: Negative for sensory change, speech change, focal weakness and weakness.  Psychiatric/Behavioral: Negative for depression and hallucinations. The patient is not nervous/anxious.    Tolerating Diet:yes Tolerating PT: HHPT  DRUG ALLERGIES:   Allergies  Allergen Reactions  . Mirabegron Other (See Comments)    Severe tremor, headache  . Metformin And Related Diarrhea    VITALS:  Blood pressure (!) 131/50, pulse 66, temperature 99 F (37.2 C), temperature source Oral, resp. rate 17, height 5' (1.524 m), weight 54.3 kg, SpO2 90 %.  PHYSICAL EXAMINATION:   Physical Exam  GENERAL:  77 y.o.-year-old patient lying in the bed with no acute distress.  EYES: Pupils equal, round, reactive to light and accommodation. No scleral icterus. Extraocular muscles intact.  HEENT: Head atraumatic, normocephalic. Oropharynx and nasopharynx clear.  NECK:  Supple, no jugular venous distention. No thyroid enlargement, no tenderness.  LUNGS: Normal breath sounds bilaterally, no wheezing, few LL rales, no rhonchi. No use of accessory muscles of respiration.   CARDIOVASCULAR: S1, S2 normal. No murmurs, rubs, or gallops.  ABDOMEN: Soft, nontender, nondistended. Bowel sounds present. No organomegaly or mass.  EXTREMITIES: No cyanosis, clubbing or edema b/l.    NEUROLOGIC: Cranial nerves II through XII are intact. No focal Motor or sensory deficits b/l.   PSYCHIATRIC:  patient is alert and oriented x 3.  SKIN: No obvious rash, lesion, or ulcer.   LABORATORY PANEL:  CBC Recent Labs  Lab 12/03/18 0540  WBC 4.3  HGB 12.6  HCT 38.9  PLT 117*    Chemistries  Recent Labs  Lab 12/02/18 1007 12/03/18 0540  NA 140 140  K 3.8 3.5  CL 102 102  CO2 28 28  GLUCOSE 135* 124*  BUN 14 13  CREATININE 0.97 0.86  CALCIUM 8.5* 8.4*  AST 25  --   ALT 18  --   ALKPHOS 53  --   BILITOT 0.9  --    Cardiac Enzymes No results for input(s): TROPONINI in the last 168 hours. RADIOLOGY:  No results found. ASSESSMENT AND PLAN:  Candice Cook  is a 77 y.o. female with a known history of hypertension, hyperlipidemia, type 2 diabetes, anxiety, depression who was sent to the ED from urgent care due to hypoxia to 86% on room air.  She has been having productive cough and shortness of breath over the last 3 weeks her cough is productive of clear sputum.  Her shortness of breath and cough has been getting progressively worse  *Acute hypoxic respiratory failure-secondary to community-acquired pneumonia.  Requiring 2 L of oxygen by [place]--pt desated to 83% on Ambulation -remote smoker -Duonebs prn -Wean O2 as able -Ambulate with pulse ox prior to discharge--assess for home oxygen use  *  Sepsis secondary to community-acquired pneumonia- patient meeting sepsis criteria due to tachypneic and tachycardic in the ED.  Chest x-ray with right lower lobe infiltrate. Lactic acid normal. -Continue ceftriaxone and azithromycin--change to po abxs -Follow-up blood cultures negatvie - procalcitonin 0.10 - urinary strep pneumo positive  -Sputum cx GPC/GNR  *Hypertension- BPs  soft in the ED -resumed lisinopril-HCTZ for now  *Type 2 diabetes-stable.  Recent A1c 6.5. 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 Management plans discussed with the patient, family and they are in agreement.  CODE STATUS: DNR  DVT Prophylaxis: lovenox  TOTAL TIME TAKING CARE OF THIS PATIENT: *25* minutes.  >50% time spent on counselling and coordination of care  POSSIBLE D/C IN *1-2* DAYS, DEPENDING ON CLINICAL CONDITION.  Note: This dictation was prepared with Dragon dictation along with smaller phrase technology. Any transcriptional errors that result from this process are unintentional.  Fritzi Mandes M.D on 12/04/2018 at 11:46 AM  Between 7am to 6pm - Pager - [phone]  After 6pm go to [email or web address] - password [email or web address] [facility]  Office  [phone]  CC: Primary care physician; Ezequiel Kayser, MDPatient ID: Candice Cook, female   DOB: 1941/04/03, 77 y.o.   MRN: [id number]

## 2018-12-04 NOTE — Progress Notes (Signed)
SATURATION QUALIFICATIONS: (This note is used to comply with regulatory documentation for home oxygen)  Patient Saturations on Room Air at Rest = 93%  Patient Saturations on Room Air while Ambulating = 83%  Patient Saturations on 2 Liters of oxygen while Ambulating = 93%

## 2018-12-05 LAB — CULTURE, RESPIRATORY W GRAM STAIN
Culture: NORMAL
Special Requests: NORMAL

## 2018-12-07 LAB — CULTURE, BLOOD (ROUTINE X 2)
Culture: NO GROWTH
Culture: NO GROWTH

## 2018-12-13 ENCOUNTER — Encounter: Payer: Self-pay | Admitting: Obstetrics and Gynecology

## 2018-12-13 ENCOUNTER — Ambulatory Visit (INDEPENDENT_AMBULATORY_CARE_PROVIDER_SITE_OTHER): Payer: Medicare HMO | Admitting: Obstetrics and Gynecology

## 2018-12-13 VITALS — BP 124/69 | HR 53 | Ht 60.0 in | Wt 122.7 lb

## 2018-12-13 DIAGNOSIS — Z4689 Encounter for fitting and adjustment of other specified devices: Secondary | ICD-10-CM | POA: Diagnosis not present

## 2018-12-13 DIAGNOSIS — N8111 Cystocele, midline: Secondary | ICD-10-CM

## 2018-12-13 DIAGNOSIS — N3946 Mixed incontinence: Secondary | ICD-10-CM

## 2018-12-13 DIAGNOSIS — N952 Postmenopausal atrophic vaginitis: Secondary | ICD-10-CM | POA: Diagnosis not present

## 2018-12-13 DIAGNOSIS — B373 Candidiasis of vulva and vagina: Secondary | ICD-10-CM

## 2018-12-13 DIAGNOSIS — B3731 Acute candidiasis of vulva and vagina: Secondary | ICD-10-CM | POA: Insufficient documentation

## 2018-12-13 MED ORDER — NYSTATIN-TRIAMCINOLONE 100000-0.1 UNIT/GM-% EX CREA: 1.0000 "application " | TOPICAL_CREAM | Freq: Two times a day (BID) | CUTANEOUS | 0 refills | Status: DC

## 2018-12-13 NOTE — Progress Notes (Signed)
Chief complaint: 1.  Pessary maintenance 2.  Midline cystocele 3.  Mixed incontinence 4.  Vaginal atrophy   Patient presents for pessary maintenance (last visit 09/13/2018) Pessary type: #2 incontinence dish pessary with notch Medications: MSM gel intravaginal weekly; Premarin cream intravaginal weekly Symptom review: Patient reports no vaginal bleeding, vaginal spotting, pelvic pressure, or vaginal odor.  She does feel that her bladder is more completely emptying during voiding episodes and her urge symptoms are not as troublesome.  Bowel function is reportedly normal. However, the patient has had some health concerns recently including hospitalization for pneumonia, and a need for cardiology evaluation which is coming up in the near future.  She has developed significant vulvar irritation with burning and itching since her hospitalization and antibiotic treatment. At this time she does not desire to leave the pessary in.  She does not like a change and is not interested in follow-up with other gynecologist at this time.  Past Medical History:  Diagnosis Date  . Anxiety and depression   . Arthritis   . Broken ankle   . Cancer ([place])    skin ca  . Depression   . Diabetes mellitus without complication ([place])   . GERD (gastroesophageal reflux disease)   . Gout   . Gout   . Heart murmur   . Hypercholesteremia   . Hypertension   . Mixed incontinence   . Pneumonia    Right lower lobe pneumonia  . Thyroid condition    Past Surgical History:  Procedure Laterality Date  . ABDOMINAL HYSTERECTOMY    . APPENDECTOMY  1993  . CATARACT EXTRACTION W/PHACO Left 03/22/2017   Procedure: CATARACT EXTRACTION PHACO AND INTRAOCULAR LENS PLACEMENT ([place]) left diabetic;  Surgeon: Eulogio Bear, MD;  Location: [facility];  Service: Ophthalmology;  Laterality: Left;  Diabetic  . CATARACT EXTRACTION W/PHACO Right 05/17/2017   Procedure: CATARACT EXTRACTION PHACO AND INTRAOCULAR LENS PLACEMENT  ([place]) Right diabetic;  Surgeon: Eulogio Bear, MD;  Location: [place];  Service: Ophthalmology;  Laterality: Right;  . CHOLECYSTECTOMY    . COLONOSCOPY WITH PROPOFOL N/A 11/14/2017   Procedure: COLONOSCOPY WITH PROPOFOL;  Surgeon: Lollie Sails, MD;  Location[facility] ENDOSCOPY;  Service: Endoscopy;  Laterality: N/A;  . EYE SURGERY     cataracts with extraocular prosthesis  . KIDNEY SURGERY     gave daughter a kidney  . 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 Negative for chills and fever.  Respiratory: Positive for shortness of breath.   Gastrointestinal: Negative.   Genitourinary: Positive for urgency.  Musculoskeletal: Negative.   Skin: Positive for itching and rash.       Vulvar irritation since antibiotic use in the hospital  All other systems reviewed and are negative.    OBJECTIVE: BP 124/69   Pulse (!) 53   Ht 5' (1.524 m)   Wt 122 lb 11.2 oz (55.7 kg)   BMI 23.96 kg/m  Pleasant female no distress.  Alert and oriented.  Affect is appropriate. Back: No CVA tenderness Abdomen: Soft, nontender, no organomegaly Bladder: Nontender Pelvic exam: External genitalia-moderate hyperemia with satellite lesions without ulceration BUS-normal Vagina-mild to moderate atrophy; second-degree cystocele present; moderate rectocele is present Cervix-surgically absent Uterus-surgically absent Bimanual-not performed today Rectovaginal-normal external exam  PROCEDURE: #2 incontinence dish pessary is removed, cleaned, and not reinserted  ASSESSMENT: 1.  Second-degree cystocele, asymptomatic with pessary use. 2.  Improved voiding with pessary in  place, without significant residual retention. 3.  Mixed incontinence, with decreased urge symptoms while pessary is in place 4.  Monilia vulvitis secondary to antibiotic use from recent hospitalization for pneumonia 5.  Patient does not desire  continued use of pessary at this time  PLAN: 1.  The pessary was removed and not reinserted. 2.  Nystatin/triamcinolone cream topically to the vulva twice a day for 14 days 3.  [facility] future management of pessary if desired.  A total of 15 minutes were spent face-to-face with the patient during this encounter and over half of that time dealt with counseling and coordination of care.  Brayton Mars, MD  Note: This dictation was prepared with Dragon dictation along with smaller phrase technology. Any transcriptional errors that result from this process are unintentional.

## 2018-12-13 NOTE — Patient Instructions (Signed)
1.  The pessary is removed today.  It is not reinserted.  Patient desires to leave the pessary out. 2.  Mycolog cream is to be applied topically twice a day for the next 14 days to treat vulvitis 3.  Return as needed in the future if patient desires pessary to be reinserted.

## 2019-01-18 ENCOUNTER — Ambulatory Visit: Payer: Medicare HMO | Admitting: Urology

## 2019-01-22 ENCOUNTER — Ambulatory Visit: Payer: Medicare HMO | Admitting: Urology

## 2019-02-14 ENCOUNTER — Encounter: Payer: Self-pay | Admitting: *Deleted

## 2019-02-14 ENCOUNTER — Other Ambulatory Visit: Payer: Self-pay

## 2019-02-21 NOTE — Discharge Instructions (Signed)
INSTRUCTIONS FOLLOWING OCULOPLASTIC SURGERY °AMY M. FOWLER, MD ° °AFTER YOUR EYE SURGERY, THER ARE MANY THINGS THWIHC YOU, THE PATIENT, CAN DO TO ASSURE THE BEST POSSIBLE RESULT FROM YOUR OPERATION.  THIS SHEET SHOULD BE REFERRED TO WHENEVER QUESTIONS ARISE.  IF THERE ARE ANY QUESTIONS NOT ANSWERED HERE, DO NOT HESITATE TO CALL OUR OFFICE AT [phone] OR [phone].  THERE IS ALWAYS [facility] AVAILABLE TO CALL IF QUESTIONS OR PROBLEMS ARISE. ° °VISION: Your vision may be blurred and out of focus after surgery until you are able to stop using your ointment, swelling resolves and your eye(s) heal. This may take 1 to 2 weeks at the least.  If your vision becomes gradually more dim or dark, this is not normal and you need to call our office immediately. ° °EYE CARE: For the first 48 hours after surgery, use ice packs frequently - “20 minutes on, 20 minutes off” - to help reduce swelling and bruising.  Small bags of frozen peas or corn make good ice packs along with cloths soaked in ice water.  If you are wearing a patch or other type of dressing following surgery, keep this on for the amount of time specified by your doctor.  For the first week following surgery, you will need to treat your stitches with great care.  If is OK to shower, but take care to not allow soapy water to run into your eye(s) to help reduce changes of infection.  You may gently clean the eyelashes and around the eye(s) with cotton balls and sterile water, BUT DO NOT RUB THE STITCHES VIGOROUSLY.  Keeping your stitches moist with ointment will help promote healing with minimal scar formation. ° °ACTIVITY: When you leave the surgery center, you should go home, rest and be inactive.  The eye(s) may feel scratchy and keeping the eyes closed will allow for faster healing.  The first week following surgery, avoid straining (anything making the face turn red) or lifting over 20 pounds.  Additionally, avoid bending which causes your head to go below  your waist.  Using your eyes will NOT harm them, so feel free to read, watch television, use the computer, etc as desired.  Driving depends on each individual, so check with your doctor if you have questions about driving. ° °MEDICATIONS:  You will be given a prescription for an ointment to use 4 times a day on your stitches.  You can use the ointment in your eyes if they feel scratchy or irritated.  If you eyelid(s) don’t close completely when you sleep, put some ointment in your eyes before bedtime. ° °EMERGENCY: If you experience SEVERE EYE PAIN OR HEADACHE UNRELIEVED BY TYLENOL OR PERCOCET, NAUSEA OR VOMITING, WORSENING REDNESS, OR WORSENING VISION (ESPECIALLY VISION THAT WA INITIALLY BETTER) CALL [phone] OR [phone] DURING BUSINESS HOURS OR AFTER HOURS. ° °General Anesthesia, Adult, Care After °This sheet gives you information about how to care for yourself after your procedure. Your health care provider may also give you more specific instructions. If you have problems or questions, contact your health care provider. °What can I expect after the procedure? °After the procedure, the following side effects are common: °· Pain or discomfort at the IV site. °· Nausea. °· Vomiting. °· Sore throat. °· Trouble concentrating. °· Feeling cold or chills. °· Weak or tired. °· Sleepiness and fatigue. °· Soreness and body aches. These side effects can affect parts of the body that were not involved in surgery. °Follow these instructions at home: ° °  For at least 24 hours after the procedure: °· Have a responsible adult stay with you. It is important to have someone help care for you until you are awake and alert. °· Rest as needed. °· Do not: °? Participate in activities in which you could fall or become injured. °? Drive. °? Use heavy machinery. °? Drink alcohol. °? Take sleeping pills or medicines that cause drowsiness. °? Make important decisions or sign legal documents. °? Take care of children on your  own. °Eating and drinking °· Follow any instructions from your health care provider about eating or drinking restrictions. °· When you feel hungry, start by eating small amounts of foods that are soft and easy to digest (bland), such as toast. Gradually return to your regular diet. °· Drink enough fluid to keep your urine pale yellow. °· If you vomit, rehydrate by drinking water, juice, or clear broth. °General instructions °· If you have sleep apnea, surgery and certain medicines can increase your risk for breathing problems. Follow instructions from your health care provider about wearing your sleep device: °? Anytime you are sleeping, including during daytime naps. °? While taking prescription pain medicines, sleeping medicines, or medicines that make you drowsy. °· Return to your normal activities as told by your health care provider. Ask your health care provider what activities are safe for you. °· Take over-the-counter and prescription medicines only as told by your health care provider. °· If you smoke, do not smoke without supervision. °· Keep all follow-up visits as told by your health care provider. This is important. °Contact a health care provider if: °· You have nausea or vomiting that does not get better with medicine. °· You cannot eat or drink without vomiting. °· You have pain that does not get better with medicine. °· You are unable to pass urine. °· You develop a skin rash. °· You have a fever. °· You have redness around your IV site that gets worse. °Get help right away if: °· You have difficulty breathing. °· You have chest pain. °· You have blood in your urine or stool, or you vomit blood. °Summary °· After the procedure, it is common to have a sore throat or nausea. It is also common to feel tired. °· Have a responsible adult stay with you for the first 24 hours after general anesthesia. It is important to have someone help care for you until you are awake and alert. °· When you feel hungry,  start by eating small amounts of foods that are soft and easy to digest (bland), such as toast. Gradually return to your regular diet. °· Drink enough fluid to keep your urine pale yellow. °· Return to your normal activities as told by your health care provider. Ask your health care provider what activities are safe for you. °This information is not intended to replace advice given to you by your health care provider. Make sure you discuss any questions you have with your health care provider. °Document Released: 03/21/2001 Document Revised: 07/29/2017 Document Reviewed: 07/29/2017 °Elsevier Interactive Patient Education © 2019 Elsevier Inc. ° °

## 2019-02-26 ENCOUNTER — Ambulatory Visit (INDEPENDENT_AMBULATORY_CARE_PROVIDER_SITE_OTHER): Payer: Medicare HMO | Admitting: Licensed Clinical Social Worker

## 2019-02-26 ENCOUNTER — Encounter: Payer: Self-pay | Admitting: Licensed Clinical Social Worker

## 2019-02-26 DIAGNOSIS — F411 Generalized anxiety disorder: Secondary | ICD-10-CM

## 2019-02-26 NOTE — Progress Notes (Addendum)
Comprehensive Clinical Assessment (CCA) Note  02/27/2019 Candice Cook [id number]  Visit Diagnosis:      ICD-10-CM   1. Generalized anxiety disorder F41.1       CCA Part One  Part One has been completed on paper by the patient.  (See scanned document in Chart Review)  CCA Part Two A  Intake/Chief Complaint:  CCA Intake With Chief Complaint CCA Part Two Date: 02/26/19 CCA Part Two Time: 83 Chief Complaint/Presenting Problem: "I've been diagnosed with short term memory loss--so my doctor thought it would be good to have someone to talk to."  Patients Currently Reported Symptoms/Problems: "I started fidgeting with my fingers. I take anxiety medicine."  Collateral Involvement: None.  Individual's Strengths: "I don't know."  Individual's Preferences: N/A Individual's Abilities: Good communication  Type of Services Patient Feels Are Needed: individual therapy  Initial Clinical Notes/Concerns: N/A  Mental Health Symptoms Depression:  Depression: Change in energy/activity, Difficulty Concentrating, Fatigue, Hopelessness, Increase/decrease in appetite, Tearfulness  Mania:  Mania: N/A  Anxiety:   Anxiety: Difficulty concentrating, Fatigue, Restlessness, Tension, Worrying  Psychosis:  Psychosis: N/A  Trauma:  Trauma: N/A  Obsessions:  Obsessions: N/A  Compulsions:  Compulsions: N/A  Inattention:  Inattention: N/A  Hyperactivity/Impulsivity:  Hyperactivity/Impulsivity: N/A  Oppositional/Defiant Behaviors:  Oppositional/Defiant Behaviors: N/A  Borderline Personality:  Emotional Irregularity: N/A  Other Mood/Personality Symptoms:  Other Mood/Personality Symtpoms: N/A   Mental Status Exam Appearance and self-care  Stature:  Stature: Average  Weight:  Weight: Average weight  Clothing:  Clothing: Neat/clean  Grooming:  Grooming: Normal  Cosmetic use:  Cosmetic Use: Age appropriate  Posture/gait:  Posture/Gait: Normal  Motor activity:  Motor Activity: Not Remarkable  Sensorium   Attention:  Attention: Normal  Concentration:  Concentration: Normal  Orientation:  Orientation: X5  Recall/memory:  Recall/Memory: Defective in short-term  Affect and Mood  Affect:  Affect: Anxious  Mood:  Mood: Anxious  Relating  Eye contact:  Eye Contact: Normal  Facial expression:  Facial Expression: Anxious  Attitude toward examiner:  Attitude Toward Examiner: Cooperative  Thought and Language  Speech flow: Speech Flow: Normal  Thought content:  Thought Content: Appropriate to mood and circumstances  Preoccupation:  Preoccupations: (N/A)  Hallucinations:  Hallucinations: (N/A)  Organization:     Transport planner of Knowledge:  Fund of Knowledge: Average  Intelligence:  Intelligence: Average  Abstraction:  Abstraction: Normal  Judgement:  Judgement: Normal  Reality Testing:  Reality Testing: Realistic  Insight:  Insight: Good  Decision Making:  Decision Making: Normal  Social Functioning  Social Maturity:  Social Maturity: Responsible  Social Judgement:  Social Judgement: Normal  Stress  Stressors:  Stressors: Transitions  Coping Ability:  Coping Ability: Normal  Skill Deficits:     Supports:      Family and Psychosocial History: Family history Marital status: Married Number of Years Married: 7 What types of issues is patient dealing with in the relationship?: "I"ve let the house go because he doesn't clean up after himself."  Additional relationship information: Three previous marriages. First husband: "first true love. He just up and left after I got pregnant." Second marriage: "He slept a lot. I had to work harder. I got pregnant with him too. I left him." Third marriage: "He was a drunk. He died in the hospital."  Are you sexually active?: No What is your sexual orientation?: Heterosexual  Has your sexual activity been affected by drugs, alcohol, medication, or emotional stress?: N/A Does patient have children?: Yes How many  children?: 3 How is  patient's relationship with their children?: Three children, one passed away. One son and one daughter living: "My daughter and I have a good relationship. My son and I have a good relationship too."   Childhood History:  Childhood History By whom was/is the patient raised?: Both parents Additional childhood history information: "good!"  Description of patient's relationship with caregiver when they were a child: Mom: "It was really good." Dad: "Good."  Patient's description of current relationship with people who raised him/her: Deceased  How were you disciplined when you got in trouble as a child/adolescent?: "My mother didn't discipline [place]. My dad did the spankings."  Does patient have siblings?: Yes Number of Siblings: 2 Description of patient's current relationship with siblings: 2 brothers, one deceased. 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  Did [employer] Graduate From [employer]?: Yes Did Physicist, medical?:  No Did [place]?: No Did You Have Any Special Interests In School?: N/A Did You Have An Individualized Education Program (IIEP): No Did You Have Any Difficulty At School?: No  Religion: Religion/Spirituality Are You A Religious Person?: Yes What is Your Religious Affiliation?: Methodist How Might This Affect Treatment?: N/A  Leisure/Recreation: Leisure / Recreation Leisure and Hobbies: "I crochet. I like to get on my computer and play games."   Exercise/Diet: Exercise/Diet Do You Exercise?: No Have You Gained or Lost A Significant Amount of Weight in the Past Six Months?: No Do You Follow a Special Diet?: No Do You Have Any Trouble Sleeping?: No  CCA Part Two C  Alcohol/Drug Use: Alcohol / Drug Use Pain Medications: SEE MAR Prescriptions: SEE MAR Over the Counter: SEE MAR History of alcohol / drug use?: No history of alcohol / drug abuse                      CCA Part Three  ASAM's:  Six Dimensions of Multidimensional Assessment  Dimension 1:  Acute Intoxication and/or Withdrawal Potential:     Dimension 2:  Biomedical Conditions and Complications:     Dimension 3:  Emotional, Behavioral, or Cognitive Conditions and Complications:     Dimension 4:  Readiness to Change:     Dimension 5:  Relapse, Continued use, or Continued Problem Potential:     Dimension 6:  Recovery/Living Environment:      Substance use Disorder (SUD)    Social Function:  Social Functioning Social Maturity: Responsible Social Judgement: Normal  Stress:  Stress Stressors: Transitions Coping Ability: Normal Patient Takes Medications The Way The Doctor Instructed?: Yes  Priority Risk: Low Acuity  Risk Assessment- Self-Harm Potential: Risk Assessment For Self-Harm Potential Thoughts of Self-Harm: No current thoughts Method: No plan Availability of Means: No access/NA  Risk Assessment -Dangerous to Others Potential: Risk Assessment For Dangerous to Others  Potential Method: No Plan Availability of Means: No access or NA Intent: Vague intent or NA Notification Required: No need or identified person  DSM5 Diagnoses: Patient Active Problem List   Diagnosis Date Noted  . 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 Acute on chronic respiratory failure with hypoxia ([place]) 12/02/2018  . Acute respiratory failure with hypoxia ([place]) 12/02/2018  . Tick bite 06/20/2018  . Pessary maintenance 01/11/2018  . Cellulitis and abscess of right lower extremity 12/28/2017  . Vaginal polyp 12/12/2017  . Vaginal atrophy 12/12/2017  . Cystocele, midline 12/12/2017  . Mixed incontinence 03/23/2016    Patient Centered Plan: Patient is on the following Treatment Plan(s):  Anxiety  Recommendations for Services/Supports/Treatments: Recommendations for Services/Supports/Treatments Recommendations For Services/Supports/Treatments: Individual Therapy, Medication Management  Treatment Plan Summary:  Candice Cook was able to speak openly about her anxiety symptoms and how they are affecting her daily life. We will utilize CBT moving forward to assist in managing anxiety symptoms.   Referrals to Alternative Service(s): Referred to Alternative Service(s):   Place:   Date:   Time:    Referred to Alternative Service(s):   Place:   Date:   Time:    Referred to Alternative Service(s):   Place:   Date:   Time:    Referred to Alternative Service(s):   Place:   Date:   Time:     Alden Hipp, LCSW

## 2019-02-27 ENCOUNTER — Ambulatory Visit: Payer: Medicare HMO | Admitting: Anesthesiology

## 2019-02-27 ENCOUNTER — Encounter
Admission: RE | Disposition: A | Discharge status: Home / Self Care | Payer: Self-pay | Source: Home / Self Care | Attending: Ophthalmology

## 2019-02-27 ENCOUNTER — Ambulatory Visit
Admission: RE | Admit: 2019-02-27 | Discharge: 2019-02-27 | Disposition: A | Discharge status: Home / Self Care | Payer: Medicare HMO | Attending: Ophthalmology | Admitting: Ophthalmology

## 2019-02-27 DIAGNOSIS — Z87891 Personal history of nicotine dependence: Secondary | ICD-10-CM | POA: Insufficient documentation

## 2019-02-27 DIAGNOSIS — I10 Essential (primary) hypertension: Secondary | ICD-10-CM | POA: Insufficient documentation

## 2019-02-27 DIAGNOSIS — H02834 Dermatochalasis of left upper eyelid: Secondary | ICD-10-CM | POA: Diagnosis present

## 2019-02-27 DIAGNOSIS — H02831 Dermatochalasis of right upper eyelid: Secondary | ICD-10-CM | POA: Insufficient documentation

## 2019-02-27 DIAGNOSIS — Z888 Allergy status to other drugs, medicaments and biological substances status: Secondary | ICD-10-CM | POA: Diagnosis not present

## 2019-02-27 DIAGNOSIS — Z85828 Personal history of other malignant neoplasm of skin: Secondary | ICD-10-CM | POA: Insufficient documentation

## 2019-02-27 DIAGNOSIS — H57813 Brow ptosis, bilateral: Secondary | ICD-10-CM | POA: Insufficient documentation

## 2019-02-27 DIAGNOSIS — K219 Gastro-esophageal reflux disease without esophagitis: Secondary | ICD-10-CM | POA: Diagnosis not present

## 2019-02-27 DIAGNOSIS — E119 Type 2 diabetes mellitus without complications: Secondary | ICD-10-CM | POA: Insufficient documentation

## 2019-02-27 HISTORY — DX: Presence of dental prosthetic device (complete) (partial): Z97.2

## 2019-02-27 HISTORY — PX: BROW LIFT: SHX178

## 2019-02-27 HISTORY — PX: BROW PTOSIS: SHX6727

## 2019-02-27 LAB — GLUCOSE, CAPILLARY
GLUCOSE-CAPILLARY: 81 mg/dL (ref 70–99)
Glucose-Capillary: 96 mg/dL (ref 70–99)

## 2019-02-27 SURGERY — BLEPHAROPLASTY
Anesthesia: Monitor Anesthesia Care | Site: Eye | Laterality: Bilateral

## 2019-02-27 MED ORDER — OXYCODONE HCL 5 MG/5ML PO SOLN: 5.0000 mg | Freq: Once | ORAL | Status: DC | PRN

## 2019-02-27 MED ORDER — OXYCODONE HCL 5 MG PO TABS: 5.0000 mg | ORAL_TABLET | Freq: Once | ORAL | Status: DC | PRN

## 2019-02-27 MED ORDER — LACTATED RINGERS IV SOLN
INTRAVENOUS | Status: DC
  Administered 2019-02-27: 09:00:00 via INTRAVENOUS

## 2019-02-27 MED ORDER — TETRACAINE HCL 0.5 % OP SOLN
OPHTHALMIC | Status: DC | PRN
  Administered 2019-02-27: 1 [drp] via OPHTHALMIC

## 2019-02-27 MED ORDER — LIDOCAINE-EPINEPHRINE 2 %-1:100000 IJ SOLN
INTRAMUSCULAR | Status: DC | PRN
  Administered 2019-02-27: .5 mL via OPHTHALMIC
  Administered 2019-02-27: 6.5 mL via OPHTHALMIC

## 2019-02-27 MED ORDER — PROPOFOL 500 MG/50ML IV EMUL
INTRAVENOUS | Status: DC | PRN
  Administered 2019-02-27: 25 ug/kg/min via INTRAVENOUS

## 2019-02-27 MED ORDER — LIDOCAINE HCL (CARDIAC) PF 100 MG/5ML IV SOSY
PREFILLED_SYRINGE | INTRAVENOUS | Status: DC | PRN
  Administered 2019-02-27: 30 mg via INTRAVENOUS

## 2019-02-27 MED ORDER — ERYTHROMYCIN 5 MG/GM OP OINT: TOPICAL_OINTMENT | OPHTHALMIC | 2 refills | Status: DC

## 2019-02-27 MED ORDER — ALFENTANIL 500 MCG/ML IJ INJ
INJECTION | INTRAVENOUS | Status: DC | PRN
  Administered 2019-02-27 (×2): 250 ug via INTRAVENOUS

## 2019-02-27 MED ORDER — BSS IO SOLN
INTRAOCULAR | Status: DC | PRN
  Administered 2019-02-27: 15 mL

## 2019-02-27 MED ORDER — ONDANSETRON HCL 4 MG/2ML IJ SOLN
INTRAMUSCULAR | Status: DC | PRN
  Administered 2019-02-27: 4 mg via INTRAVENOUS

## 2019-02-27 MED ORDER — ERYTHROMYCIN 5 MG/GM OP OINT
TOPICAL_OINTMENT | OPHTHALMIC | Status: DC | PRN
  Administered 2019-02-27: 1 via OPHTHALMIC

## 2019-02-27 MED ORDER — TRAMADOL HCL 50 MG PO TABS: ORAL_TABLET | ORAL | 0 refills | Status: DC

## 2019-02-27 SURGICAL SUPPLY — 25 items
APPLICATOR COTTON TIP WD 3 STR (MISCELLANEOUS) ×3 IMPLANT
BLADE SURG 15 STRL LF DISP TIS (BLADE) ×1 IMPLANT
BLADE SURG 15 STRL SS (BLADE) ×2
CORD BIP STRL DISP 12FT (MISCELLANEOUS) ×3 IMPLANT
DRAPE HEAD BAR (DRAPES) ×3 IMPLANT
GAUZE SPONGE 4X4 12PLY STRL (GAUZE/BANDAGES/DRESSINGS) ×3 IMPLANT
GAUZE SPONGE NON-WVN 2X2 STRL (MISCELLANEOUS) ×10 IMPLANT
GLOVE SURG LX 7.0 MICRO (GLOVE) ×4
GLOVE SURG LX STRL 7.0 MICRO (GLOVE) ×2 IMPLANT
MARKER SKIN XFINE TIP W/RULER (MISCELLANEOUS) ×3 IMPLANT
NDL FILTER BLUNT 18X1 1/2 (NEEDLE) ×1 IMPLANT
NDL HYPO 30X.5 LL (NEEDLE) ×2 IMPLANT
NEEDLE FILTER BLUNT 18X 1/2SAF (NEEDLE) ×2
NEEDLE FILTER BLUNT 18X1 1/2 (NEEDLE) ×1 IMPLANT
NEEDLE HYPO 30X.5 LL (NEEDLE) ×6 IMPLANT
PACK ENT CUSTOM (PACKS) ×3 IMPLANT
SOL PREP PVP 2OZ (MISCELLANEOUS) ×3
SOLUTION PREP PVP 2OZ (MISCELLANEOUS) ×1 IMPLANT
SPONGE VERSALON 2X2 STRL (MISCELLANEOUS) ×20
SUT CHROMIC 5 0 P 3 (SUTURE) ×4 IMPLANT
SUT PLAIN GUT (SUTURE) ×3 IMPLANT
SUT PROLENE 5 0 PS 3 (SUTURE) ×4 IMPLANT
SYR 10ML LL (SYRINGE) ×3 IMPLANT
SYR 3ML LL SCALE MARK (SYRINGE) ×3 IMPLANT
WATER STERILE IRR 250ML POUR (IV SOLUTION) ×3 IMPLANT

## 2019-02-27 NOTE — Anesthesia Preprocedure Evaluation (Signed)
Anesthesia Evaluation  Patient identified by MRN, date of birth, ID band  Reviewed: NPO status   History of Anesthesia Complications Negative for: history of anesthetic complications  Airway Mallampati: II  TM Distance: >3 FB Neck ROM: full    Dental no notable dental hx. (+) Upper Dentures, Lower Dentures   Pulmonary pneumonia (resolved 11/2018), former smoker,    Pulmonary exam normal        Cardiovascular Exercise Tolerance: Good hypertension, Normal cardiovascular exam+ Valvular Problems/Murmurs (benign)      Neuro/Psych Anxiety Depression Short term memory loss  negative neurological ROS     GI/Hepatic Neg liver ROS, GERD  Controlled,  Endo/Other  diabetes  Renal/GU negative Renal ROS  negative genitourinary   Musculoskeletal  (+) Arthritis , gout   Abdominal   Peds  Hematology negative hematology ROS (+)   Anesthesia Other Findings echo: 12/2018: NORMAL LEFT VENTRICULAR SYSTOLIC FUNCTION NORMAL RIGHT VENTRICULAR SYSTOLIC FUNCTION MILD VALVULAR REGURGITATION (See above) NO VALVULAR STENOSIS Closest EF: >55% (Estimated) Mitral: TRIVIAL MR Tricuspid: MILD TR;  cards stable: 12/2018: dr Ubaldo Glassing. Holter monitor revealed no A. fib or no sustained arrhythmia. She did have PVCs and PACs with sinus arrhythmia. Echo revealed normal LV function with no significant valvular abnormalities.    Reproductive/Obstetrics                             Anesthesia Physical Anesthesia Plan  ASA: II  Anesthesia Plan: MAC   Post-op Pain Management:    Induction:   PONV Risk Score and Plan:   Airway Management Planned:   Additional Equipment:   Intra-op Plan:   Post-operative Plan:   Informed Consent: I have reviewed the patients History and Physical, chart, labs and discussed the procedure including the risks, benefits and alternatives for the proposed anesthesia with the patient or  authorized representative who has indicated his/her understanding and acceptance.       Plan Discussed with: CRNA  Anesthesia Plan Comments:         Anesthesia Quick Evaluation

## 2019-02-27 NOTE — Anesthesia Procedure Notes (Signed)
Procedure Name: MAC Date/Time: 02/27/2019 10:55 AM Performed by: Jeannene Patella, CRNA Pre-anesthesia Checklist: Patient identified, Emergency Drugs available, Suction available, Patient being monitored and Timeout performed Patient Re-evaluated:Patient Re-evaluated prior to induction Oxygen Delivery Method: Nasal cannula

## 2019-02-27 NOTE — H&P (Signed)
See the history and physical completed at [facility] on 02/12/2019 and scanned into the chart.

## 2019-02-27 NOTE — Op Note (Signed)
Preoperative Diagnosis:   1.  Visually significant bilateral brow ptosis.  2.  Visually significant dermatochalasis bilateral upper Eyelid(s)  Postoperative Diagnosis: Same.   Procedure(s) Performed:   1.  Bilateral direct brow lift to improve vision.  2.  Upper eyelid blepharoplasty with excess skin excision bilateral upper Eyelid(s)  Teaching Surgeon: Philis Pique. Vickki Muff, M.D.   Assistants: None   Anesthesia: MAC  Specimens: None.  Estimated Blood Loss: Minimal.  Complications: None.  Operative Findings: None Dictated  PROCEDURE:   Allergies were reviewed and the patient is allergic to mirabegron and metformin and related..   After the risks, benefits, complications and alternatives were discussed with the patient, appropriate informed consent was obtained. While seated in an upright position and looking in primary gaze, the amount of supra-brow skin to be removed was measured and marked in an elliptical pattern. The patient was then brought to the operating suite and reclined supine.  Timeout was conducted and the patient was sedated. Local anesthetic consisting of a 50-50 mixture of 2% lidocaine with epinephrine and 0.75% bupivacaine with added Hylenex was injected subcutaneously to the bilateral brow region(s) and down to the periosteum and  subcutaneously to both upper eyelid(s). After adequate local was instilled, the patient was prepped and draped in the usual sterile fashion for eyelid surgery.   Attention was turned to the right brow region. A #15 blade was used to create a bevelled incision along the premarked incision line. A skin and subcutaneous tissue flap was then excised and hemostasis was obtained with bipolar cautery. The deep tissues were reapproximated with interrupted vertical 5-0 chromic sutures. The skin margin was reapproximated with a running locking 5-0 Prolene suture. Attention was then turned to the opposite brow region where the same procedure was performed in  the same manner.    Attention was then turned to the upper eyelids. A 43m upper eyelid crease incision line was marked with calipers on both upper eyelid(s).  A pinch test was used to estimate the amount of excess skin to remove and this was marked in standard blepharoplasty style fashion. Attention was turned to the right upper eyelid. A #15 blade was used to open the premarked incision line. A skin only flap was excised and hemostasis was obtained with bipolar cautery.   Attention was then turned to the opposite eyelid where the same procedure was performed in the same manner.   The skin incisions were closed with a combination of interrupted and  running 6-0 fast absorbing plain gut suture.   The patient tolerated the procedure well.  Erythromycin ophthalmic ointment was applied to the incision site(s) followed by ice packs.The patient was taken to the recovery area where she recovered without difficulty.  Post-Op Plan/Instructions:  The patient was instructed to use ice packs frequently for the next 48 hours.  she was instructed to use with Romycin ophthalmic ointment on the eyelid incisions and over-the-counter antibiotic ointment on the brow sutures 4 times a day for the next 12 to 14 days. she was given a prescription for Percocet for pain control should Tylenol not be effective. she was asked to to follow up in 10 days time for suture removal or sooner as needed for problems.   Abhijay Morriss M. FVickki Muff M.D. Attending,Ophthalmology

## 2019-02-27 NOTE — Transfer of Care (Signed)
Immediate Anesthesia Transfer of Care Note  Patient: Candice Cook  Procedure(s) Performed: BLEPHAROPLASTY UPPER EYELID W/EXCESS SKIN (Bilateral Eye) BROW PTOSIS REPAIR (Bilateral Eye)  Patient Location: PACU  Anesthesia Type: MAC  Level of Consciousness: awake, alert  and patient cooperative  Airway and Oxygen Therapy: Patient Spontanous Breathing and Patient connected to supplemental oxygen  Post-op Assessment: Post-op Vital signs reviewed, Patient's Cardiovascular Status Stable, Respiratory Function Stable, Patent Airway and No signs of Nausea or vomiting  Post-op Vital Signs: Reviewed and stable  Complications: No apparent anesthesia complications

## 2019-02-27 NOTE — Anesthesia Postprocedure Evaluation (Signed)
Anesthesia Post Note  Patient: Candice Cook  Procedure(s) Performed: BLEPHAROPLASTY UPPER EYELID W/EXCESS SKIN (Bilateral Eye) BROW PTOSIS REPAIR (Bilateral Eye)  Patient location during evaluation: PACU Anesthesia Type: MAC Level of consciousness: awake and alert Pain management: pain level controlled Vital Signs Assessment: post-procedure vital signs reviewed and stable Respiratory status: spontaneous breathing, nonlabored ventilation, respiratory function stable and patient connected to nasal cannula oxygen Cardiovascular status: stable and blood pressure returned to baseline Postop Assessment: no apparent nausea or vomiting Anesthetic complications: no    Karmah Potocki

## 2019-02-27 NOTE — Interval H&P Note (Signed)
History and Physical Interval Note:  02/27/2019 10:14 AM  Candice Cook  has presented today for surgery, with the diagnosis of H02.831 DERMATOCHAISIS RIGHT UPPER LID H02.834 DERMATOCHAISIS LEFT UPPER LID  L57.4 BROW PTOSIS  The various methods of treatment have been discussed with the patient and family. After consideration of risks, benefits and other options for treatment, the patient has consented to  Procedure(s) with comments: BLEPHAROPLASTY UPPER EYELID W/EXCESS SKIN (Bilateral) BROW PTOSIS REPAIR (Bilateral) - Diabetic - oral meds as a surgical intervention .  The patient's history has been reviewed, patient examined, no change in status, stable for surgery.  I have reviewed the patient's chart and labs.  Questions were answered to the patient's satisfaction.     Vickki Muff, Amy M

## 2019-02-28 ENCOUNTER — Encounter: Payer: Self-pay | Admitting: Ophthalmology

## 2019-04-13 ENCOUNTER — Encounter: Payer: Self-pay | Admitting: Emergency Medicine

## 2019-04-13 ENCOUNTER — Ambulatory Visit
Admission: EM | Admit: 2019-04-13 | Discharge: 2019-04-13 | Disposition: A | Discharge status: Home / Self Care | Payer: Medicare HMO | Attending: Family Medicine | Admitting: Family Medicine

## 2019-04-13 ENCOUNTER — Other Ambulatory Visit: Payer: Self-pay

## 2019-04-13 DIAGNOSIS — N3001 Acute cystitis with hematuria: Secondary | ICD-10-CM | POA: Diagnosis not present

## 2019-04-13 LAB — URINALYSIS, COMPLETE (UACMP) WITH MICROSCOPIC
Glucose, UA: NEGATIVE mg/dL
Ketones, ur: NEGATIVE mg/dL
Nitrite: POSITIVE — AB
Protein, ur: 30 mg/dL — AB
Specific Gravity, Urine: 1.02 (ref 1.005–1.030)
WBC, UA: >50 WBC/hpf (ref 0–5)
pH: 7.5 (ref 5.0–8.0)

## 2019-04-13 MED ORDER — CIPROFLOXACIN HCL 500 MG PO TABS: 500.0000 mg | ORAL_TABLET | Freq: Two times a day (BID) | ORAL | 0 refills | Status: AC

## 2019-04-13 NOTE — Discharge Instructions (Addendum)
It was very nice meeting you today in clinic. Thank you for entrusting me with your care.   As discussed, your urine was POSITIVE for infection. I will prescribe you antibiotics and sent your sample for culture. If your culture comes back resistant to what I prescribed, someone will call you and advise you on the need to change antibiotics.  Please utilize the medications that we discussed. Your prescription has been called in to your pharmacy.  Increase your fluid (water) intake to flush out your urinary tract. Avoid caffeine to prevent painful bladder spasms.  May use over the counter Azo for pain if needed.   Make arrangements to follow up with your regular doctor for re-evaluation. If your symptoms/condition worsens, please seek follow up care either here or in the ER. Please remember, our [employer] providers are "right here with you" when you need [place].   Again, it was my pleasure to take care of you today. Thank you for choosing our clinic. I hope that you start to feel better quickly.   Honor Loh, MSN, APRN, FNP-C, CEN Advanced Practice Provider [facility]

## 2019-04-13 NOTE — ED Provider Notes (Addendum)
[address], [place], [place] [address] [phone]   Name: Candice Cook DOB: 1941/03/16 MRN: [id number] CSN: [id number]  Arrival date and time:  04/13/19 1012  Chief Complaint:  Urinary Urgency  NOTE: Prior to seeing the patient today, I have reviewed the triage nursing documentation and vital signs. Clinical staff has updated patient's PMH/PSHx, current medication list, and drug allergies/intolerances to ensure comprehensive history available to assist in medical decision making.   History:   HPI: Candice Cook is a 78 y.o. female who presents today with complaints of urinary symptoms that began approximately 4 days ago. Patient notes frequency, urgency, and bladder pressure. She has not mounted a fever response with her current symptom constellation. Patient denies abdominal pain. Urine is dark per her report, with no associated odor. PMH (+) for atrophic vaginitis and mixed urinary incontinence. 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   Patient advising that she sent a MyChart message to her provider at [facility] 2 days ago, however she never heard back from them.   Past Medical History:  Diagnosis Date  . Anxiety and depression   . Arthritis   . Broken ankle   . Cancer ([place])    skin ca  . Depression   . Diabetes mellitus without complication ([place])   . GERD (gastroesophageal reflux disease)   . Gout   . Gout   . Heart murmur   . Hypercholesteremia   . Hypertension   . Mixed incontinence   . Pneumonia    Right lower lobe pneumonia  . Thyroid condition   . Wears dentures    full upper and lower    Past Surgical History:  Procedure Laterality Date  . ABDOMINAL HYSTERECTOMY    . APPENDECTOMY  1993  . BROW LIFT Bilateral 02/27/2019   Procedure: BLEPHAROPLASTY UPPER EYELID W/EXCESS SKIN;  Surgeon: Karle Starch, MD;  Location: [place];  Service: Ophthalmology;  Laterality: Bilateral;  . BROW PTOSIS Bilateral  02/27/2019   Procedure: BROW PTOSIS REPAIR;  Surgeon: Karle Starch, MD;  Location: [place];  Service: Ophthalmology;  Laterality: Bilateral;  Diabetic - oral meds  . CATARACT EXTRACTION W/PHACO Left 03/22/2017   Procedure: CATARACT EXTRACTION PHACO AND INTRAOCULAR LENS PLACEMENT ([place]) left diabetic;  Surgeon: Eulogio Bear, MD;  Location: [place];  Service: Ophthalmology;  Laterality: Left;  Diabetic  . CATARACT EXTRACTION W/PHACO Right 05/17/2017   Procedure: CATARACT EXTRACTION PHACO AND INTRAOCULAR LENS PLACEMENT ([place]) Right diabetic;  Surgeon: Eulogio Bear, MD;  Location: [place];  Service: Ophthalmology;  Laterality: Right;  . CHOLECYSTECTOMY    . COLONOSCOPY WITH PROPOFOL N/A 11/14/2017   Procedure: COLONOSCOPY WITH PROPOFOL;  Surgeon: Lollie Sails, MD;  Location: [facility] ENDOSCOPY;  Service: Endoscopy;  Laterality: N/A;  . EYE SURGERY     cataracts with extraocular prosthesis  . KIDNEY SURGERY     gave daughter a kidney  . 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 Heart disease Mother   . Heart disease Father   . Diabetes Father   . Hypertension Father   . Cancer Brother   . Kidney disease Daughter   . Bladder Cancer Neg Hx   . Kidney cancer Neg Hx   . Breast cancer Neg Hx     Social History   Socioeconomic History  . Marital status: Married  Spouse name: Not on file  . Number of children: Not on file  . Years of education: Not on file  . Highest education level: Not on file  Occupational History  . Not on file  Social Needs  . Financial resource strain: Not on file  . Food insecurity:    Worry: Not on file    Inability: Not on file  . Transportation needs:    Medical: Not on file    Non-medical: Not on file  Tobacco Use  . Smoking status: Former Research scientist (life sciences)  . Smokeless tobacco: Never Used  . Tobacco comment: quit 25 years  Substance and Sexual Activity  .  Alcohol use: No    Alcohol/week: 0.0 standard drinks  . Drug use: No  . Sexual activity: Not Currently    Birth control/protection: Other-see comments  Lifestyle  . Physical activity:    Days per week: Not on file    Minutes per session: Not on file  . Stress: Not on file  Relationships  . Social connections:    Talks on phone: Not on file    Gets together: Not on file    Attends religious service: Not on file    Active member of club or organization: Not on file    Attends meetings of clubs or organizations: Not on file    Relationship status: Not on file  . Intimate partner violence:    Fear of current or ex partner: Not on file    Emotionally abused: Not on file    Physically abused: Not on file    Forced sexual activity: Not on file  Other Topics Concern  . Not on file  Social History Narrative  . Not on file    Patient Active Problem List   Diagnosis Date Noted  . 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 Acute on chronic respiratory failure with hypoxia ([place]) 12/02/2018  . Acute respiratory failure with hypoxia ([place]) 12/02/2018  . Tick bite 06/20/2018  . Pessary maintenance 01/11/2018  . Cellulitis and abscess of right lower extremity 12/28/2017  . Vaginal polyp 12/12/2017  . Vaginal atrophy 12/12/2017  . Cystocele, midline 12/12/2017  . Mixed incontinence 03/23/2016    Home Medications:    Current Meds  Medication Sig  . allopurinol (ZYLOPRIM) 100 MG tablet Take 200 mg daily by mouth.  Marland Kitchen aspirin EC 81 MG tablet Take 81 mg by mouth daily.  . Blood Glucose Calibration (LIBERTY GLUCOSE CONTROL) Normal LIQD Use as directed.  . conjugated estrogens (PREMARIN) vaginal cream Place 7.78 Applicatorfuls vaginally 2 (two) times a week.  . Cyanocobalamin 2500 MCG SUBL Place under the tongue.  . gabapentin (NEURONTIN) 300 MG capsule Take 900 mg by mouth at bedtime.   Marland Kitchen glimepiride (AMARYL) 2 MG tablet Take 2 mg by mouth daily with breakfast.  . indomethacin (INDOCIN) 25 MG  capsule Take 25 mg by mouth daily. Reported on 05/17/2016  . Lancets (ONETOUCH ULTRASOFT) lancets USE 1 EACH ONCE DAILY. USE AS INSTRUCTED. ONE TOUCH LANCETS  . lisinopril-hydrochlorothiazide (PRINZIDE,ZESTORETIC) 20-12.5 MG tablet TAKE 1 TABLET BY MOUTH EVERY MORNING. FOR BLOOD PRESSURE  . memantine (NAMENDA) 5 MG tablet Take 5 mg by mouth 2 (two) times daily.  Marland Kitchen omeprazole (PRILOSEC) 40 MG capsule Take 40 mg by mouth daily.  . sertraline (ZOLOFT) 50 MG tablet Take 50 mg by mouth daily.   . simvastatin (ZOCOR) 20 MG tablet Take 20 mg daily by mouth.  . traMADol (ULTRAM) 50 MG tablet Take 1 every 4-6 hours  as needed for pain not controlled by Tylenol  . traZODone (DESYREL) 50 MG tablet Take 100 mg by mouth at bedtime.   . [DISCONTINUED] Cholecalciferol (VITAMIN D-1000 MAX ST) 1000 units tablet Take 2,000 Units daily by mouth.     Allergies:   Mirabegron and Metformin and related  Review of Systems (ROS): Review of Systems  Constitutional: Negative for chills and fever.  Respiratory: Negative for cough and shortness of breath.   Cardiovascular: Negative for chest pain and palpitations.  Gastrointestinal: Negative for abdominal pain, nausea and vomiting.  Genitourinary: Positive for dysuria, frequency and urgency. Negative for flank pain and hematuria.  Skin: Negative for color change and rash.  Neurological: Negative for dizziness, weakness and light-headedness.  All other systems reviewed and are negative.    Physical Exam:  Triage Vital Signs ED Triage Vitals  Enc Vitals Group     BP 04/13/19 1027 121/67     Pulse Rate 04/13/19 1027 65     Resp 04/13/19 1027 18     Temp 04/13/19 1027 98.2 F (36.8 C)     Temp Source 04/13/19 1027 Oral     SpO2 04/13/19 1027 94 %     Weight 04/13/19 1022 125 lb (56.7 kg)     Height 04/13/19 1022 5' (1.524 m)     Head Circumference --      Peak Flow --      Pain Score 04/13/19 1022 0     Pain Loc --      Pain Edu? --      Excl. in [place]? --      Physical Exam  Constitutional: She is oriented to person, place, and time and well-developed, well-nourished, and in no distress.  HENT:  Head: Normocephalic and atraumatic.  Mouth/Throat: Oropharynx is clear and moist and mucous membranes are normal.  Neck: Neck supple.  Cardiovascular: Normal rate, regular rhythm, normal heart sounds and intact distal pulses. Exam reveals no gallop and no friction rub.  No murmur heard. Pulmonary/Chest: Effort normal and breath sounds normal. No respiratory distress. She has no wheezes. She has no rales.  Abdominal: Soft. Bowel sounds are normal. She exhibits no distension. There is no abdominal tenderness. There is no CVA tenderness.  Neurological: She is alert and oriented to person, place, and time.  Skin: Skin is warm and dry. No rash noted. No erythema.  Psychiatric: Mood, affect and judgment normal.  Nursing note and vitals reviewed.    Urgent Care Treatments / Results:   LABS: PLEASE NOTE: all labs that were ordered this encounter are listed, however only abnormal results are displayed. Labs Reviewed  URINALYSIS, COMPLETE (UACMP) WITH MICROSCOPIC - Abnormal; Notable for the following components:      Result Value   APPearance CLOUDY (*)    Hgb urine dipstick SMALL (*)    Bilirubin Urine SMALL (*)    Protein, ur 30 (*)    Nitrite POSITIVE (*)    Leukocytes,Ua MODERATE (*)    Bacteria, UA MANY (*)    All other components within normal limits  URINE CULTURE    RADIOLOGY: No results found.  PRODEDURES: Procedures  MEDICATIONS RECEIVED THIS VISIT: Medications - No data to display   Initial Impression / Assessment and Plan / Urgent Care Course:   Pertinent labs & imaging results that were available during my care of the patient were personally reviewed by me and considered in my medical decision making (see chart for details).   Candice Cook is a  78 y.o. female who presents to [facility] today with complaints of  urinary symptoms that began x 4 days ago. (+) pressure, frequency, and urgency. No abdominal pain or fevers. PMH (+) for atrophic vaginitis and mixed incontinence.   UA (+) for LE and nitrites. Will send for culture. Patient to be treated with a  5 day course of ciprofloxacin. She was encouraged to increase fluid (water) intake and avoid caffeine to prevent bladder spans. May use Azo if needed.   Discussed follow up with primary care physician this week for re-evaluation. I have reviewed the follow up and strict return precautions for any new or worsening symptoms. Patient is aware of symptoms that would be deemed urgent/emergent, and would thus require further evaluation either here or in the emergency department. At the time of discharge, she verbalized understanding and consent with the discharge plan as it was reviewed with her. All questions were fielded by provider and/or clinic staff prior to patient discharge.    Final Clinical Impressions(s) / Urgent Care Diagnoses:   Final diagnoses:  Acute cystitis with hematuria    New Prescriptions:   Meds ordered this encounter  Medications  . ciprofloxacin (CIPRO) 500 MG tablet    Sig: Take 1 tablet (500 mg total) by mouth 2 (two) times daily for 5 days.    Dispense:  10 tablet    Refill:  0    Controlled Substance Prescriptions:  [place] Controlled Substance Registry consulted? Not Applicable  NOTE: This note was prepared using Dragon dictation software along with smaller phrase technology. Despite my best ability to proofread, there is the potential that transcriptional errors may still occur from this process, and are completely unintentional.     Karen Kitchens, NP 04/13/19 1103    Karen Kitchens, NP 04/13/19 1223

## 2019-04-13 NOTE — ED Triage Notes (Signed)
Pt/ urinary urgency, pressure in the bladder area and urinary frequency. Started about 4 days ago.

## 2019-04-16 ENCOUNTER — Telehealth (HOSPITAL_COMMUNITY): Payer: Self-pay | Admitting: Emergency Medicine

## 2019-04-16 LAB — URINE CULTURE: Culture: >=100000 — AB

## 2019-04-16 NOTE — Telephone Encounter (Signed)
Urine culture was positive for KLEBSIELLA OXYTOCA and was given Cipro  at urgent care visit. Pt contacted and made aware, educated on completing antibiotic and to follow up if symptoms are persistent. Verbalized understanding.

## 2019-05-16 DIAGNOSIS — M810 Age-related osteoporosis without current pathological fracture: Secondary | ICD-10-CM | POA: Insufficient documentation

## 2019-08-13 ENCOUNTER — Other Ambulatory Visit: Payer: Self-pay | Admitting: Internal Medicine

## 2019-08-13 DIAGNOSIS — Z1231 Encounter for screening mammogram for malignant neoplasm of breast: Secondary | ICD-10-CM

## 2019-08-16 ENCOUNTER — Ambulatory Visit
Admission: RE | Admit: 2019-08-16 | Discharge: 2019-08-16 | Disposition: A | Discharge status: Home / Self Care | Payer: Medicare HMO | Source: Ambulatory Visit | Attending: Internal Medicine | Admitting: Internal Medicine

## 2019-08-16 ENCOUNTER — Other Ambulatory Visit: Payer: Self-pay

## 2019-08-16 DIAGNOSIS — Z1231 Encounter for screening mammogram for malignant neoplasm of breast: Secondary | ICD-10-CM | POA: Diagnosis not present

## 2019-12-18 ENCOUNTER — Other Ambulatory Visit: Payer: Self-pay

## 2019-12-18 ENCOUNTER — Encounter: Payer: Self-pay | Admitting: Emergency Medicine

## 2019-12-18 ENCOUNTER — Ambulatory Visit
Admission: EM | Admit: 2019-12-18 | Discharge: 2019-12-18 | Disposition: A | Discharge status: Home / Self Care | Payer: Medicare HMO | Attending: Emergency Medicine | Admitting: Emergency Medicine

## 2019-12-18 DIAGNOSIS — R300 Dysuria: Secondary | ICD-10-CM

## 2019-12-18 LAB — URINALYSIS, COMPLETE (UACMP) WITH MICROSCOPIC
Bacteria, UA: NONE SEEN
Bilirubin Urine: NEGATIVE
Glucose, UA: NEGATIVE mg/dL
Hgb urine dipstick: NEGATIVE
Ketones, ur: NEGATIVE mg/dL
Nitrite: NEGATIVE
Protein, ur: NEGATIVE mg/dL
RBC / HPF: NONE SEEN RBC/hpf (ref 0–5)
Specific Gravity, Urine: 1.025 (ref 1.005–1.030)
pH: 6.5 (ref 5.0–8.0)

## 2019-12-18 MED ORDER — CEPHALEXIN 500 MG PO CAPS: 500.0000 mg | ORAL_CAPSULE | Freq: Two times a day (BID) | ORAL | 0 refills | Status: AC

## 2019-12-18 NOTE — Discharge Instructions (Addendum)
Take medication as prescribed. Rest. Drink plenty of fluids.  ° °Follow up with your primary care physician this week as needed. Return to Urgent care for new or worsening concerns.  ° °

## 2019-12-18 NOTE — ED Provider Notes (Signed)
[facility] ____________________________________________  Time seen: Approximately 10:32 AM  I have reviewed the triage vital signs and the nursing notes.   HISTORY  Chief Complaint Urinary Frequency and Dysuria   HPI Candice Cook is a 78 y.o. female presenting for evaluation of dysuria.  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  Presenting for evaluation of dysuria present for the last 2 to 3 days.  States urinary pressure, urgency and frequency.  Denies abdominal pain, back pain, fevers, vomiting, diarrhea or recent sickness.  States this feels consistent with her previous UTIs.  Denies aggravating or alleviating factors.  Denies recent antibiotic use.  Ezequiel Kayser, MD : PCP    Past Medical History:  Diagnosis Date  . Anxiety and depression   . Arthritis   . Broken ankle   . Cancer ([place])    skin ca  . Depression   . Diabetes mellitus without complication ([place])   . GERD (gastroesophageal reflux disease)   . Gout   . Gout   . Heart murmur   . Hypercholesteremia   . Hypertension   . Mixed incontinence   . Pneumonia    Right lower lobe pneumonia  . Thyroid condition   . Wears dentures    full upper and lower    Patient Active Problem List   Diagnosis Date Noted  . 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 Acute on chronic respiratory failure with hypoxia ([place]) 12/02/2018  . Acute respiratory failure with hypoxia ([place]) 12/02/2018  . Tick bite 06/20/2018  . Pessary maintenance 01/11/2018  . Cellulitis and abscess of right lower extremity 12/28/2017  . Vaginal polyp 12/12/2017  . Vaginal atrophy 12/12/2017  . Cystocele, midline 12/12/2017  . Mixed incontinence 03/23/2016    Past Surgical History:  Procedure Laterality Date  . ABDOMINAL HYSTERECTOMY    . APPENDECTOMY  1993  . BROW LIFT Bilateral 02/27/2019   Procedure: BLEPHAROPLASTY  UPPER EYELID W/EXCESS SKIN;  Surgeon: Karle Starch, MD;  Location: [place];  Service: Ophthalmology;  Laterality: Bilateral;  . BROW PTOSIS Bilateral 02/27/2019   Procedure: BROW PTOSIS REPAIR;  Surgeon: Karle Starch, MD;  Location: [place];  Service: Ophthalmology;  Laterality: Bilateral;  Diabetic - oral meds  . CATARACT EXTRACTION W/PHACO Left 03/22/2017   Procedure: CATARACT EXTRACTION PHACO AND INTRAOCULAR LENS PLACEMENT ([place]) left diabetic;  Surgeon: Eulogio Bear, MD;  Location: [place];  Service: Ophthalmology;  Laterality: Left;  Diabetic  . CATARACT EXTRACTION W/PHACO Right 05/17/2017   Procedure: CATARACT EXTRACTION PHACO AND INTRAOCULAR LENS PLACEMENT ([place]) Right diabetic;  Surgeon: Eulogio Bear, MD;  Location: [place];  Service: Ophthalmology;  Laterality: Right;  . CHOLECYSTECTOMY    . COLONOSCOPY WITH PROPOFOL N/A 11/14/2017   Procedure: COLONOSCOPY WITH PROPOFOL;  Surgeon: Lollie Sails, MD;  Location: [facility] ENDOSCOPY;  Service: Endoscopy;  Laterality: N/A;  . EYE SURGERY     cataracts with extraocular prosthesis  . KIDNEY SURGERY     gave daughter a kidney  . NEPHRECTOMY Left 1993   donated to her daughter     No current facility-administered medications for this encounter.  Current Outpatient Medications:  .  allopurinol (ZYLOPRIM) 100 MG tablet, Take 200 mg daily by mouth., Disp: , Rfl:  .  aspirin EC 81 MG tablet, Take 81 mg by mouth daily., Disp: , Rfl:  .  Blood Glucose Calibration (LIBERTY  GLUCOSE CONTROL) Normal LIQD, Use as directed., Disp: , Rfl:  .  Cyanocobalamin 2500 MCG SUBL, Place under the tongue., Disp: , Rfl:  .  gabapentin (NEURONTIN) 300 MG capsule, Take 900 mg by mouth at bedtime. , Disp: , Rfl:  .  glimepiride (AMARYL) 2 MG tablet, Take 2 mg by mouth daily with breakfast., Disp: , Rfl:  .  indomethacin (INDOCIN) 25 MG capsule, Take 25 mg by mouth daily. Reported on 05/17/2016, Disp: , Rfl:  .   Lancets (ONETOUCH ULTRASOFT) lancets, USE 1 EACH ONCE DAILY. USE AS INSTRUCTED. ONE TOUCH LANCETS, Disp: , Rfl: 3 .  lisinopril-hydrochlorothiazide (PRINZIDE,ZESTORETIC) 20-12.5 MG tablet, TAKE 1 TABLET BY MOUTH EVERY MORNING. FOR BLOOD PRESSURE, Disp: , Rfl: 3 .  memantine (NAMENDA) 5 MG tablet, Take 5 mg by mouth 2 (two) times daily., Disp: , Rfl:  .  omeprazole (PRILOSEC) 40 MG capsule, Take 40 mg by mouth daily., Disp: , Rfl:  .  ONE TOUCH ULTRA TEST test strip, USE ONCE DAILY. AS INSTRUCTED., Disp: , Rfl: 3 .  sertraline (ZOLOFT) 50 MG tablet, Take 50 mg by mouth daily. , Disp: , Rfl:  .  simvastatin (ZOCOR) 20 MG tablet, Take 20 mg daily by mouth., Disp: , Rfl:  .  traZODone (DESYREL) 50 MG tablet, Take 100 mg by mouth at bedtime. , Disp: , Rfl:  .  cephALEXin (KEFLEX) 500 MG capsule, Take 1 capsule (500 mg total) by mouth 2 (two) times daily for 7 days., Disp: 14 capsule, Rfl: 0  Allergies Mirabegron and Metformin and related  Family History  Problem Relation Age of Onset  . Heart disease Mother   . Heart disease Father   . Diabetes Father   . Hypertension Father   . Cancer Brother   . Kidney disease Daughter   . Bladder Cancer Neg Hx   . Kidney cancer Neg Hx   . Breast cancer Neg Hx     Social History Social History   Tobacco Use  . Smoking status: Former Research scientist (life sciences)  . Smokeless tobacco: Never Used  . Tobacco comment: quit 25 years  Substance Use Topics  . Alcohol use: No    Alcohol/week: 0.0 standard drinks  . Drug use: No    Review of Systems Constitutional: No fever ENT: No sore throat. Cardiovascular: Denies chest pain. Respiratory: Denies shortness of breath. Gastrointestinal: No abdominal pain.  No nausea, no vomiting.  No diarrhea.   Genitourinary: Positive for dysuria. Musculoskeletal: Negative for back pain. Skin: Negative for rash.   ____________________________________________   PHYSICAL EXAM:  VITAL SIGNS: ED Triage Vitals  Enc Vitals Group      BP 12/18/19 0940 (!) 148/59     Pulse Rate 12/18/19 0940 (!) 56     Resp 12/18/19 0940 16     Temp 12/18/19 0940 98.3 F (36.8 C)     Temp Source 12/18/19 0940 Oral     SpO2 12/18/19 0940 96 %     Weight 12/18/19 0939 129 lb (58.5 kg)     Height 12/18/19 0939 5' (1.524 m)     Head Circumference --      Peak Flow --      Pain Score 12/18/19 0939 8     Pain Loc --      Pain Edu? --      Excl. in [place]? --     Constitutional: Alert and oriented. Well appearing and in no acute distress. Eyes: Conjunctivae are normal.  ENT  Head: Normocephalic and atraumatic. Cardiovascular: Normal rate, regular rhythm. Grossly normal heart sounds.  Good peripheral circulation. Respiratory: Normal respiratory effort without tachypnea nor retractions. Breath sounds are clear and equal bilaterally. No wheezes, rales, rhonchi. Gastrointestinal: Soft and nontender. No CVA tenderness. Musculoskeletal:  Steady gait. Neurologic:  Normal speech and language.  Skin:  Skin is warm, dry and intact. No rash noted. Psychiatric: Mood and affect are normal. Speech and behavior are normal. Patient exhibits appropriate insight and judgment   ___________________________________________   LABS (all labs ordered are listed, but only abnormal results are displayed)  Labs Reviewed  URINALYSIS, COMPLETE (UACMP) WITH MICROSCOPIC - Abnormal; Notable for the following components:      Result Value   Leukocytes,Ua MODERATE (*)    All other components within normal limits  URINE CULTURE   ____________________________________________   PROCEDURES Procedures     INITIAL IMPRESSION / ASSESSMENT AND PLAN / ED COURSE  Pertinent labs & imaging results that were available during my care of the patient were reviewed by me and considered in my medical decision making (see chart for details).  Well-appearing patient.  No acute distress.  Dysuria complaints.  Urinalysis no clear UTI, however subjective report past  history, concern for UTI.  We will culture and empirically start on oral Keflex.  Monitor, fluids and supportive care.Discussed indication, risks and benefits of medications with patient.   Discussed follow up with Primary care physician this week. Discussed follow up and return parameters including no resolution or any worsening concerns. Patient verbalized understanding and agreed to plan.   ____________________________________________   FINAL CLINICAL IMPRESSION(S) / ED DIAGNOSES  Final diagnoses:  Dysuria     ED Discharge Orders         Ordered    cephALEXin (KEFLEX) 500 MG capsule  2 times daily     12/18/19 1023           Note: This dictation was prepared with Dragon dictation along with smaller phrase technology. Any transcriptional errors that result from this process are unintentional.         Marylene Land, NP 12/18/19 1052

## 2019-12-18 NOTE — ED Triage Notes (Signed)
Patient in today c/o urinary frequency and dysuria x 4 days.

## 2019-12-19 LAB — URINE CULTURE

## 2020-02-29 ENCOUNTER — Other Ambulatory Visit: Payer: Self-pay | Admitting: Acute Care

## 2020-02-29 ENCOUNTER — Other Ambulatory Visit (HOSPITAL_COMMUNITY): Payer: Self-pay | Admitting: Acute Care

## 2020-02-29 DIAGNOSIS — M51369 Other intervertebral disc degeneration, lumbar region without mention of lumbar back pain or lower extremity pain: Secondary | ICD-10-CM

## 2020-02-29 DIAGNOSIS — G35 Multiple sclerosis: Secondary | ICD-10-CM

## 2020-02-29 DIAGNOSIS — M5136 Other intervertebral disc degeneration, lumbar region: Secondary | ICD-10-CM

## 2020-03-11 ENCOUNTER — Ambulatory Visit
Admission: RE | Admit: 2020-03-11 | Discharge: 2020-03-11 | Disposition: A | Discharge status: Home / Self Care | Payer: Medicare HMO | Source: Ambulatory Visit | Attending: Acute Care | Admitting: Acute Care

## 2020-03-11 ENCOUNTER — Other Ambulatory Visit: Payer: Self-pay

## 2020-03-11 DIAGNOSIS — G35 Multiple sclerosis: Secondary | ICD-10-CM | POA: Diagnosis present

## 2020-03-11 DIAGNOSIS — M5136 Other intervertebral disc degeneration, lumbar region: Secondary | ICD-10-CM | POA: Diagnosis present

## 2020-03-11 LAB — POCT I-STAT CREATININE: Creatinine, Ser: 0.9 mg/dL (ref 0.44–1.00)

## 2020-03-11 MED ORDER — GADOBUTROL 1 MMOL/ML IV SOLN
5.0000 mL | Freq: Once | INTRAVENOUS | Status: AC | PRN
  Administered 2020-03-11: 5 mL via INTRAVENOUS

## 2020-03-20 DIAGNOSIS — M5136 Other intervertebral disc degeneration, lumbar region: Secondary | ICD-10-CM | POA: Insufficient documentation

## 2020-03-20 DIAGNOSIS — M51369 Other intervertebral disc degeneration, lumbar region without mention of lumbar back pain or lower extremity pain: Secondary | ICD-10-CM | POA: Insufficient documentation

## 2020-04-21 DIAGNOSIS — M5442 Lumbago with sciatica, left side: Secondary | ICD-10-CM | POA: Insufficient documentation

## 2020-04-21 DIAGNOSIS — G8929 Other chronic pain: Secondary | ICD-10-CM

## 2020-04-21 DIAGNOSIS — M48061 Spinal stenosis, lumbar region without neurogenic claudication: Secondary | ICD-10-CM | POA: Insufficient documentation

## 2020-04-21 HISTORY — DX: Other chronic pain: G89.29

## 2020-09-16 ENCOUNTER — Ambulatory Visit (INDEPENDENT_AMBULATORY_CARE_PROVIDER_SITE_OTHER): Payer: Medicare Other

## 2020-09-16 ENCOUNTER — Ambulatory Visit
Admission: EM | Admit: 2020-09-16 | Discharge: 2020-09-16 | Disposition: A | Discharge status: Home / Self Care | Payer: Medicare Other | Attending: Emergency Medicine | Admitting: Emergency Medicine

## 2020-09-16 ENCOUNTER — Other Ambulatory Visit: Payer: Self-pay

## 2020-09-16 DIAGNOSIS — R079 Chest pain, unspecified: Secondary | ICD-10-CM | POA: Diagnosis not present

## 2020-09-16 DIAGNOSIS — S2241XA Multiple fractures of ribs, right side, initial encounter for closed fracture: Secondary | ICD-10-CM | POA: Diagnosis not present

## 2020-09-16 DIAGNOSIS — R0781 Pleurodynia: Secondary | ICD-10-CM

## 2020-09-16 DIAGNOSIS — W19XXXA Unspecified fall, initial encounter: Secondary | ICD-10-CM | POA: Diagnosis not present

## 2020-09-16 NOTE — ED Provider Notes (Signed)
[facility]    CSN: [id number] Arrival date & time: 09/16/20  1013      History   Chief Complaint Chief Complaint  Patient presents with  . Fall  . Rib Injury    HPI Candice Cook is a 79 y.o. female.   Patient states that she fell 3 days ago.  She says she believes she lost her balance, she says she "does not a lot."  She says that she fell forward and hit her ribs on the right side on a desk.  She says her son told her she needed to be seen.  Patient says she only has rib pain when she takes a big breath.  She denies any fever, cough, shortness of breath.  Denies any other injuries or pain.  Patient says she did not have any symptoms before her fall.  She denies any dizziness, headaches, chest pain, palpitations.  She denies syncope and recalls the event.  She has been taking aspirin for pain but no other medications.  She has no other concerns.  HPI  Past Medical History:  Diagnosis Date  . Anxiety and depression   . Arthritis   . Broken ankle   . Cancer ([place])    skin ca  . Depression   . Diabetes mellitus without complication ([place])   . GERD (gastroesophageal reflux disease)   . Gout   . Gout   . Heart murmur   . Hypercholesteremia   . Hypertension   . Mixed incontinence   . Pneumonia    Right lower lobe pneumonia  . Thyroid condition   . Wears dentures    full upper and lower    Patient Active Problem List   Diagnosis Date Noted  . 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 Acute on chronic respiratory failure with hypoxia ([place]) 12/02/2018  . Acute respiratory failure with hypoxia ([place]) 12/02/2018  . Tick bite 06/20/2018  . Pessary maintenance 01/11/2018  . Cellulitis and abscess of right lower extremity 12/28/2017  . Vaginal polyp 12/12/2017  . Vaginal atrophy 12/12/2017  . Cystocele, midline 12/12/2017  . Mixed incontinence 03/23/2016    Past Surgical History:  Procedure Laterality Date  . ABDOMINAL HYSTERECTOMY    . APPENDECTOMY  1993  . BROW  LIFT Bilateral 02/27/2019   Procedure: BLEPHAROPLASTY UPPER EYELID W/EXCESS SKIN;  Surgeon: Karle Starch, MD;  Location: [place];  Service: Ophthalmology;  Laterality: Bilateral;  . BROW PTOSIS Bilateral 02/27/2019   Procedure: BROW PTOSIS REPAIR;  Surgeon: Karle Starch, MD;  Location: [place];  Service: Ophthalmology;  Laterality: Bilateral;  Diabetic - oral meds  . CATARACT EXTRACTION W/PHACO Left 03/22/2017   Procedure: CATARACT EXTRACTION PHACO AND INTRAOCULAR LENS PLACEMENT ([place]) left diabetic;  Surgeon: Eulogio Bear, MD;  Location: [place];  Service: Ophthalmology;  Laterality: Left;  Diabetic  . CATARACT EXTRACTION W/PHACO Right 05/17/2017   Procedure: CATARACT EXTRACTION PHACO AND INTRAOCULAR LENS PLACEMENT ([place]) Right diabetic;  Surgeon: Eulogio Bear, MD;  Location: [place];  Service: Ophthalmology;  Laterality: Right;  . CHOLECYSTECTOMY    . COLONOSCOPY WITH PROPOFOL N/A 11/14/2017   Procedure: COLONOSCOPY WITH PROPOFOL;  Surgeon: Lollie Sails, MD;  Location: [facility] ENDOSCOPY;  Service: Endoscopy;  Laterality: N/A;  . EYE SURGERY     cataracts with extraocular prosthesis  . KIDNEY SURGERY     gave daughter a kidney  . 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 Authorizing Provider  allopurinol (ZYLOPRIM) 100 MG tablet Take 200 mg daily by mouth.   Yes [provider]  aspirin EC 81 MG tablet Take 81 mg by mouth daily.   Yes [provider]  Blood Glucose Calibration (LIBERTY GLUCOSE CONTROL) Normal LIQD Use as directed. 02/20/14  Yes [provider]  Cyanocobalamin 2500 MCG SUBL Place under the tongue.   Yes  [provider]  glimepiride (AMARYL) 2 MG tablet Take 2 mg by mouth daily with breakfast.   Yes [provider]  indomethacin (INDOCIN) 25 MG capsule Take 25 mg by mouth daily. Reported on 05/17/2016 03/08/12  Yes [provider]  Lancets (ONETOUCH ULTRASOFT) lancets USE 1 EACH ONCE DAILY. USE AS INSTRUCTED. ONE TOUCH LANCETS 02/20/16  Yes [provider]  lisinopril-hydrochlorothiazide (PRINZIDE,ZESTORETIC) 20-12.5 MG tablet TAKE 1 TABLET BY MOUTH EVERY MORNING. FOR BLOOD PRESSURE 03/09/18  Yes [provider]  memantine (NAMENDA) 5 MG tablet Take 5 mg by mouth 2 (two) times daily.   Yes [provider]  omeprazole (PRILOSEC) 40 MG capsule Take 40 mg by mouth daily.   Yes [provider]  ONE TOUCH ULTRA TEST test strip USE ONCE DAILY. AS INSTRUCTED. 02/20/16  Yes [provider]  sertraline (ZOLOFT) 50 MG tablet Take 50 mg by mouth daily.  02/12/13  Yes [provider]  simvastatin (ZOCOR) 20 MG tablet Take 20 mg daily by mouth.   Yes [provider]  traZODone (DESYREL) 50 MG tablet Take 100 mg by mouth at bedtime.    Yes [provider]  gabapentin (NEURONTIN) 300 MG capsule Take 900 mg by mouth at bedtime.  03/15/16   [provider]    Family History Family History  Problem Relation Age of Onset  . Heart disease Mother   . Heart disease Father   . Diabetes Father   . Hypertension Father   . Cancer Brother   . Kidney disease Daughter   . Bladder Cancer Neg Hx   . Kidney cancer Neg Hx   . Breast cancer Neg Hx     Social History Social History   Tobacco Use  . Smoking status: Former Research scientist (life sciences)  . Smokeless tobacco: Never Used  . Tobacco comment: quit 25 years  Vaping Use  . Vaping Use: Never used  Substance Use Topics  . Alcohol use: No    Alcohol/week: 0.0 standard drinks  . Drug use: No     Allergies   Mirabegron and Metformin and related   Review of Systems Review  of Systems  Constitutional: Negative for chills, fatigue and fever.  Eyes: Negative for visual disturbance.  Respiratory: Negative for cough and shortness of breath.   Cardiovascular: Positive for chest pain (rib pain). Negative for palpitations.  Gastrointestinal: Negative for abdominal pain, nausea and vomiting.  Musculoskeletal: Negative for arthralgias and back pain.  Skin: Negative for color change and rash.  Neurological: Negative for dizziness, seizures,  syncope, weakness and headaches.  Hematological: Does not bruise/bleed easily.  Psychiatric/Behavioral: Negative for confusion.  All other systems reviewed and are negative.    Physical Exam Triage Vital Signs ED Triage Vitals  Enc Vitals Group     BP 09/16/20 1219 (!) 188/63     Pulse Rate 09/16/20 1219 93     Resp 09/16/20 1219 18     Temp 09/16/20 1219 98.2 F (36.8 C)     Temp Source 09/16/20 1219 Oral     SpO2 09/16/20 1219 100 %     Weight 09/16/20 1216 128 lb 15.5 oz (58.5 kg)     Height 09/16/20 1216 5' (1.524 m)     Head Circumference --      Peak Flow --      Pain Score 09/16/20 1216 6     Pain Loc --      Pain Edu? --      Excl. in [place]? --    No data found.  Updated Vital Signs BP (!) 188/63 (BP Location: Right Arm)   Pulse 93   Temp 98.2 F (36.8 C) (Oral)   Resp 18   Ht 5' (1.524 m)   Wt 128 lb 15.5 oz (58.5 kg)   SpO2 100%   BMI 25.19 kg/m       Physical Exam Vitals and nursing note reviewed.  Constitutional:      General: She is not in acute distress.    Appearance: Normal appearance. She is not ill-appearing, toxic-appearing or diaphoretic.  HENT:     Head: Normocephalic and atraumatic.  Eyes:     General: No scleral icterus.       Right eye: No discharge.        Left eye: No discharge.     Conjunctiva/sclera: Conjunctivae normal.  Cardiovascular:     Rate and Rhythm: Normal rate and regular rhythm.     Heart sounds: Normal heart sounds.  Pulmonary:     Effort: Pulmonary effort  is normal. No respiratory distress.     Breath sounds: Normal breath sounds. No stridor. No wheezing, rhonchi or rales.  Chest:     Chest wall: Tenderness (ttp anterior and lateral 8th and 9th ribs on the right. No swelling/echhymosis or step offs noted) present.  Musculoskeletal:     Cervical back: Neck supple.  Skin:    General: Skin is dry.     Findings: No bruising, erythema, lesion or rash.  Neurological:     General: No focal deficit present.     Mental Status: She is alert and oriented to person, place, and time. Mental status is at baseline.     Cranial Nerves: No cranial nerve deficit.     Motor: No weakness.     Coordination: Coordination normal.     Gait: Gait normal.  Psychiatric:        Mood and Affect: Mood normal.        Behavior: Behavior normal.        Thought Content: Thought content normal.      UC Treatments / Results  Labs (all labs ordered are listed, but only abnormal results are displayed) Labs Reviewed - No data to display  EKG   Radiology DG Ribs Unilateral W/Chest Right  Result Date: 09/16/2020 CLINICAL DATA:  Fall.  Pain. EXAM: RIGHT RIBS AND CHEST - 3+ VIEW COMPARISON:  No recent prior. FINDINGS: Minimally displaced right posterior eighth and ninth rib fractures noted. Mild right base atelectasis. Tiny right pleural  effusion. IMPRESSION: Minimally displaced right posterior eighth and ninth rib fractures. No pneumothorax. Small right pleural effusion. Electronically Signed   By: Marcello Moores  Register   On: 09/16/2020 12:57    Procedures Procedures (including critical care time)  Medications Ordered in UC Medications - No data to display  Initial Impression / Assessment and Plan / UC Course  I have reviewed the triage vital signs and the nursing notes.  Pertinent labs & imaging results that were available during my care of the patient were reviewed by me and considered in my medical decision making (see chart for details).   Presenting for  right-sided rib pain following fall a couple days ago.  X-rays fractures of the eighth and ninth ribs.  There is a "tiny" pleural effusion.  Upon auscultation of lungs, they are clear.  She has a normal respiratory rate and denies any difficulty breathing or wheezing.  She also denies cough or fever.  Patient given abdominal Ace wrap.  Offered low-dose of pain medication, but she declined at this time.  Advised her to continue Tylenol for pain relief.  Discussed care of rib fracture with patient and provider with handouts.  ED precautions discussed especially if she seems to be developing pneumonia.  Patient blood pressure elevated at 188/63.  Advised her to follow-up with PCP regarding better blood pressure control.   Final Clinical Impressions(s) / UC Diagnoses   Final diagnoses:  Closed fracture of multiple ribs of right side, initial encounter  Fall, initial encounter     Discharge Instructions     Your x-ray shows that you have 2 rib fractures.  You can take Tylenol for pain relief.  You may ice the area for pain relief.  You can wear the abdominal binder as given today.  Try to make sure you are taking deep breaths throughout the day he can hold the pillow to your side while you do this.  If you develop a fever, cough, shortness of breath you should follow-up with [place] to ensure you do not have pneumonia.  Keep your follow-up appointment with your PCP in a week.  Follow-up with [place] as needed for any new or worsening symptoms.    ED Prescriptions    None     PDMP not reviewed this encounter.   Danton Clap, PA-C 09/17/20 1331

## 2020-09-16 NOTE — Discharge Instructions (Addendum)
Your x-ray shows that you have 2 rib fractures.  You can take Tylenol for pain relief.  You may ice the area for pain relief.  You can wear the abdominal binder as given today.  Try to make sure you are taking deep breaths throughout the day he can hold the pillow to your side while you do this.  If you develop a fever, cough, shortness of breath you should follow-up with Candice Cook to ensure you do not have pneumonia.  Keep your follow-up appointment with your PCP in a week.  Follow-up with Candice Cook as needed for any new or worsening symptoms.

## 2020-09-16 NOTE — ED Triage Notes (Signed)
Patient complains of fall that occurred 3 days ago while at home. States that she fell and hit a desk with her ribs. States that area is painful and hurts worse with taking a breath.

## 2020-10-02 ENCOUNTER — Ambulatory Visit: Payer: Medicare Other | Attending: Internal Medicine | Admitting: Physical Therapy

## 2020-10-02 ENCOUNTER — Encounter: Payer: Self-pay | Admitting: Physical Therapy

## 2020-10-02 ENCOUNTER — Other Ambulatory Visit: Payer: Self-pay

## 2020-10-02 DIAGNOSIS — R2689 Other abnormalities of gait and mobility: Secondary | ICD-10-CM | POA: Diagnosis present

## 2020-10-02 DIAGNOSIS — M6281 Muscle weakness (generalized): Secondary | ICD-10-CM | POA: Diagnosis present

## 2020-10-02 NOTE — Patient Instructions (Signed)
Access Code: [email or web address] URL: [email or web address] Date: 10/02/2020 Prepared by: Dorcas Carrow  Exercises Seated Long Arc Quad - 1 x daily - 7 x weekly - 3 sets - 10 reps Seated March with Resistance - 1 x daily - 7 x weekly - 3 sets - 10 reps Seated Hip Abduction with Resistance - 1 x daily - 7 x weekly - 3 sets - 10 reps Sit to Stand with Counter Support - 1 x daily - 7 x weekly - 3 sets - 10 reps Heel rises with counter support - 1 x daily - 7 x weekly - 3 sets - 10 reps

## 2020-10-02 NOTE — Therapy (Signed)
[place] [facility] [address] [place], [place], [address] Phone: [phone]   Fax:  [phone]  Physical Therapy Evaluation  Patient Details  Name: Candice Cook MRN: [id number] Date of Birth: [birth year] Referring Provider (PT): Mancel Bale, MD   Encounter Date: 10/02/2020   PT End of Session - 10/02/20 0859    Visit Number 1    Number of Visits 5    Date for PT Re-Evaluation 10/30/20    PT Start Time 0900    PT Stop Time 0955    PT Time Calculation (min) 55 min    Equipment Utilized During Treatment Gait belt    Activity Tolerance Patient tolerated treatment well    Behavior During Therapy [facility] for tasks assessed/performed           Past Medical History:  Diagnosis Date  . Anxiety and depression   . Arthritis   . Broken ankle   . Cancer ([place])    skin ca  . Depression   . Diabetes mellitus without complication ([place])   . GERD (gastroesophageal reflux disease)   . Gout   . Gout   . Heart murmur   . Hypercholesteremia   . Hypertension   . Mixed incontinence   . Pneumonia    Right lower lobe pneumonia  . Thyroid condition   . Wears dentures    full upper and lower    Past Surgical History:  Procedure Laterality Date  . ABDOMINAL HYSTERECTOMY    . APPENDECTOMY  1993  . BROW LIFT Bilateral 02/27/2019   Procedure: BLEPHAROPLASTY UPPER EYELID W/EXCESS SKIN;  Surgeon: Karle Starch, MD;  Location: [place];  Service: Ophthalmology;  Laterality: Bilateral;  . BROW PTOSIS Bilateral 02/27/2019   Procedure: BROW PTOSIS REPAIR;  Surgeon: Karle Starch, MD;  Location: [place];  Service: Ophthalmology;  Laterality: Bilateral;  Diabetic - oral meds  . CATARACT EXTRACTION W/PHACO Left 03/22/2017   Procedure: CATARACT EXTRACTION PHACO AND INTRAOCULAR LENS PLACEMENT ([place]) left diabetic;  Surgeon: Eulogio Bear, MD;  Location: [place];  Service: Ophthalmology;  Laterality: Left;  Diabetic   . CATARACT EXTRACTION W/PHACO Right 05/17/2017   Procedure: CATARACT EXTRACTION PHACO AND INTRAOCULAR LENS PLACEMENT ([place]) Right diabetic;  Surgeon: Eulogio Bear, MD;  Location: [place];  Service: Ophthalmology;  Laterality: Right;  . CHOLECYSTECTOMY    . COLONOSCOPY WITH PROPOFOL N/A 11/14/2017   Procedure: COLONOSCOPY WITH PROPOFOL;  Surgeon: Lollie Sails, MD;  Location: [facility] ENDOSCOPY;  Service: Endoscopy;  Laterality: N/A;  . EYE SURGERY     cataracts with extraocular prosthesis  . KIDNEY SURGERY     gave daughter a kidney  . 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    Subjective Assessment - 10/02/20 0908    Subjective Pt. a 79 y.o. female with imbalance and "wobbliness" that started about 3 years ago. Pt. states the wobbliness got worse this past year, and has fallen about 2 times. She states that she has had multiple near falls where she has had to catch herself. Pt. states that she feels good standing still, most falls have occured when walking. Pt. denies dizziness. Currently she is retired and lives alone. Pt. has 5 STE and is in a single story mobile home. Pt. states that she does not know what happened to cause the falls, but she has been able to  get up. Pt. states that she has L calf pain that is 7/10 when she's walking. Pt. states that the pain comes on about 15 mins into walking, and goes away immediatley after sitting down. Pt. states that she can clean her house for about 15 mins before needing a break.    Pertinent History Pt. currently retired.    How long can you walk comfortably? 15 mins    Patient Stated Goals Ride a bike (training wheels), walk to mailbox    Currently in Pain? No/denies              [facility] PT Assessment - 10/02/20 0001      Assessment   Medical Diagnosis Gait disorder    Referring Provider (PT) Mancel Bale, MD    Onset Date/Surgical Date 12/28/19    Prior  Therapy yes      Precautions   Precautions Fall      Balance Screen   Has the patient fallen in the past 6 months Yes    How many times? 3x      [place] residence           OBJECTIVE MUSCULOSKELETAL: Tremor: Absent Bulk: Normal Tone: Normal, no clonus  Posture No gross abnormalities noted in standing or seated posture  Gait No gross abnormalities in gait noted  Strength R/L 3+/3+ Hip flexion 5/5 Hip abduction 4+/4+ Hip adduction 5/4+ Knee extension 5/4 Knee flexion 5/5 Ankle Plantarflexion 5/4+ Ankle Dorsiflexion   NEUROLOGICAL: Mental Status Patient is oriented to person, place and time.  Recent memory is intact.  Remote memory is intact.  Attention span and concentration are intact.  Expressive speech is intact.  Patient's fund of knowledge is within normal limits for educational level.  Sensation Grossly intact to light touch bilateral LEs   FUNCTIONAL OUTCOME MEASURES   Results Comments  DGI 22/24   Single leg stance  Unable to stand on one foot Pt demonstrates no balance strategy   FOTO 55/65   10 Meter Gait Speed Self-selected: 0.98 m/s;  Below normative values for full community ambulation   POSTURAL CONTROL TESTS  Modified Clinical Test of Sensory Interaction for Balance (CTSIB):  CONDITION TIME STRATEGY SWAY  Eyes open, firm surface 30 seconds ankle Stable  Eyes closed, firm surface 30 seconds None noted Postural sway noted   Eyes open, foam surface 30 seconds ankle Stable  Eyes closed, foam surface 30 seconds None noted Postural sway          Objective measurements completed on examination: See above findings.    TREATMENT:  Standing marches: 10x each leg  Seated RTB resisted marches: 10x each leg  Seated RTB resisted hip abd: 10x   STS practice: 3x demonstrating good form  Standing heel raises with // bar support: 5x to check for form for HEP           PT Education -  10/02/20 1602    Education Details See HEP    Person(s) Educated Patient    Methods Explanation;Demonstration;Handout    Comprehension Verbalized understanding;Returned demonstration               PT Long Term Goals - 10/02/20 1004      PT LONG TERM GOAL #1   Title Pt. will improve FOTO score to 65 to improve pain free functional mobility.    Baseline 10/7: 55    Time 4    Period Weeks    Status New  Target Date 10/30/20      PT LONG TERM GOAL #2   Title Pt. will report no new falls/LOB at home over the next 4 weeks to improve safety and independence with complete ADLs at home.    Baseline 10/7: pt reports frequent LOB at home    Time 4    Period Weeks    Status New    Target Date 10/30/20      PT LONG TERM GOAL #3   Title Pt. independent with HEP to improve gross LE strength by at least half a muslce grade to improve ability to complete ADLs independently.    Baseline 10/7: R/L out of 5; hip flex: 3+/3+, hip add 4+/4+, knee ext: 5/4+, knee flex: 5/4, ankle dorsiflex: 5/4+    Time 4    Period Weeks    Status New    Target Date 10/30/20      PT LONG TERM GOAL #4   Title Pt. will report ability to walk from home to mailbox with no LOB and no increase in pain above 5/10 in L calf muslce to improve independence with ADLs.    Baseline 10/7: pt reports unalbe to walk to mailbox    Time 4    Period Weeks    Status New    Target Date 10/30/20                  Plan - 10/02/20 1013    Clinical Impression Statement Pt. is a 79 y.o. female with hx of imbalance, recurrent falls, and gross LE weakness. Pt. reports L calf pain 7/10 with walking for 15 mins or greater. Pt. assessed with FOTO, DGI, 10MWT, and CTSIB, scoring is as follows: FOTO: 55/65; DGI: 22/24; 10MWT: self selected speed 0.108m/s, CTSIB: difficulty with eyes closed on firm and unstable surfaces demonstrating no balance compensatory strategies. Pt. was strength tested: R/L out of 5; hip flex: 3+/3+, hip add  4+/4+, knee ext: 5/4+, knee flex: 5/4, ankle dorsiflex: 5/4+. Pt. will benefit from skilled PT to improve strength and balance to decrease falls risk and improve independence with ADLs.    Personal Factors and Comorbidities Time since onset of injury/illness/exacerbation    Examination-Activity Limitations Carry;Locomotion Level    Stability/Clinical Decision Making Stable/Uncomplicated    Clinical Decision Making Low    Rehab Potential Good    PT Frequency 1x / week    PT Duration 4 weeks    PT Treatment/Interventions ADLs/Self Care Home Management;Cryotherapy;Gait training;Stair training;Functional mobility training;Therapeutic activities;Therapeutic exercise;Balance training;Neuromuscular re-education;Patient/family education    PT Next Visit Plan 70mwt and balance training    PT Home Exercise Plan [email or web address]    Consulted and Agree with Plan of Care Patient           Patient will benefit from skilled therapeutic intervention in order to improve the following deficits and impairments:  Abnormal gait, Decreased activity tolerance, Decreased balance, Decreased endurance, Decreased mobility, Decreased strength, Pain  Visit Diagnosis: Imbalance  Muscle weakness (generalized)  Other abnormalities of gait and mobility     Problem List Patient Active Problem List   Diagnosis Date Noted  . 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 Acute on chronic respiratory failure with hypoxia ([place]) 12/02/2018  . Acute respiratory failure with hypoxia ([place]) 12/02/2018  . Tick bite 06/20/2018  . Pessary maintenance 01/11/2018  . Cellulitis and abscess of right lower extremity 12/28/2017  . Vaginal polyp 12/12/2017  . Vaginal atrophy 12/12/2017  . Cystocele, midline 12/12/2017  . Mixed incontinence  03/23/2016   Pura Spice, PT, DPT # [id number], SPT 10/02/2020, 4:04 PM  [place] [facility] [address] [place], [place], [address] Phone: [phone]    Fax:  [phone]  Name: Candice Cook MRN: [id number] Date of Birth: [birth year]

## 2020-10-08 ENCOUNTER — Ambulatory Visit: Payer: Medicare Other | Admitting: Physical Therapy

## 2020-10-15 ENCOUNTER — Ambulatory Visit: Payer: Medicare Other | Admitting: Physical Therapy

## 2020-10-22 ENCOUNTER — Ambulatory Visit: Payer: Medicare Other | Admitting: Physical Therapy

## 2020-10-29 ENCOUNTER — Ambulatory Visit: Payer: Medicare Other | Admitting: Physical Therapy

## 2020-11-05 ENCOUNTER — Other Ambulatory Visit: Payer: Self-pay

## 2020-11-05 ENCOUNTER — Encounter: Payer: Self-pay | Admitting: Physical Therapy

## 2020-11-05 ENCOUNTER — Ambulatory Visit: Payer: Medicare Other | Attending: Internal Medicine | Admitting: Physical Therapy

## 2020-11-05 DIAGNOSIS — R2689 Other abnormalities of gait and mobility: Secondary | ICD-10-CM | POA: Diagnosis not present

## 2020-11-05 DIAGNOSIS — M6281 Muscle weakness (generalized): Secondary | ICD-10-CM | POA: Insufficient documentation

## 2020-11-05 NOTE — Therapy (Signed)
[place] [facility] [address]. [place], [place], [address] Phone: [phone]   Fax:  [phone]  Physical Therapy Treatment and Recertification Recertification period from 10/02/2020 to 11/05/2020  Patient Details  Name: Candice Cook MRN: [id number] Date of Birth: Dec 06, 1941 Referring Provider (PT): Mancel Bale, MD   Encounter Date: 11/05/2020   PT End of Session - 11/05/20 1106    Visit Number 2    Number of Visits 5    Date for PT Re-Evaluation 12/03/20    PT Start Time 1006    PT Stop Time 1054    PT Time Calculation (min) 48 min    Equipment Utilized During Treatment Gait belt    Activity Tolerance Patient tolerated treatment well    Behavior During Therapy [facility] for tasks assessed/performed           Past Medical History:  Diagnosis Date  . Anxiety and depression   . Arthritis   . Broken ankle   . Cancer ([place])    skin ca  . Depression   . Diabetes mellitus without complication ([place])   . GERD (gastroesophageal reflux disease)   . Gout   . Gout   . Heart murmur   . Hypercholesteremia   . Hypertension   . Mixed incontinence   . Pneumonia    Right lower lobe pneumonia  . Thyroid condition   . Wears dentures    full upper and lower    Past Surgical History:  Procedure Laterality Date  . ABDOMINAL HYSTERECTOMY    . APPENDECTOMY  1993  . BROW LIFT Bilateral 02/27/2019   Procedure: BLEPHAROPLASTY UPPER EYELID W/EXCESS SKIN;  Surgeon: Karle Starch, MD;  Location: [place];  Service: Ophthalmology;  Laterality: Bilateral;  . BROW PTOSIS Bilateral 02/27/2019   Procedure: BROW PTOSIS REPAIR;  Surgeon: Karle Starch, MD;  Location: [place];  Service: Ophthalmology;  Laterality: Bilateral;  Diabetic - oral meds  . CATARACT EXTRACTION W/PHACO Left 03/22/2017   Procedure: CATARACT EXTRACTION PHACO AND INTRAOCULAR LENS PLACEMENT ([place]) left diabetic;  Surgeon: Eulogio Bear, MD;  Location:  [place];  Service: Ophthalmology;  Laterality: Left;  Diabetic  . CATARACT EXTRACTION W/PHACO Right 05/17/2017   Procedure: CATARACT EXTRACTION PHACO AND INTRAOCULAR LENS PLACEMENT ([place]) Right diabetic;  Surgeon: Eulogio Bear, MD;  Location: [place];  Service: Ophthalmology;  Laterality: Right;  . CHOLECYSTECTOMY    . COLONOSCOPY WITH PROPOFOL N/A 11/14/2017   Procedure: COLONOSCOPY WITH PROPOFOL;  Surgeon: Lollie Sails, MD;  Location[facility] ENDOSCOPY;  Service: Endoscopy;  Laterality: N/A;  . EYE SURGERY     cataracts with extraocular prosthesis  . KIDNEY SURGERY     gave daughter a kidney  . 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   Subjective Assessment - 11/05/20 1007    Subjective Pt. states that her balance has been feeling "wobbly" but is able to catch herself. Pt. states she has been sleeping pretty good. Pt. has recently lost her son and husband in a short amount of time, is still experiencing greif.    Pertinent History Pt. currently retired.    How long can you walk comfortably? 15 mins    Patient Stated Goals Ride a bike (training wheels), walk to mailbox    Currently in Pain? No/denies  [facility] PT Assessment - 11/05/20 0001      Assessment   Medical Diagnosis Gait disorder    Referring Provider (PT) Mancel Bale, MD    Onset Date/Surgical Date 12/28/19      Home Environment   Living Environment Private residence           There Ex:  Standing marches with 4lb ankle weights: 15x each leg, pt standing at // bars for balance. SBA provided for pt. Pt cued for increasing control and decreasing speed.   HEP practice: 5x each to demonstrate proper form  -STS without use of UE  -Standing hip abd, flex, and ext   Neuro Re-ed:  Tandem walking: 2x length of // bars. CGA provided, pt experienced many LOB but able to correct with use of bars. Pt experienced most  difficulty with L leg stability.   Tandem stance: able to stand for approx 10-15s stances x3 alternating front leg. Pt experienced difficulty with L leg stability.  Standing airex pad: pt able to stand on airex, complete 10x marches each leg. Pt additionally able to stand for approx 15s with eyes closed on airex  Hurdles in // bars: 3x length of bars, 3x6" hurdles. Pt able to complete with CGA, no LOB. Pt progressed to side stepping over hurdles, 2x length of bars. No LOB noted.   Goal Reassessment: see updated goals for details       PT Education - 11/05/20 1059    Education Details Updated HEP    Person(s) Educated Patient    Methods Explanation;Demonstration;Handout    Comprehension Verbalized understanding;Returned demonstration               PT Long Term Goals - 11/05/20 1009      PT LONG TERM GOAL #1   Title Pt. will improve FOTO score to 65 to improve pain free functional mobility.    Baseline 10/7: 57; 11/10: 53    Time 4    Period Weeks    Status On-going    Target Date 12/03/20      PT LONG TERM GOAL #2   Title Pt. will report no new falls/LOB at home over the next 4 weeks to improve safety and independence with complete ADLs at home.    Baseline 10/7: pt reports frequent LOB at home. 11/10: 3x/day of LOB at home. no falls    Time 4    Period Weeks    Status On-going    Target Date 12/03/20      PT LONG TERM GOAL #3   Title Pt. independent with HEP to improve gross LE strength by at least half a muslce grade to improve ability to complete ADLs independently.    Baseline 10/7: R/L out of 5; hip flex: 3+/3+, hip add 4+/4+, knee ext: 5/4+, knee flex: 5/4, ankle dorsiflex: 5/4+; 11/10: R/L out of 5; hip flex: 4/3+, hip add: 5/5, knee ext: 5/4+, knee flex: 5/4+ ankle dorisflex: 5/4    Time 4    Period Weeks    Status On-going    Target Date 12/03/20      PT LONG TERM GOAL #4   Title Pt. will report ability to walk from home to mailbox with no LOB and no  increase in pain above 5/10 in L calf muslce to improve independence with ADLs.    Baseline 10/7: pt reports unalbe to walk to mailbox. 11/10: pt states she has been unable to walk to mailbox, pain is still high.  Time 4    Period Weeks    Status On-going    Target Date 12/03/20                 Plan - 11/05/20 1152    Clinical Impression Statement Pt. returns to therapy after month off due to personal reasons. Goals reassessed, see updated goals for details. Pt. did not have any major regressions since initial evaluation. Pt. completed various static and dynamic balance activities, pt able to complete with mild-mod loss of balance, pt able to correct with ankle and hip strategies. Pt. explained pain in L calf with increased distances of walking, PT examined calf and found no swelling or heat. Slight tenderness to palpation. PT will evaluate further next session. Pt. will continue to benefit from skilled PT to improve strength and balance to decrease falls risk and improve independence with ADLs.    Personal Factors and Comorbidities Time since onset of injury/illness/exacerbation    Examination-Activity Limitations Carry;Locomotion Level    Stability/Clinical Decision Making Stable/Uncomplicated    Clinical Decision Making Low    Rehab Potential Good    PT Frequency 1x / week    PT Duration 4 weeks    PT Treatment/Interventions ADLs/Self Care Home Management;Cryotherapy;Gait training;Stair training;Functional mobility training;Therapeutic activities;Therapeutic exercise;Balance training;Neuromuscular re-education;Patient/family education    PT Next Visit Plan Dynamic balance activities    PT Home Exercise Plan [email or web address]    Consulted and Agree with Plan of Care Patient           Patient will benefit from skilled therapeutic intervention in order to improve the following deficits and impairments:  Abnormal gait, Decreased activity tolerance, Decreased balance, Decreased endurance,  Decreased mobility, Decreased strength, Pain  Visit Diagnosis: Imbalance  Muscle weakness (generalized)  Other abnormalities of gait and mobility     Problem List Patient Active Problem List   Diagnosis Date Noted  . 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 Acute on chronic respiratory failure with hypoxia ([place]) 12/02/2018  . Acute respiratory failure with hypoxia ([place]) 12/02/2018  . Tick bite 06/20/2018  . Pessary maintenance 01/11/2018  . Cellulitis and abscess of right lower extremity 12/28/2017  . Vaginal polyp 12/12/2017  . Vaginal atrophy 12/12/2017  . Cystocele, midline 12/12/2017  . Mixed incontinence 03/23/2016   Pura Spice, PT, DPT # [id number], SPT 11/05/2020, 2:24 PM  [place] [facility] [address] [place], [place], [address] Phone: [phone]   Fax:  [phone]  Name: Candice Cook MRN: [id number] Date of Birth: July 16, 1941

## 2020-11-05 NOTE — Patient Instructions (Signed)
Access Code: [email or web address] URL: [email or web address] Date: 11/05/2020 Prepared by: Dorcas Carrow  Exercises Standing March with Counter Support - 1 x daily - 7 x weekly - 3 sets - 10 reps Standing Hip Abduction with Counter Support - 1 x daily - 7 x weekly - 3 sets - 10 reps Standing Hip Extension with Counter Support - 1 x daily - 7 x weekly - 3 sets - 10 reps Sit to Stand - 1 x daily - 7 x weekly - 3 sets - 10 reps

## 2020-11-12 ENCOUNTER — Other Ambulatory Visit: Payer: Self-pay

## 2020-11-12 ENCOUNTER — Ambulatory Visit: Payer: Medicare Other | Admitting: Physical Therapy

## 2020-11-12 ENCOUNTER — Encounter: Payer: Self-pay | Admitting: Physical Therapy

## 2020-11-12 DIAGNOSIS — R2689 Other abnormalities of gait and mobility: Secondary | ICD-10-CM

## 2020-11-12 DIAGNOSIS — M6281 Muscle weakness (generalized): Secondary | ICD-10-CM

## 2020-11-12 NOTE — Therapy (Signed)
[facility] [address] [place], [place], [address] Phone: [phone]   Fax:  [phone]  Physical Therapy Treatment  Patient Details  Name: Candice Cook MRN: [id number] Date of Birth: [birth year] Referring Provider (PT): Mancel Bale, MD   Encounter Date: 11/12/2020   PT End of Session - 11/12/20 1006    Visit Number 3    Number of Visits 5    Date for PT Re-Evaluation 12/03/20    PT Start Time 1007    PT Stop Time 1051    PT Time Calculation (min) 44 min    Equipment Utilized During Treatment Gait belt    Activity Tolerance Patient tolerated treatment well    Behavior During Therapy [facility] for tasks assessed/performed           Past Medical History:  Diagnosis Date  . Anxiety and depression   . Arthritis   . Broken ankle   . Cancer ([place])    skin ca  . Depression   . Diabetes mellitus without complication ([place])   . GERD (gastroesophageal reflux disease)   . Gout   . Gout   . Heart murmur   . Hypercholesteremia   . Hypertension   . Mixed incontinence   . Pneumonia    Right lower lobe pneumonia  . Thyroid condition   . Wears dentures    full upper and lower    Past Surgical History:  Procedure Laterality Date  . ABDOMINAL HYSTERECTOMY    . APPENDECTOMY  1993  . BROW LIFT Bilateral 02/27/2019   Procedure: BLEPHAROPLASTY UPPER EYELID W/EXCESS SKIN;  Surgeon: Karle Starch, MD;  Location: [place];  Service: Ophthalmology;  Laterality: Bilateral;  . BROW PTOSIS Bilateral 02/27/2019   Procedure: BROW PTOSIS REPAIR;  Surgeon: Karle Starch, MD;  Location: [place];  Service: Ophthalmology;  Laterality: Bilateral;  Diabetic - oral meds  . CATARACT EXTRACTION W/PHACO Left 03/22/2017   Procedure: CATARACT EXTRACTION PHACO AND INTRAOCULAR LENS PLACEMENT ([place]) left diabetic;  Surgeon: Eulogio Bear, MD;  Location: [place];  Service: Ophthalmology;  Laterality: Left;  Diabetic   . CATARACT EXTRACTION W/PHACO Right 05/17/2017   Procedure: CATARACT EXTRACTION PHACO AND INTRAOCULAR LENS PLACEMENT ([place]) Right diabetic;  Surgeon: Eulogio Bear, MD;  Location: [place];  Service: Ophthalmology;  Laterality: Right;  . CHOLECYSTECTOMY    . COLONOSCOPY WITH PROPOFOL N/A 11/14/2017   Procedure: COLONOSCOPY WITH PROPOFOL;  Surgeon: Lollie Sails, MD;  Location: [facility] ENDOSCOPY;  Service: Endoscopy;  Laterality: N/A;  . EYE SURGERY     cataracts with extraocular prosthesis  . KIDNEY SURGERY     gave daughter a kidney  . 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   Subjective Assessment - 11/12/20 1008    Subjective Pt. states that she almost fell down the stairs at home on Saturday. Pt. states that she put up a ramp since then to be able to use that instead. She does not remember how she fell.    Pertinent History Pt. currently retired.    How long can you walk comfortably? 15 mins    Patient Stated Goals Ride a bike (training wheels), walk to mailbox    Currently in Pain? No/denies             Neuro Re-ed:  Stair practice: 3x up and down, use of bilateral rails,  cueing to stay upright with going down to decrease forward lean and to heel tap stair below to ensure heel clearance. Pt able to complete with step to pattern descending and reciprocal pattern ascending. SBA provided throughout  Standing bosu ball in front of treadmill arm: standing with no decreasing hand support, 2 mins total, able to stand without hand support with even weight shift. Progressed to 10x forward reaches each arm outside BOS. Progressed to standing marches with progressively decreasing hand support, able to complete 15x each leg. CGA provided.   Standing rebounder tosses: standing 10x normal with 2lb ball, progressed to semi tandem each side (10x), progressing to standing airex and semi tandem each side (10x) all with 2lb  weight. CGA provided.   Standing cone reaches: 12 cones. Initially standing on solid ground reaching, progressed to reaching from airex pad. Cueing to improve hip strategy to get cones placed lower. CGA provided  Single leg cone kicks and picking up cones after: 12x cones. Pt stepping reciprocal pattern with reciprocal kicks, few LOB but able to self correct, CGA provided.   Backwards walking: 4x length of agility ladder in gym. Cueing for increasing step length bilat and staying upright to complete. Pt able to complete with few LOB, able to self correct. CGA provided.      PT Long Term Goals - 11/05/20 1009      PT LONG TERM GOAL #1   Title Pt. will improve FOTO score to 65 to improve pain free functional mobility.    Baseline 10/7: 57; 11/10: 53    Time 4    Period Weeks    Status On-going    Target Date 12/03/20      PT LONG TERM GOAL #2   Title Pt. will report no new falls/LOB at home over the next 4 weeks to improve safety and independence with complete ADLs at home.    Baseline 10/7: pt reports frequent LOB at home. 11/10: 3x/day of LOB at home. no falls    Time 4    Period Weeks    Status On-going    Target Date 12/03/20      PT LONG TERM GOAL #3   Title Pt. independent with HEP to improve gross LE strength by at least half a muslce grade to improve ability to complete ADLs independently.    Baseline 10/7: R/L out of 5; hip flex: 3+/3+, hip add 4+/4+, knee ext: 5/4+, knee flex: 5/4, ankle dorsiflex: 5/4+; 11/10: R/L out of 5; hip flex: 4/3+, hip add: 5/5, knee ext: 5/4+, knee flex: 5/4+ ankle dorisflex: 5/4    Time 4    Period Weeks    Status On-going    Target Date 12/03/20      PT LONG TERM GOAL #4   Title Pt. will report ability to walk from home to mailbox with no LOB and no increase in pain above 5/10 in L calf muslce to improve independence with ADLs.    Baseline 10/7: pt reports unalbe to walk to mailbox. 11/10: pt states she has been unable to walk to mailbox,  pain is still high.    Time 4    Period Weeks    Status On-going    Target Date 12/03/20                 Plan - 11/12/20 1051    Clinical Impression Statement Pt. returned to therapy after an almost fall down the stairs last Saturday. Pt. does not remember how she  lost her balance.  Pt. presents with a small bruise on the R forearm and L elbow.  Pt. practiced various static and dynamic balance activities, pt did well with all with CGA provided. Pt. occasionally loses balance, but continues to be able to self correct in clinic. Pt. still reports "randomly" losing balance at home. Pt. will continue to benefit from skilled PT to improve balance and strength to decrease falls risk and improve independence with ADLs at home.    Personal Factors and Comorbidities Time since onset of injury/illness/exacerbation    Examination-Activity Limitations Carry;Locomotion Level    Stability/Clinical Decision Making Stable/Uncomplicated    Clinical Decision Making Low    Rehab Potential Good    PT Frequency 1x / week    PT Duration 4 weeks    PT Treatment/Interventions ADLs/Self Care Home Management;Cryotherapy;Gait training;Stair training;Functional mobility training;Therapeutic activities;Therapeutic exercise;Balance training;Neuromuscular re-education;Patient/family education    PT Next Visit Plan Dynamic balance activities    PT Home Exercise Plan [id number]    Consulted and Agree with Plan of Care Patient           Patient will benefit from skilled therapeutic intervention in order to improve the following deficits and impairments:  Abnormal gait, Decreased activity tolerance, Decreased balance, Decreased endurance, Decreased mobility, Decreased strength, Pain  Visit Diagnosis: Imbalance  Muscle weakness (generalized)  Other abnormalities of gait and mobility     Problem List Patient Active Problem List   Diagnosis Date Noted  . 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 Acute on chronic  respiratory failure with hypoxia ([place]) 12/02/2018  . Acute respiratory failure with hypoxia ([place]) 12/02/2018  . Tick bite 06/20/2018  . Pessary maintenance 01/11/2018  . Cellulitis and abscess of right lower extremity 12/28/2017  . Vaginal polyp 12/12/2017  . Vaginal atrophy 12/12/2017  . Cystocele, midline 12/12/2017  . Mixed incontinence 03/23/2016   Pura Spice, PT, DPT # [id number], SPT 11/12/2020, 1:53 PM  [facility] [address] [place], [place], [address] Phone: [phone]   Fax:  [phone]  Name: Candice Cook MRN: [id number] Date of Birth: [birth year]

## 2020-11-19 ENCOUNTER — Other Ambulatory Visit: Payer: Self-pay

## 2020-11-19 ENCOUNTER — Ambulatory Visit: Payer: Medicare Other | Admitting: Physical Therapy

## 2020-11-19 ENCOUNTER — Encounter: Payer: Self-pay | Admitting: Physical Therapy

## 2020-11-19 DIAGNOSIS — R2689 Other abnormalities of gait and mobility: Secondary | ICD-10-CM

## 2020-11-19 DIAGNOSIS — M6281 Muscle weakness (generalized): Secondary | ICD-10-CM

## 2020-11-19 NOTE — Therapy (Signed)
[facility] [address] [place], [place], [address] Phone: [phone]   Fax:  [phone]  Physical Therapy Treatment  Patient Details  Name: Candice Cook MRN: [id number] Date of Birth: 1941/04/04 Referring Provider (PT): Mancel Bale, MD   Encounter Date: 11/19/2020   PT End of Session - 11/19/20 0946    Visit Number 4    Number of Visits 5    Date for PT Re-Evaluation 12/03/20    PT Start Time 0939    PT Stop Time 1020    PT Time Calculation (min) 41 min    Equipment Utilized During Treatment Gait belt    Activity Tolerance Patient tolerated treatment well    Behavior During Therapy [facility] for tasks assessed/performed           Past Medical History:  Diagnosis Date  . Anxiety and depression   . Arthritis   . Broken ankle   . Cancer ([place])    skin ca  . Depression   . Diabetes mellitus without complication ([place])   . GERD (gastroesophageal reflux disease)   . Gout   . Gout   . Heart murmur   . Hypercholesteremia   . Hypertension   . Mixed incontinence   . Pneumonia    Right lower lobe pneumonia  . Thyroid condition   . Wears dentures    full upper and lower    Past Surgical History:  Procedure Laterality Date  . ABDOMINAL HYSTERECTOMY    . APPENDECTOMY  1993  . BROW LIFT Bilateral 02/27/2019   Procedure: BLEPHAROPLASTY UPPER EYELID W/EXCESS SKIN;  Surgeon: Karle Starch, MD;  Location: [place];  Service: Ophthalmology;  Laterality: Bilateral;  . BROW PTOSIS Bilateral 02/27/2019   Procedure: BROW PTOSIS REPAIR;  Surgeon: Karle Starch, MD;  Location: [place];  Service: Ophthalmology;  Laterality: Bilateral;  Diabetic - oral meds  . CATARACT EXTRACTION W/PHACO Left 03/22/2017   Procedure: CATARACT EXTRACTION PHACO AND INTRAOCULAR LENS PLACEMENT ([place]) left diabetic;  Surgeon: Eulogio Bear, MD;  Location: [place];  Service: Ophthalmology;  Laterality: Left;  Diabetic   . CATARACT EXTRACTION W/PHACO Right 05/17/2017   Procedure: CATARACT EXTRACTION PHACO AND INTRAOCULAR LENS PLACEMENT ([place]) Right diabetic;  Surgeon: Eulogio Bear, MD;  Location: [place];  Service: Ophthalmology;  Laterality: Right;  . CHOLECYSTECTOMY    . COLONOSCOPY WITH PROPOFOL N/A 11/14/2017   Procedure: COLONOSCOPY WITH PROPOFOL;  Surgeon: Lollie Sails, MD;  Location: [facility] ENDOSCOPY;  Service: Endoscopy;  Laterality: N/A;  . EYE SURGERY     cataracts with extraocular prosthesis  . KIDNEY SURGERY     gave daughter a kidney  . 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   Subjective Assessment - 11/19/20 1025    Subjective Pt. arrived to PT with no new complaints.  Pt. states she is planning to spend Thanksgiving with her daugher/ family .    Pertinent History Pt. currently retired.    How long can you walk comfortably? 15 mins    Patient Stated Goals Ride a bike (training wheels), walk to mailbox    Currently in Pain? No/denies            Scifit L4 8 min. B UE/EL (warm-up)- discussed HEP/ daily activity.     Neuro Re-ed:  Stairs: 5x up and down, use of bilateral rails, cueing to  stay upright with going down to decrease forward lean and to heel tap stair below to ensure heel clearance. Recip. Pattern with good control.  Standing bosu ball: step ups/ downs and wt. Shifting with mirror feedback.  CGA for safety/ cuing.     Resisted gait with 1BTB 5x all 4-planes in //-bars (CGA for cuing/ safety)  Walking in hallway with alt. UE/ LE touches and added head turns.  Difficulty with sequencing tasks and requires several breaks to regroup.    Rebounder: NBOS/ tandem stance with ball toss (small blue ball).    Walking outside in parking lot working on consistent cadence/ step pattern.  No LOB.         PT Long Term Goals - 11/05/20 1009      PT LONG TERM GOAL #1   Title Pt. will improve FOTO  score to 65 to improve pain free functional mobility.    Baseline 10/7: 57; 11/10: 53    Time 4    Period Weeks    Status On-going    Target Date 12/03/20      PT LONG TERM GOAL #2   Title Pt. will report no new falls/LOB at home over the next 4 weeks to improve safety and independence with complete ADLs at home.    Baseline 10/7: pt reports frequent LOB at home. 11/10: 3x/day of LOB at home. no falls    Time 4    Period Weeks    Status On-going    Target Date 12/03/20      PT LONG TERM GOAL #3   Title Pt. independent with HEP to improve gross LE strength by at least half a muslce grade to improve ability to complete ADLs independently.    Baseline 10/7: R/L out of 5; hip flex: 3+/3+, hip add 4+/4+, knee ext: 5/4+, knee flex: 5/4, ankle dorsiflex: 5/4+; 11/10: R/L out of 5; hip flex: 4/3+, hip add: 5/5, knee ext: 5/4+, knee flex: 5/4+ ankle dorisflex: 5/4    Time 4    Period Weeks    Status On-going    Target Date 12/03/20      PT LONG TERM GOAL #4   Title Pt. will report ability to walk from home to mailbox with no LOB and no increase in pain above 5/10 in L calf muslce to improve independence with ADLs.    Baseline 10/7: pt reports unalbe to walk to mailbox. 11/10: pt states she has been unable to walk to mailbox, pain is still high.    Time 4    Period Weeks    Status On-going    Target Date 12/03/20                 Plan - 11/19/20 1030    Clinical Impression Statement Pt. had difficulty with hallway activites, esp. alt. UE/LE touches.  Pt. able to complete BOSU and rebounder tasks with SBA/CGA for safety and cuing.  Occasional light UE assist to regain balance/ stability.  Pt. able to complele recip. stairs ascending/ descending with no issues.  Pt. will continue with current walking/ HEP.    Personal Factors and Comorbidities Time since onset of injury/illness/exacerbation    Examination-Activity Limitations Carry;Locomotion Level    Stability/Clinical Decision Making  Stable/Uncomplicated    Clinical Decision Making Low    Rehab Potential Good    PT Frequency 1x / week    PT Duration 4 weeks    PT Treatment/Interventions ADLs/Self Care Home Management;Cryotherapy;Gait training;Stair training;Functional mobility training;Therapeutic activities;Therapeutic  exercise;Balance training;Neuromuscular re-education;Patient/family education    PT Next Visit Plan Dynamic balance activities.  Check goals/ schedule    PT Home Exercise Plan [id number]    Consulted and Agree with Plan of Care Patient           Patient will benefit from skilled therapeutic intervention in order to improve the following deficits and impairments:  Abnormal gait, Decreased activity tolerance, Decreased balance, Decreased endurance, Decreased mobility, Decreased strength, Pain  Visit Diagnosis: Imbalance  Muscle weakness (generalized)  Other abnormalities of gait and mobility     Problem List Patient Active Problem List   Diagnosis Date Noted  . 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 Acute on chronic respiratory failure with hypoxia ([place]) 12/02/2018  . Acute respiratory failure with hypoxia ([place]) 12/02/2018  . Tick bite 06/20/2018  . Pessary maintenance 01/11/2018  . Cellulitis and abscess of right lower extremity 12/28/2017  . Vaginal polyp 12/12/2017  . Vaginal atrophy 12/12/2017  . Cystocele, midline 12/12/2017  . Mixed incontinence 03/23/2016   Pura Spice, PT, DPT # [phone] 11/19/2020, 10:36 AM  [facility] [address] [place], [place], [address] Phone: [phone]   Fax:  [phone]  Name: KAREEMAH GROUNDS MRN: [id number] Date of Birth: 1941/02/28

## 2020-11-26 ENCOUNTER — Ambulatory Visit: Payer: Medicare Other | Attending: Internal Medicine | Admitting: Physical Therapy

## 2020-11-26 ENCOUNTER — Other Ambulatory Visit: Payer: Self-pay

## 2020-11-26 ENCOUNTER — Encounter: Payer: Self-pay | Admitting: Physical Therapy

## 2020-11-26 DIAGNOSIS — R2689 Other abnormalities of gait and mobility: Secondary | ICD-10-CM | POA: Insufficient documentation

## 2020-11-26 DIAGNOSIS — M6281 Muscle weakness (generalized): Secondary | ICD-10-CM | POA: Diagnosis present

## 2020-11-26 NOTE — Therapy (Signed)
[facility] [address] [place], [place], [address] Phone: [phone]   Fax:  [phone]  Physical Therapy Treatment  Patient Details  Name: Candice Cook MRN: [id number] Date of Birth: [birth year] Referring Provider (PT): Mancel Bale, MD   Encounter Date: 11/26/2020   PT End of Session - 11/26/20 0539    Visit Number 5    Number of Visits 5    Date for PT Re-Evaluation 12/03/20    PT Start Time 1020    PT Stop Time 1109    PT Time Calculation (min) 49 min    Equipment Utilized During Treatment Gait belt    Activity Tolerance Patient tolerated treatment well    Behavior During Therapy [facility] for tasks assessed/performed           Past Medical History:  Diagnosis Date  . Anxiety and depression   . Arthritis   . Broken ankle   . Cancer ([place])    skin ca  . Depression   . Diabetes mellitus without complication ([place])   . GERD (gastroesophageal reflux disease)   . Gout   . Gout   . Heart murmur   . Hypercholesteremia   . Hypertension   . Mixed incontinence   . Pneumonia    Right lower lobe pneumonia  . Thyroid condition   . Wears dentures    full upper and lower    Past Surgical History:  Procedure Laterality Date  . ABDOMINAL HYSTERECTOMY    . APPENDECTOMY  1993  . BROW LIFT Bilateral 02/27/2019   Procedure: BLEPHAROPLASTY UPPER EYELID W/EXCESS SKIN;  Surgeon: Karle Starch, MD;  Location: [place];  Service: Ophthalmology;  Laterality: Bilateral;  . BROW PTOSIS Bilateral 02/27/2019   Procedure: BROW PTOSIS REPAIR;  Surgeon: Karle Starch, MD;  Location: [place];  Service: Ophthalmology;  Laterality: Bilateral;  Diabetic - oral meds  . CATARACT EXTRACTION W/PHACO Left 03/22/2017   Procedure: CATARACT EXTRACTION PHACO AND INTRAOCULAR LENS PLACEMENT ([place]) left diabetic;  Surgeon: Eulogio Bear, MD;  Location: [place];  Service: Ophthalmology;  Laterality: Left;  Diabetic  .  CATARACT EXTRACTION W/PHACO Right 05/17/2017   Procedure: CATARACT EXTRACTION PHACO AND INTRAOCULAR LENS PLACEMENT ([place]) Right diabetic;  Surgeon: Eulogio Bear, MD;  Location: [place];  Service: Ophthalmology;  Laterality: Right;  . CHOLECYSTECTOMY    . COLONOSCOPY WITH PROPOFOL N/A 11/14/2017   Procedure: COLONOSCOPY WITH PROPOFOL;  Surgeon: Lollie Sails, MD;  Location: [facility] ENDOSCOPY;  Service: Endoscopy;  Laterality: N/A;  . EYE SURGERY     cataracts with extraocular prosthesis  . KIDNEY SURGERY     gave daughter a kidney  . 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   Subjective Assessment - 11/26/20 1238    Subjective Pt. entered PT with no new complaints.  Pt. reports no loss of balance over the Thanksgiving weekend.    Pertinent History Pt. currently retired.    How long can you walk comfortably? 15 mins    Patient Stated Goals Ride a bike (training wheels), walk to mailbox    Currently in Pain? No/denies            There.ex.:  No charge  Scifit L2-4 10 min. B UE/EL (warm-up)- discussed HEP/ daily activity.  L quad fatigue/ discomfort   Neuro Re-ed:    Resisted gait with 1BTB 5x  all 4-planes in //-bars (CGA for cuing/ safety)  Walking in hallway with alt. UE/ LE touches and added head turns.  Improvement noted with sequencing tasks as compared to last tx. Session.   Rebounder: NBOS/ tandem stance/ Airex with ball toss (small blue ball).    Agility ladder: walking over green hurdles 6x (recip. Pattern).  Walking cone taps with mod. Cuing for proper posture/ foot placement (1 LOB with self-correction).    Tandem Airex forward/ 3" plinth step ups and overs.        PT Long Term Goals - 11/05/20 1009      PT LONG TERM GOAL #1   Title Pt. will improve FOTO score to 65 to improve pain free functional mobility.    Baseline 10/7: 57; 11/10: 53    Time 4    Period Weeks    Status On-going     Target Date 12/03/20      PT LONG TERM GOAL #2   Title Pt. will report no new falls/LOB at home over the next 4 weeks to improve safety and independence with complete ADLs at home.    Baseline 10/7: pt reports frequent LOB at home. 11/10: 3x/day of LOB at home. no falls    Time 4    Period Weeks    Status On-going    Target Date 12/03/20      PT LONG TERM GOAL #3   Title Pt. independent with HEP to improve gross LE strength by at least half a muslce grade to improve ability to complete ADLs independently.    Baseline 10/7: R/L out of 5; hip flex: 3+/3+, hip add 4+/4+, knee ext: 5/4+, knee flex: 5/4, ankle dorsiflex: 5/4+; 11/10: R/L out of 5; hip flex: 4/3+, hip add: 5/5, knee ext: 5/4+, knee flex: 5/4+ ankle dorisflex: 5/4    Time 4    Period Weeks    Status On-going    Target Date 12/03/20      PT LONG TERM GOAL #4   Title Pt. will report ability to walk from home to mailbox with no LOB and no increase in pain above 5/10 in L calf muslce to improve independence with ADLs.    Baseline 10/7: pt reports unalbe to walk to mailbox. 11/10: pt states she has been unable to walk to mailbox, pain is still high.    Time 4    Period Weeks    Status On-going    Target Date 12/03/20               Plan - 11/26/20 1242    Clinical Impression Statement Pt. requires practive/ extra time during cone taps at agility ladder to improve consistent recip. pattern/ balance.  Pt. reports L quad discomfort/ fatigue during Scifit which was noted during L LE wt. bearing with dynamic balance tasks.  Improve balance with R LE as compared to LE with hallway activites.  Pt. continues to impress with balance tasks on Airex and in hallway.  Improved sequencing with alt. UE/LE touches while maintaining balance/ upright posture.    Personal Factors and Comorbidities Time since onset of injury/illness/exacerbation    Examination-Activity Limitations Carry;Locomotion Level    Stability/Clinical Decision Making  Stable/Uncomplicated    Clinical Decision Making Low    Rehab Potential Good    PT Frequency 1x / week    PT Duration 4 weeks    PT Treatment/Interventions ADLs/Self Care Home Management;Cryotherapy;Gait training;Stair training;Functional mobility training;Therapeutic activities;Therapeutic exercise;Balance training;Neuromuscular re-education;Patient/family education    PT  Next Visit Plan Dynamic balance activities.  Check goals/ schedule    PT Home Exercise Plan [id number]    Consulted and Agree with Plan of Care Patient           Patient will benefit from skilled therapeutic intervention in order to improve the following deficits and impairments:  Abnormal gait, Decreased activity tolerance, Decreased balance, Decreased endurance, Decreased mobility, Decreased strength, Pain  Visit Diagnosis: Imbalance  Muscle weakness (generalized)  Other abnormalities of gait and mobility     Problem List Patient Active Problem List   Diagnosis Date Noted  . 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 Acute on chronic respiratory failure with hypoxia ([place]) 12/02/2018  . Acute respiratory failure with hypoxia ([place]) 12/02/2018  . Tick bite 06/20/2018  . Pessary maintenance 01/11/2018  . Cellulitis and abscess of right lower extremity 12/28/2017  . Vaginal polyp 12/12/2017  . Vaginal atrophy 12/12/2017  . Cystocele, midline 12/12/2017  . Mixed incontinence 03/23/2016   Pura Spice, PT, DPT # [phone] 11/26/2020, 12:47 PM  [facility] [address] [place], [place], [address] Phone: [phone]   Fax:  [phone]  Name: Candice Cook MRN: [id number] Date of Birth: [birth year]

## 2020-12-03 ENCOUNTER — Encounter: Payer: Self-pay | Admitting: Physical Therapy

## 2020-12-03 ENCOUNTER — Ambulatory Visit: Payer: Medicare Other | Admitting: Physical Therapy

## 2020-12-03 ENCOUNTER — Other Ambulatory Visit: Payer: Self-pay

## 2020-12-03 DIAGNOSIS — M6281 Muscle weakness (generalized): Secondary | ICD-10-CM

## 2020-12-03 DIAGNOSIS — R2689 Other abnormalities of gait and mobility: Secondary | ICD-10-CM

## 2020-12-03 NOTE — Therapy (Signed)
[facility] [address] [place], [place], [address] Phone: [phone]   Fax:  [phone]  Physical Therapy Treatment  Patient Details  Name: Candice Cook MRN: [id number] Date of Birth: 09/14/1941 Referring Provider (PT): Mancel Bale, MD   Encounter Date: 12/03/2020   PT End of Session - 12/03/20 1033    Visit Number 6    Number of Visits 10    Date for PT Re-Evaluation 12/31/20    PT Start Time 1025    PT Stop Time 1118    PT Time Calculation (min) 53 min    Equipment Utilized During Treatment Gait belt    Activity Tolerance Patient tolerated treatment well    Behavior During Therapy [facility] for tasks assessed/performed           Past Medical History:  Diagnosis Date  . Anxiety and depression   . Arthritis   . Broken ankle   . Cancer ([place])    skin ca  . Depression   . Diabetes mellitus without complication ([place])   . GERD (gastroesophageal reflux disease)   . Gout   . Gout   . Heart murmur   . Hypercholesteremia   . Hypertension   . Mixed incontinence   . Pneumonia    Right lower lobe pneumonia  . Thyroid condition   . Wears dentures    full upper and lower    Past Surgical History:  Procedure Laterality Date  . ABDOMINAL HYSTERECTOMY    . APPENDECTOMY  1993  . BROW LIFT Bilateral 02/27/2019   Procedure: BLEPHAROPLASTY UPPER EYELID W/EXCESS SKIN;  Surgeon: Karle Starch, MD;  Location: [place];  Service: Ophthalmology;  Laterality: Bilateral;  . BROW PTOSIS Bilateral 02/27/2019   Procedure: BROW PTOSIS REPAIR;  Surgeon: Karle Starch, MD;  Location: [place];  Service: Ophthalmology;  Laterality: Bilateral;  Diabetic - oral meds  . CATARACT EXTRACTION W/PHACO Left 03/22/2017   Procedure: CATARACT EXTRACTION PHACO AND INTRAOCULAR LENS PLACEMENT ([place]) left diabetic;  Surgeon: Eulogio Bear, MD;  Location: [place];  Service: Ophthalmology;  Laterality: Left;  Diabetic   . CATARACT EXTRACTION W/PHACO Right 05/17/2017   Procedure: CATARACT EXTRACTION PHACO AND INTRAOCULAR LENS PLACEMENT ([place]) Right diabetic;  Surgeon: Eulogio Bear, MD;  Location: [place];  Service: Ophthalmology;  Laterality: Right;  . CHOLECYSTECTOMY    . COLONOSCOPY WITH PROPOFOL N/A 11/14/2017   Procedure: COLONOSCOPY WITH PROPOFOL;  Surgeon: Lollie Sails, MD;  [facility] END[facility]COPY;  Service: Endoscopy;  Laterality: N/A;  . EYE SURGERY     cataracts with extraocular prosthesis  . KIDNEY SURGERY     gave daughter a kidney  . 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   Subjective Assessment - 12/03/20 1032    Subjective Pt. states she is doing well.  No new complaints.  No falls.    Pertinent History Pt. currently retired.    How long can you walk comfortably? 15 mins    Patient Stated Goals Ride a bike (training wheels), walk to mailbox    Currently in Pain? No/denies              [facility] PT Assessment - 12/03/20 0001      Assessment   Medical Diagnosis Gait disorder    Referring Provider (PT) Mancel Bale, MD    Onset Date/Surgical Date 12/28/19  [place] residence      Prior Function   Level of Independence Independent            There.ex.:    Nustep L2 10 min. B UE/LE (warm-up)- discuss activity over with weekend.    Seated/standing hip ex.: 4# LAQ/ marching/ lateral walking/ heel raises)- 20x each.     Neuro Re-ed:  Resisted gait with 1BTB 5x all 4-planes in //-bars(CGA for cuing/ safety)  Walking over green hurdles 6x (recip. Pattern).  Added high hurdles/ Airex/ 6" step   Walking in hallway with alt. UE/ LE touches and added head turns. Improvement noted with sequencing tasks as compared to last tx. Session.  Pt. Able to self-correct independently.   Tandem Airex forward/ 3" plinth step ups and overs.   6" step ups  on R/L with no UE assist.       PT Long Term Goals - 12/03/20 2002      PT LONG TERM GOAL #1   Title Pt. will improve FOTO score to 65 to improve pain free functional mobility.    Baseline 10/7: 57; 11/10: 53    Time 4    Period Weeks    Status On-going    Target Date 12/31/20      PT LONG TERM GOAL #2   Title Pt. will report no new falls/LOB at home over the next 4 weeks to improve safety and independence with complete ADLs at home.    Baseline 10/7: pt reports frequent LOB at home. 11/10: 3x/day of LOB at home. no falls    Time 4    Period Weeks    Status Partially Met    Target Date 12/31/20      PT LONG TERM GOAL #3   Title Pt. independent with HEP to improve gross LE strength by at least half a muslce grade to improve ability to complete ADLs independently.    Baseline 10/7: R/L out of 5; hip flex: 3+/3+, hip add 4+/4+, knee ext: 5/4+, knee flex: 5/4, ankle dorsiflex: 5/4+; 11/10: R/L out of 5; hip flex: 4/3+, hip add: 5/5, knee ext: 5/4+, knee flex: 5/4+ ankle dorisflex: 5/4    Time 4    Period Weeks    Status On-going    Target Date 12/31/20      PT LONG TERM GOAL #4   Title Pt. will report ability to walk from home to mailbox with no LOB and no increase in pain above 5/10 in L calf muslce to improve independence with ADLs.    Baseline 10/7: pt reports unalbe to walk to mailbox. 11/10: pt states she has been unable to walk to mailbox, pain is still high.    Time 4    Period Weeks    Status Partially Met    Target Date 12/31/20              Plan - 12/03/20 1034    Clinical Impression Statement Pt. had 2 episodes of scissoring gait in hallway while completing head turns and able to self-correct independently.  Pt. works hard during tx. session and during resisted ther.ex.  Pt. has difficulty with alt. UE and LE touches in hallway but improving with practice.  Good hip flexion/ step overs in //-bars during hurdles, addition of high hurdles.  L hip flexor weakness as  compared to R during seated/standing ex.    Personal Factors and Comorbidities Time since onset of injury/illness/exacerbation    Examination-Activity Limitations  Carry;Locomotion Level    Stability/Clinical Decision Making Stable/Uncomplicated    Clinical Decision Making Low    Rehab Potential Good    PT Frequency 1x / week    PT Duration 4 weeks    PT Treatment/Interventions ADLs/Self Care Home Management;Cryotherapy;Gait training;Stair training;Functional mobility training;Therapeutic activities;Therapeutic exercise;Balance training;Neuromuscular re-education;Patient/family education    PT Next Visit Plan Dynamic balance activities.    PT Home Exercise Plan [id number]    Consulted and Agree with Plan of Care Patient           Patient will benefit from skilled therapeutic intervention in order to improve the following deficits and impairments:  Abnormal gait, Decreased activity tolerance, Decreased balance, Decreased endurance, Decreased mobility, Decreased strength, Pain  Visit Diagnosis: Imbalance  Muscle weakness (generalized)  Other abnormalities of gait and mobility     Problem List Patient Active Problem List   Diagnosis Date Noted  . 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 Acute on chronic respiratory failure with hypoxia ([place]) 12/02/2018  . Acute respiratory failure with hypoxia ([place]) 12/02/2018  . Tick bite 06/20/2018  . Pessary maintenance 01/11/2018  . Cellulitis and abscess of right lower extremity 12/28/2017  . Vaginal polyp 12/12/2017  . Vaginal atrophy 12/12/2017  . Cystocele, midline 12/12/2017  . Mixed incontinence 03/23/2016   Pura Spice, PT, DPT # [id number] 12/03/2020, 8:04 PM  [place] [facility] [address] [place], [place], [address] Phone: [phone]   Fax:  [phone]  Name: Candice Cook MRN: [id number] Date of Birth: [birth year]

## 2020-12-10 ENCOUNTER — Ambulatory Visit: Payer: Medicare Other | Admitting: Physical Therapy

## 2020-12-10 ENCOUNTER — Other Ambulatory Visit: Payer: Self-pay

## 2020-12-10 ENCOUNTER — Encounter: Payer: Self-pay | Admitting: Physical Therapy

## 2020-12-10 DIAGNOSIS — R2689 Other abnormalities of gait and mobility: Secondary | ICD-10-CM

## 2020-12-10 DIAGNOSIS — M6281 Muscle weakness (generalized): Secondary | ICD-10-CM

## 2020-12-10 NOTE — Therapy (Signed)
[facility] [address] [place], [place], [address] Phone: [phone]   Fax:  [phone]  Physical Therapy Treatment  Patient Details  Name: Candice Cook MRN: [id number] Date of Birth: July 24, 1941 Referring Provider (PT): Mancel Bale, MD   Encounter Date: 12/10/2020   PT End of Session - 12/10/20 1022    Visit Number 7    Number of Visits 10    Date for PT Re-Evaluation 12/31/20    PT Start Time 1022    PT Stop Time 1111    PT Time Calculation (min) 49 min    Equipment Utilized During Treatment Gait belt    Activity Tolerance Patient tolerated treatment well    Behavior During Therapy [facility] for tasks assessed/performed           Past Medical History:  Diagnosis Date  . Anxiety and depression   . Arthritis   . Broken ankle   . Cancer ([place])    skin ca  . Depression   . Diabetes mellitus without complication ([place])   . GERD (gastroesophageal reflux disease)   . Gout   . Gout   . Heart murmur   . Hypercholesteremia   . Hypertension   . Mixed incontinence   . Pneumonia    Right lower lobe pneumonia  . Thyroid condition   . Wears dentures    full upper and lower    Past Surgical History:  Procedure Laterality Date  . ABDOMINAL HYSTERECTOMY    . APPENDECTOMY  1993  . BROW LIFT Bilateral 02/27/2019   Procedure: BLEPHAROPLASTY UPPER EYELID W/EXCESS SKIN;  Surgeon: Karle Starch, MD;  Location: [place];  Service: Ophthalmology;  Laterality: Bilateral;  . BROW PTOSIS Bilateral 02/27/2019   Procedure: BROW PTOSIS REPAIR;  Surgeon: Karle Starch, MD;  Location: [place];  Service: Ophthalmology;  Laterality: Bilateral;  Diabetic - oral meds  . CATARACT EXTRACTION W/PHACO Left 03/22/2017   Procedure: CATARACT EXTRACTION PHACO AND INTRAOCULAR LENS PLACEMENT ([place]) left diabetic;  Surgeon: Eulogio Bear, MD;  Location: [place];  Service: Ophthalmology;  Laterality: Left;  Diabetic   . CATARACT EXTRACTION W/PHACO Right 05/17/2017   Procedure: CATARACT EXTRACTION PHACO AND INTRAOCULAR LENS PLACEMENT ([place]) Right diabetic;  Surgeon: Eulogio Bear, MD;  Location: [place];  Service: Ophthalmology;  Laterality: Right;  . CHOLECYSTECTOMY    . COLONOSCOPY WITH PROPOFOL N/A 11/14/2017   Procedure: COLONOSCOPY WITH PROPOFOL;  Surgeon: Lollie Sails, MD;  Location: [facility] ENDOSCOPY;  Service: Endoscopy;  Laterality: N/A;  . EYE SURGERY     cataracts with extraocular prosthesis  . KIDNEY SURGERY     gave daughter a kidney  . 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   Subjective Assessment - 12/10/20 1022    Subjective Pt. entered PT while demonstrating alt. UE/LE touches with no mistakes.  Pt. states she has remained active and reports no falls.    Pertinent History Pt. currently retired.    How long can you walk comfortably? 15 mins    Patient Stated Goals Ride a bike (training wheels), walk to mailbox    Currently in Pain? No/denies              There.ex.:   Nustep L3 48mn. B UE/LE (warm-up)- discuss activity over with weekend.    Seated/standing hip ex.: 4# LAQ/ marching/ lateral walking/ heel raises/  hip extension/ knee flexion)- 20x each.    Step ups/ downs (light to no UE assist)   Neuro Re-ed:  Walking in hallway with alt. UE/ LE touches and turns.  Walking with varying cadences/ cuing for proper posture/ arm swing.   Walking cone taps (recip. Pattern) in //-bars with no UE assist.  Airex step ups/ EC/ tandem stance (>10 sec.)    Walking outside on sidewalk/ ramp  Reviewed HEP      PT Long Term Goals - 12/03/20 2002      PT LONG TERM GOAL #1   Title Pt. will improve FOTO score to 65 to improve pain free functional mobility.    Baseline 10/7: 57; 11/10: 53    Time 4    Period Weeks    Status On-going    Target Date 12/31/20      PT LONG TERM GOAL #2    Title Pt. will report no new falls/LOB at home over the next 4 weeks to improve safety and independence with complete ADLs at home.    Baseline 10/7: pt reports frequent LOB at home. 11/10: 3x/day of LOB at home. no falls    Time 4    Period Weeks    Status Partially Met    Target Date 12/31/20      PT LONG TERM GOAL #3   Title Pt. independent with HEP to improve gross LE strength by at least half a muslce grade to improve ability to complete ADLs independently.    Baseline 10/7: R/L out of 5; hip flex: 3+/3+, hip add 4+/4+, knee ext: 5/4+, knee flex: 5/4, ankle dorsiflex: 5/4+; 11/10: R/L out of 5; hip flex: 4/3+, hip add: 5/5, knee ext: 5/4+, knee flex: 5/4+ ankle dorisflex: 5/4    Time 4    Period Weeks    Status On-going    Target Date 12/31/20      PT LONG TERM GOAL #4   Title Pt. will report ability to walk from home to mailbox with no LOB and no increase in pain above 5/10 in L calf muslce to improve independence with ADLs.    Baseline 10/7: pt reports unalbe to walk to mailbox. 11/10: pt states she has been unable to walk to mailbox, pain is still high.    Time 4    Period Weeks    Status Partially Met    Target Date 12/31/20                 Plan - 12/10/20 1022    Clinical Impression Statement Pt. progressing well with LE resisted ther.ex. with L hip flexor weakness noted as compared to R.  Pt. has increase fatigue no L LE as compared to R.  Pt. ambulates around PT clinic with consistent gait pattern with 1 episode of lateral misstep while walking in hallway but pt. able to self-correct. Good overall muscle endurance during tx. with few seated rest breaks.    Personal Factors and Comorbidities Time since onset of injury/illness/exacerbation    Examination-Activity Limitations Carry;Locomotion Level    Stability/Clinical Decision Making Stable/Uncomplicated    Clinical Decision Making Low    Rehab Potential Good    PT Frequency 1x / week    PT Duration 4 weeks    PT  Treatment/Interventions ADLs/Self Care Home Management;Cryotherapy;Gait training;Stair training;Functional mobility training;Therapeutic activities;Therapeutic exercise;Balance training;Neuromuscular re-education;Patient/family education    PT Next Visit Plan Dynamic balance activities.    PT Home Exercise Plan [id number]    Consulted and  Agree with Plan of Care Patient           Patient will benefit from skilled therapeutic intervention in order to improve the following deficits and impairments:  Abnormal gait,Decreased activity tolerance,Decreased balance,Decreased endurance,Decreased mobility,Decreased strength,Pain  Visit Diagnosis: Imbalance  Muscle weakness (generalized)  Other abnormalities of gait and mobility     Problem List Patient Active Problem List   Diagnosis Date Noted  . 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 Acute on chronic respiratory failure with hypoxia ([place]) 12/02/2018  . Acute respiratory failure with hypoxia ([place]) 12/02/2018  . Tick bite 06/20/2018  . Pessary maintenance 01/11/2018  . Cellulitis and abscess of right lower extremity 12/28/2017  . Vaginal polyp 12/12/2017  . Vaginal atrophy 12/12/2017  . Cystocele, midline 12/12/2017  . Mixed incontinence 03/23/2016   Pura Spice, PT, DPT # [phone] 12/10/2020, 8:28 PM  [facility] [address] [place], [place], [address] Phone: [phone]   Fax:  [phone]  Name: Candice Cook MRN: [id number] Date of Birth: Dec 04, 1941

## 2020-12-17 ENCOUNTER — Ambulatory Visit: Payer: Medicare Other | Admitting: Physical Therapy

## 2020-12-17 ENCOUNTER — Other Ambulatory Visit: Payer: Self-pay

## 2020-12-17 ENCOUNTER — Encounter: Payer: Self-pay | Admitting: Physical Therapy

## 2020-12-17 DIAGNOSIS — M6281 Muscle weakness (generalized): Secondary | ICD-10-CM

## 2020-12-17 DIAGNOSIS — R2689 Other abnormalities of gait and mobility: Secondary | ICD-10-CM | POA: Diagnosis not present

## 2020-12-17 NOTE — Therapy (Signed)
[place] [address] [place], [place], [address] Phone: [phone]   Fax:  [phone]  Physical Therapy Treatment  Patient Details  Name: Candice Cook MRN: [id number] Date of Birth: 07/03/1941 Referring Provider (PT): Mancel Bale, MD   Encounter Date: 12/17/2020   PT End of Session - 12/17/20 1022    Visit Number 8    Number of Visits 10    Date for PT Re-Evaluation 12/31/20    PT Start Time 1245    PT Stop Time 1110    PT Time Calculation (min) 47 min    Equipment Utilized During Treatment Gait belt    Activity Tolerance Patient tolerated treatment well    Behavior During Therapy [facility] for tasks assessed/performed           Past Medical History:  Diagnosis Date  . Anxiety and depression   . Arthritis   . Broken ankle   . Cancer ([place])    skin ca  . Depression   . Diabetes mellitus without complication ([place])   . GERD (gastroesophageal reflux disease)   . Gout   . Gout   . Heart murmur   . Hypercholesteremia   . Hypertension   . Mixed incontinence   . Pneumonia    Right lower lobe pneumonia  . Thyroid condition   . Wears dentures    full upper and lower    Past Surgical History:  Procedure Laterality Date  . ABDOMINAL HYSTERECTOMY    . APPENDECTOMY  1993  . BROW LIFT Bilateral 02/27/2019   Procedure: BLEPHAROPLASTY UPPER EYELID W/EXCESS SKIN;  Surgeon: Karle Starch, MD;  Location: [place];  Service: Ophthalmology;  Laterality: Bilateral;  . BROW PTOSIS Bilateral 02/27/2019   Procedure: BROW PTOSIS REPAIR;  Surgeon: Karle Starch, MD;  Location: [place];  Service: Ophthalmology;  Laterality: Bilateral;  Diabetic - oral meds  . CATARACT EXTRACTION W/PHACO Left 03/22/2017   Procedure: CATARACT EXTRACTION PHACO AND INTRAOCULAR LENS PLACEMENT ([place]) left diabetic;  Surgeon: Eulogio Bear, MD;  Location: [place];  Service: Ophthalmology;  Laterality: Left;  Diabetic   . CATARACT EXTRACTION W/PHACO Right 05/17/2017   Procedure: CATARACT EXTRACTION PHACO AND INTRAOCULAR LENS PLACEMENT ([place]) Right diabetic;  Surgeon: Eulogio Bear, MD;  Location: [place];  Service: Ophthalmology;  Laterality: Right;  . CHOLECYSTECTOMY    . COLONOSCOPY WITH PROPOFOL N/A 11/14/2017   Procedure: COLONOSCOPY WITH PROPOFOL;  Surgeon: Lollie Sails, MD;  Location: [facility] ENDOSCOPY;  Service: Endoscopy;  Laterality: N/A;  . EYE SURGERY     cataracts with extraocular prosthesis  . KIDNEY SURGERY     gave daughter a kidney  . 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   Subjective Assessment - 12/17/20 1233    Subjective Pt. entered PT with no new complaints.    Pertinent History Pt. currently retired.    How long can you walk comfortably? 15 mins    Patient Stated Goals Ride a bike (training wheels), walk to mailbox    Currently in Pain? No/denies             There.ex.:   Nustep L433mn. B UE/LE(warm-up).   Seated/standing hip ex.: 3# LAQ/ marching/ lateral walking/ heel raises/ hip extension/ knee flexion)- 20x each.  Step ups/ downs at stairs.  Recip. Stair climbing 4 steps x 3.  No UE assist.  Neuro Re-ed:  Walking in hallway with posture correction/ proper gait pattern (increase L LE fatigue)  Tandem stance/ gait in //-bars (forward/ backwards)- mirror feedback.  BOSU step ups/ downs (SBA/CGA for safety)- in //-bars.    Standing 2nd step touches with no UE assist 20x.      PT Long Term Goals - 12/03/20 2002      PT LONG TERM GOAL #1   Title Pt. will improve FOTO score to 65 to improve pain free functional mobility.    Baseline 10/7: 57; 11/10: 53    Time 4    Period Weeks    Status On-going    Target Date 12/31/20      PT LONG TERM GOAL #2   Title Pt. will report no new falls/LOB at home over the next 4 weeks to improve safety and independence with  complete ADLs at home.    Baseline 10/7: pt reports frequent LOB at home. 11/10: 3x/day of LOB at home. no falls    Time 4    Period Weeks    Status Partially Met    Target Date 12/31/20      PT LONG TERM GOAL #3   Title Pt. independent with HEP to improve gross LE strength by at least half a muslce grade to improve ability to complete ADLs independently.    Baseline 10/7: R/L out of 5; hip flex: 3+/3+, hip add 4+/4+, knee ext: 5/4+, knee flex: 5/4, ankle dorsiflex: 5/4+; 11/10: R/L out of 5; hip flex: 4/3+, hip add: 5/5, knee ext: 5/4+, knee flex: 5/4+ ankle dorisflex: 5/4    Time 4    Period Weeks    Status On-going    Target Date 12/31/20      PT LONG TERM GOAL #4   Title Pt. will report ability to walk from home to mailbox with no LOB and no increase in pain above 5/10 in L calf muslce to improve independence with ADLs.    Baseline 10/7: pt reports unalbe to walk to mailbox. 11/10: pt states she has been unable to walk to mailbox, pain is still high.    Time 4    Period Weeks    Status Partially Met    Target Date 12/31/20                 Plan - 12/17/20 1235    Clinical Impression Statement Pt. worked hard during tx. session.  Pt. had minimal difficulty with tandem gait initially and improvement noted with multiple attempts.  Pt. able to complete >20 sec. tandem stance (L in front of R).  Pt. requires SBA/CGA with BOSU step ups/downs in //-bars.  Pt. ascends/ descends stairs with recip. pattern and good balance.  Increase L LE fatigue noted at end of tx. session.  Pt. will focus on L LE strengthening HEP.    Personal Factors and Comorbidities Time since onset of injury/illness/exacerbation    Examination-Activity Limitations Carry;Locomotion Level    Stability/Clinical Decision Making Stable/Uncomplicated    Clinical Decision Making Low    Rehab Potential Good    PT Frequency 1x / week    PT Duration 4 weeks    PT Treatment/Interventions ADLs/Self Care Home  Management;Cryotherapy;Gait training;Stair training;Functional mobility training;Therapeutic activities;Therapeutic exercise;Balance training;Neuromuscular re-education;Patient/family education    PT Next Visit Plan Dynamic balance activities.  CHECK GOALS/  D/c vs. RECERT next tx. session.    PT Home Exercise Plan [id number]    Consulted and Agree with Plan of Care Patient  Patient will benefit from skilled therapeutic intervention in order to improve the following deficits and impairments:  Abnormal gait,Decreased activity tolerance,Decreased balance,Decreased endurance,Decreased mobility,Decreased strength,Pain  Visit Diagnosis: Imbalance  Muscle weakness (generalized)  Other abnormalities of gait and mobility     Problem List Patient Active Problem List   Diagnosis Date Noted  . 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 Acute on chronic respiratory failure with hypoxia ([place]) 12/02/2018  . Acute respiratory failure with hypoxia ([place]) 12/02/2018  . Tick bite 06/20/2018  . Pessary maintenance 01/11/2018  . Cellulitis and abscess of right lower extremity 12/28/2017  . Vaginal polyp 12/12/2017  . Vaginal atrophy 12/12/2017  . Cystocele, midline 12/12/2017  . Mixed incontinence 03/23/2016   Pura Spice, PT, DPT # [id number] 12/17/2020, 12:43 PM  [place] [facility] [address] [place], [place], [address] Phone: [phone]   Fax:  [phone]  Name: TANDREA KOMMER MRN: [id number] Date of Birth: 02-Mar-1941

## 2020-12-24 ENCOUNTER — Ambulatory Visit: Payer: Medicare Other | Admitting: Physical Therapy

## 2020-12-24 ENCOUNTER — Encounter: Payer: Self-pay | Admitting: Physical Therapy

## 2020-12-24 DIAGNOSIS — M6281 Muscle weakness (generalized): Secondary | ICD-10-CM

## 2020-12-24 DIAGNOSIS — R2689 Other abnormalities of gait and mobility: Secondary | ICD-10-CM | POA: Diagnosis not present

## 2020-12-24 NOTE — Therapy (Signed)
[place] [facility] [address] [place], [place], [address] Phone: [phone]   Fax:  [phone]  Physical Therapy Treatment  Patient Details  Name: Candice Cook MRN: [id number] Date of Birth: 03-Jun-1941 Referring Provider (PT): Mancel Bale, MD   Encounter Date: 12/24/2020   PT End of Session - 12/24/20 1749    Visit Number 9    Number of Visits 10    Date for PT Re-Evaluation 12/31/20    PT Start Time 1025    PT Stop Time 1116    PT Time Calculation (min) 51 min    Equipment Utilized During Treatment Gait belt    Activity Tolerance Patient tolerated treatment well    Behavior During Therapy WFL for tasks assessed/performed           Past Medical History:  Diagnosis Date   Anxiety and depression    Arthritis    Broken ankle    Cancer ([place])    skin ca   Depression    Diabetes mellitus without complication ([place])    GERD (gastroesophageal reflux disease)    Gout    Gout    Heart murmur    Hypercholesteremia    Hypertension    Mixed incontinence    Pneumonia    Right lower lobe pneumonia   Thyroid condition    Wears dentures    full upper and lower    Past Surgical History:  Procedure Laterality Date   ABDOMINAL HYSTERECTOMY     APPENDECTOMY  1993   BROW LIFT Bilateral 02/27/2019   Procedure: BLEPHAROPLASTY UPPER EYELID W/EXCESS SKIN;  Surgeon: Karle Starch, MD;  Location: [place];  Service: Ophthalmology;  Laterality: Bilateral;   BROW PTOSIS Bilateral 02/27/2019   Procedure: BROW PTOSIS REPAIR;  Surgeon: Karle Starch, MD;  Location: [place];  Service: Ophthalmology;  Laterality: Bilateral;  Diabetic - oral meds   CATARACT EXTRACTION W/PHACO Left 03/22/2017   Procedure: CATARACT EXTRACTION PHACO AND INTRAOCULAR LENS PLACEMENT ([place]) left diabetic;  Surgeon: Eulogio Bear, MD;  Location: [place];  Service: Ophthalmology;  Laterality: Left;  Diabetic    CATARACT EXTRACTION W/PHACO Right 05/17/2017   Procedure: CATARACT EXTRACTION PHACO AND INTRAOCULAR LENS PLACEMENT ([place]) Right diabetic;  Surgeon: Eulogio Bear, MD;  Location: [place];  Service: Ophthalmology;  Laterality: Right;   CHOLECYSTECTOMY     COLONOSCOPY WITH PROPOFOL N/A 11/14/2017   Procedure: COLONOSCOPY WITH PROPOFOL;  Surgeon: Lollie Sails, MD;  Location: [facility] ENDOSCOPY;  Service: Endoscopy;  Laterality: N/A;   EYE SURGERY     cataracts with extraocular prosthesis   KIDNEY SURGERY     gave daughter a kidney   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   Subjective Assessment - 12/24/20 1748    Subjective Pt. states she had a nice Christmas weekend and no falls reported.  Pt. had difficulty with car battery this morning.    Pertinent History Pt. currently retired.    How long can you walk comfortably? 15 mins    Patient Stated Goals Ride a bike (training wheels), walk to mailbox    Currently in Pain? No/denies              There.ex.:   Nustep L5-453mn. B UE/LE(warm-up). Pt. Reports L quad discomfort after 3 min. And PT decreased intensity to L4.  No pain reported after Nustep.  Seated/standing hip ex.: 3# LAQ/ marching/ lateral walking/ heel raises/ hip extension/ knee flexion)- 20x each.   Neuro Re-ed:  Walking in hallway with posture correction/ proper gait pattern (increase cadence today).  Sit to stands in front of mirror on Airex 10x.  Standing Airex cone taps (no UE assist) and rebounder-  SBA/CGA for safety.   Walking outside on grass/ up and down curbs with L LE and no UE assist.  No LOB during tx. Session.        PT Long Term Goals - 12/03/20 2002      PT LONG TERM GOAL #1   Title Pt. will improve FOTO score to 65 to improve pain free functional mobility.    Baseline 10/7: 57; 11/10: 53    Time 4    Period Weeks    Status On-going     Target Date 12/31/20      PT LONG TERM GOAL #2   Title Pt. will report no new falls/LOB at home over the next 4 weeks to improve safety and independence with complete ADLs at home.    Baseline 10/7: pt reports frequent LOB at home. 11/10: 3x/day of LOB at home. no falls    Time 4    Period Weeks    Status Partially Met    Target Date 12/31/20      PT LONG TERM GOAL #3   Title Pt. independent with HEP to improve gross LE strength by at least half a muslce grade to improve ability to complete ADLs independently.    Baseline 10/7: R/L out of 5; hip flex: 3+/3+, hip add 4+/4+, knee ext: 5/4+, knee flex: 5/4, ankle dorsiflex: 5/4+; 11/10: R/L out of 5; hip flex: 4/3+, hip add: 5/5, knee ext: 5/4+, knee flex: 5/4+ ankle dorisflex: 5/4    Time 4    Period Weeks    Status On-going    Target Date 12/31/20      PT LONG TERM GOAL #4   Title Pt. will report ability to walk from home to mailbox with no LOB and no increase in pain above 5/10 in L calf muslce to improve independence with ADLs.    Baseline 10/7: pt reports unalbe to walk to mailbox. 11/10: pt states she has been unable to walk to mailbox, pain is still high.    Time 4    Period Weeks    Status Partially Met    Target Date 12/31/20                 Plan - 12/24/20 1749    Clinical Impression Statement Pt. challenged by Airex ex. with added rebounder toss/ cone taps.  SBA/CGA required for safety to complete higher level dynamic tasks and pt. was able to step up/down curb with L LE and no UE assist.  Pt. progressing well towards goals and 1 more tx. session scheduled next week.  PT will discuss POC.    Personal Factors and Comorbidities Time since onset of injury/illness/exacerbation    Examination-Activity Limitations Carry;Locomotion Level    Stability/Clinical Decision Making Stable/Uncomplicated    Clinical Decision Making Low    Rehab Potential Good    PT Frequency 1x / week    PT Duration 4 weeks    PT  Treatment/Interventions ADLs/Self Care Home Management;Cryotherapy;Gait training;Stair training;Functional mobility training;Therapeutic activities;Therapeutic exercise;Balance training;Neuromuscular re-education;Patient/family education    PT Next Visit Plan Dynamic balance activities.  CHECK GOALS/  D/c vs. RECERT next tx. session.    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 [place], [place], [address] Phone: [phone]   Fax:  [phone]  Name: Candice Cook MRN: [id number] Date of Birth: 10-24-1941

## 2020-12-31 ENCOUNTER — Encounter: Payer: Self-pay | Admitting: Physical Therapy

## 2020-12-31 ENCOUNTER — Other Ambulatory Visit: Payer: Self-pay

## 2020-12-31 ENCOUNTER — Ambulatory Visit: Payer: Medicare HMO | Attending: Internal Medicine | Admitting: Physical Therapy

## 2020-12-31 DIAGNOSIS — M6281 Muscle weakness (generalized): Secondary | ICD-10-CM | POA: Insufficient documentation

## 2020-12-31 DIAGNOSIS — R2689 Other abnormalities of gait and mobility: Secondary | ICD-10-CM | POA: Insufficient documentation

## 2020-12-31 NOTE — Therapy (Signed)
[place] [facility] [address] [place], [place], [address] Phone: [phone]   Fax:  [phone]  Physical Therapy Treatment  Patient Details  Name: Candice Cook MRN: [id number] Date of Birth: Dec 29, 1940 Referring Provider (PT): Mancel Bale, MD   Encounter Date: 12/31/2020   PT End of Session - 01/03/21 1608    Visit Number 10    Number of Visits 14    Date for PT Re-Evaluation 01/28/21    Authorization - Visit Number 1    Authorization - Number of Visits 10    PT Start Time 0841    PT Stop Time 0932    PT Time Calculation (min) 51 min    Equipment Utilized During Treatment Gait belt    Activity Tolerance Patient tolerated treatment well    Behavior During Therapy WFL for tasks assessed/performed           Past Medical History:  Diagnosis Date   Anxiety and depression    Arthritis    Broken ankle    Cancer ([place])    skin ca   Depression    Diabetes mellitus without complication ([place])    GERD (gastroesophageal reflux disease)    Gout    Gout    Heart murmur    Hypercholesteremia    Hypertension    Mixed incontinence    Pneumonia    Right lower lobe pneumonia   Thyroid condition    Wears dentures    full upper and lower    Past Surgical History:  Procedure Laterality Date   ABDOMINAL HYSTERECTOMY     APPENDECTOMY  1993   BROW LIFT Bilateral 02/27/2019   Procedure: BLEPHAROPLASTY UPPER EYELID W/EXCESS SKIN;  Surgeon: Karle Starch, MD;  Location: [place];  Service: Ophthalmology;  Laterality: Bilateral;   BROW PTOSIS Bilateral 02/27/2019   Procedure: BROW PTOSIS REPAIR;  Surgeon: Karle Starch, MD;  Location: [place];  Service: Ophthalmology;  Laterality: Bilateral;  Diabetic - oral meds   CATARACT EXTRACTION W/PHACO Left 03/22/2017   Procedure: CATARACT EXTRACTION PHACO AND INTRAOCULAR LENS PLACEMENT ([place]) left diabetic;  Surgeon: Eulogio Bear, MD;  Location:  [place];  Service: Ophthalmology;  Laterality: Left;  Diabetic   CATARACT EXTRACTION W/PHACO Right 05/17/2017   Procedure: CATARACT EXTRACTION PHACO AND INTRAOCULAR LENS PLACEMENT ([place]) Right diabetic;  Surgeon: Eulogio Bear, MD;  Location: [place];  Service: Ophthalmology;  Laterality: Right;   CHOLECYSTECTOMY     COLONOSCOPY WITH PROPOFOL N/A 11/14/2017   Procedure: COLONOSCOPY WITH PROPOFOL;  Surgeon: Lollie Sails, MD;  [facility];  Service: Endoscopy;  Laterality: N/A;   EYE SURGERY     cataracts with extraocular prosthesis   KIDNEY SURGERY     gave daughter a kidney   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   Subjective Assessment - 01/03/21 1604    Subjective Pt. states she had a near fall the other day while closing her blinds.  Pt. states she fell back on cat litter bag.  No injury or cause for losing balance.   Pt. reports no pain prior to tx. session.    Pertinent History Pt. currently retired.    How long can you walk comfortably? 15 mins    Patient Stated Goals Ride a bike (training wheels), walk to mailbox    Currently in Pain? No/denies  [facility] PT Assessment - 01/03/21 0001      Assessment   Medical Diagnosis Gait disorder    Referring Provider (PT) Mancel Bale, MD    Onset Date/Surgical Date 12/28/19      Balance Screen   Has the patient fallen in the past 6 months Yes      [place] residence      Prior Function   Level of Independence Independent      Cognition   Overall Cognitive Status Within Functional Limits for tasks assessed            There.ex.:   Nustep L560mn. B UE/LE(warm-up).Pt. Reports L quad soreness.    Seated/standing hip ex.:3# LAQ/ marching/ lateral walking/ heel raises/ hip extension/ knee flexion)- 20x each.   Neuro Re-ed:  Berg balance test:  48/56.  DGI: 22/24.     Walking in hallwaywith posture correction/ proper gait pattern (increase cadence today).  Sit to stands in front of mirror on Airex 10x.  Standing Airex cone taps (no UE assist) and rebounder-  SBA/CGA for safety.   Walking in //-bars with increase hip flexion/ step pattern/ mirror feedback for posture.  Pt. Working on preventing lateral lean and limited UE assist on //-bars.        PT Long Term Goals - 01/03/21 1612      PT LONG TERM GOAL #1   Title Pt. will improve FOTO score to 65 to improve pain free functional mobility.    Baseline 10/7: 57; 11/10: 53.  1/5: 75 (goal met)    Time 4    Period Weeks    Status Achieved    Target Date 12/31/20      PT LONG TERM GOAL #2   Title Pt. will report no new falls/LOB at home over the next 4 weeks to improve safety and independence with complete ADLs at home.    Baseline 10/7: pt reports frequent LOB at home. 11/10: 3x/day of LOB at home. no falls.  12/31/20: near fall backwards the other day.    Time 4    Period Weeks    Status Partially Met    Target Date 01/28/21      PT LONG TERM GOAL #3   Title Pt. independent with HEP to improve gross LE strength by at least half a muslce grade to improve ability to complete ADLs independently.    Baseline 10/7: R/L out of 5; hip flex: 3+/3+, hip add 4+/4+, knee ext: 5/4+, knee flex: 5/4, ankle dorsiflex: 5/4+; 11/10: R/L out of 5; hip flex: 4/3+, hip add: 5/5, knee ext: 5/4+, knee flex: 5/4+ ankle dorisflex: 5/4    Time 4    Period Weeks    Status Partially Met    Target Date 01/28/21      PT LONG TERM GOAL #4   Title Pt. will report ability to walk from home to mailbox with no LOB and no increase in pain above 5/10 in L calf muslce to improve independence with ADLs.    Baseline 10/7: pt reports unalbe to walk to mailbox. 11/10: pt states she has been unable to walk to mailbox, pain is still high.    Time 4    Period Weeks    Status Partially Met    Target Date  01/28/21      PT LONG TERM GOAL #5   Title Pt. will increase Berg balance test to >51/56 to decrease fall risk and improve safety/ independence  with walking    Baseline Berg: 48/56    Time 4    Period Weeks    Status New    Target Date 01/28/21                 Plan - 01/03/21 1609    Clinical Impression Statement Pt. requires few seated rest breaks and shows good LE endurance with walking/ standing ther.ex.  Pt. worked hard during tx. session and had minimal difficulty with tandem gait initially and improvement noted with multiple attempts. Pt. able to complete >20 sec. tandem stance (L in front of R).  Pt. ascends/ descends stairs with recip. pattern and good balance with light UE assist.  L quad discomfort reported with resisted ther.ex. but symptoms resolve quickly.  See updated goals. FOTO: 75 (goal met).  Pt. will focus on L LE strengthening HEP.    Personal Factors and Comorbidities Time since onset of injury/illness/exacerbation    Examination-Activity Limitations Carry;Locomotion Level    Stability/Clinical Decision Making Stable/Uncomplicated    Clinical Decision Making Low    Rehab Potential Good    PT Frequency 1x / week    PT Duration 4 weeks    PT Treatment/Interventions ADLs/Self Care Home Management;Cryotherapy;Gait training;Stair training;Functional mobility training;Therapeutic activities;Therapeutic exercise;Balance training;Neuromuscular re-education;Patient/family education    PT Next Visit Plan Dynamic balance activities.  L LE strengthening.  Review HEP.    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 [place], [place], [address] Phone: [phone]   Fax:  [phone]  Name: SHEALA DOSH MRN: [id number] Date of Birth: 1941/06/24

## 2021-01-06 DIAGNOSIS — E1169 Type 2 diabetes mellitus with other specified complication: Secondary | ICD-10-CM | POA: Diagnosis not present

## 2021-01-06 DIAGNOSIS — E1159 Type 2 diabetes mellitus with other circulatory complications: Secondary | ICD-10-CM | POA: Diagnosis not present

## 2021-01-06 DIAGNOSIS — E1142 Type 2 diabetes mellitus with diabetic polyneuropathy: Secondary | ICD-10-CM | POA: Diagnosis not present

## 2021-01-06 DIAGNOSIS — E785 Hyperlipidemia, unspecified: Secondary | ICD-10-CM | POA: Diagnosis not present

## 2021-01-06 DIAGNOSIS — I152 Hypertension secondary to endocrine disorders: Secondary | ICD-10-CM | POA: Diagnosis not present

## 2021-01-07 ENCOUNTER — Encounter: Payer: Self-pay | Admitting: Physical Therapy

## 2021-01-07 ENCOUNTER — Other Ambulatory Visit: Payer: Self-pay

## 2021-01-07 ENCOUNTER — Ambulatory Visit: Payer: Medicare HMO | Admitting: Physical Therapy

## 2021-01-07 DIAGNOSIS — M6281 Muscle weakness (generalized): Secondary | ICD-10-CM | POA: Diagnosis not present

## 2021-01-07 DIAGNOSIS — R2689 Other abnormalities of gait and mobility: Secondary | ICD-10-CM

## 2021-01-07 NOTE — Therapy (Signed)
[place] [facility] [address] [place], [place], [address] Phone: [phone]   Fax:  [phone]  Physical Therapy Treatment  Patient Details  Name: Candice Cook MRN: [id number] Date of Birth: 06/30/1941 Referring Provider (PT): Mancel Bale, MD   Encounter Date: 01/07/2021   PT End of Session - 01/07/21 1000    Visit Number 11    Number of Visits 14    Date for PT Re-Evaluation 01/28/21    Authorization - Visit Number 2    Authorization - Number of Visits 10    PT Start Time 0834    PT Stop Time 0927    PT Time Calculation (min) 53 min    Equipment Utilized During Treatment Gait belt    Activity Tolerance Patient tolerated treatment well    Behavior During Therapy [facility] for tasks assessed/performed           Past Medical History:  Diagnosis Date  . Anxiety and depression   . Arthritis   . Broken ankle   . Cancer ([place])    skin ca  . Depression   . Diabetes mellitus without complication ([place])   . GERD (gastroesophageal reflux disease)   . Gout   . Gout   . Heart murmur   . Hypercholesteremia   . Hypertension   . Mixed incontinence   . Pneumonia    Right lower lobe pneumonia  . Thyroid condition   . Wears dentures    full upper and lower    Past Surgical History:  Procedure Laterality Date  . ABDOMINAL HYSTERECTOMY    . APPENDECTOMY  1993  . BROW LIFT Bilateral 02/27/2019   Procedure: BLEPHAROPLASTY UPPER EYELID W/EXCESS SKIN;  Surgeon: Karle Starch, MD;  Location: [place];  Service: Ophthalmology;  Laterality: Bilateral;  . BROW PTOSIS Bilateral 02/27/2019   Procedure: BROW PTOSIS REPAIR;  Surgeon: Karle Starch, MD;  Location: [place];  Service: Ophthalmology;  Laterality: Bilateral;  Diabetic - oral meds  . CATARACT EXTRACTION W/PHACO Left 03/22/2017   Procedure: CATARACT EXTRACTION PHACO AND INTRAOCULAR LENS PLACEMENT ([place]) left diabetic;  Surgeon: Eulogio Bear, MD;  Location:  [place];  Service: Ophthalmology;  Laterality: Left;  Diabetic  . CATARACT EXTRACTION W/PHACO Right 05/17/2017   Procedure: CATARACT EXTRACTION PHACO AND INTRAOCULAR LENS PLACEMENT ([place]) Right diabetic;  Surgeon: Eulogio Bear, MD;  Location: [place];  Service: Ophthalmology;  Laterality: Right;  . CHOLECYSTECTOMY    . COLONOSCOPY WITH PROPOFOL N/A 11/14/2017   Procedure: COLONOSCOPY WITH PROPOFOL;  Surgeon: Lollie Sails, MD;  Location: [facility] ENDOSCOPY;  Service: Endoscopy;  Laterality: N/A;  . EYE SURGERY     cataracts with extraocular prosthesis  . KIDNEY SURGERY     gave daughter a kidney  . 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   Subjective Assessment - 01/07/21 1000    Subjective Pt. reports no new complaints.  No falls.  Pt. states she has minimal discomfort in L quad with increase activity.    Pertinent History Pt. currently retired.    How long can you walk comfortably? 15 mins    Patient Stated Goals Ride a bike (training wheels), walk to mailbox    Currently in Pain? No/denies              There.ex.:  Seated strength reassessment.   Standing marching/ hip abduction  20x (no resistance). Discussed HEP Nustep L4 12 min. B UE/LE  Neuro.:  Rebounder with Airex pad: NBOS/ tandem stance with SBA Star activities: step lunges L/R Tandem Airex lateral walking in //-bars 4x Hallway walking with added head turns/ change in cadence (fast/slow)- aspects of DGI.  Turning CW/ CCW with no UE assist and able to correct any LOB.   PT Long Term Goals - 01/03/21 1612      PT LONG TERM GOAL #1   Title Pt. will improve FOTO score to 65 to improve pain free functional mobility.    Baseline 10/7: 57; 11/10: 53.  1/5: 75 (goal met)    Time 4    Period Weeks    Status Achieved    Target Date 12/31/20      PT LONG TERM GOAL #2   Title Pt. will report no new falls/LOB at home over the next 4  weeks to improve safety and independence with complete ADLs at home.    Baseline 10/7: pt reports frequent LOB at home. 11/10: 3x/day of LOB at home. no falls.  12/31/20: near fall backwards the other day.    Time 4    Period Weeks    Status Partially Met    Target Date 01/28/21      PT LONG TERM GOAL #3   Title Pt. independent with HEP to improve gross LE strength by at least half a muslce grade to improve ability to complete ADLs independently.    Baseline 10/7: R/L out of 5; hip flex: 3+/3+, hip add 4+/4+, knee ext: 5/4+, knee flex: 5/4, ankle dorsiflex: 5/4+; 11/10: R/L out of 5; hip flex: 4/3+, hip add: 5/5, knee ext: 5/4+, knee flex: 5/4+ ankle dorisflex: 5/4    Time 4    Period Weeks    Status Partially Met    Target Date 01/28/21      PT LONG TERM GOAL #4   Title Pt. will report ability to walk from home to mailbox with no LOB and no increase in pain above 5/10 in L calf muslce to improve independence with ADLs.    Baseline 10/7: pt reports unalbe to walk to mailbox. 11/10: pt states she has been unable to walk to mailbox, pain is still high.    Time 4    Period Weeks    Status Partially Met    Target Date 01/28/21      PT LONG TERM GOAL #5   Title Pt. will increase Berg balance test to >51/56 to decrease fall risk and improve safety/ independence with walking    Baseline Berg: 48/56    Time 4    Period Weeks    Status New    Target Date 01/28/21                 Plan - 01/07/21 1001    Clinical Impression Statement Pt required CGA during all balance ex today d/t decreased stability and balance. Pt struggles with tandem balance out of all stances performed today evident by the most frequent loss of balance during ball toss. However, pt was able to catch herself with every loss of balance. Decreased balance r/i an increased risk of falling during ADLs. Dynamic Gait Index showed improvements in pts ability to change cadence and direction witout loss of balance today.     Personal Factors and Comorbidities Time since onset of injury/illness/exacerbation    Examination-Activity Limitations Carry;Locomotion Level    Stability/Clinical Decision Making Stable/Uncomplicated    Clinical  Decision Making Low    Rehab Potential Good    PT Frequency 1x / week    PT Duration 4 weeks    PT Treatment/Interventions ADLs/Self Care Home Management;Cryotherapy;Gait training;Stair training;Functional mobility training;Therapeutic activities;Therapeutic exercise;Balance training;Neuromuscular re-education;Patient/family education    PT Next Visit Plan Dynamic balance activities.  L LE strengthening.  Review HEP.    PT Home Exercise Plan [id number]    Consulted and Agree with Plan of Care Patient           Patient will benefit from skilled therapeutic intervention in order to improve the following deficits and impairments:  Abnormal gait,Decreased activity tolerance,Decreased balance,Decreased endurance,Decreased mobility,Decreased strength,Pain  Visit Diagnosis: Imbalance  Muscle weakness (generalized)  Other abnormalities of gait and mobility     Problem List Patient Active Problem List   Diagnosis Date Noted  . 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 Acute on chronic respiratory failure with hypoxia ([place]) 12/02/2018  . Acute respiratory failure with hypoxia ([place]) 12/02/2018  . Tick bite 06/20/2018  . Pessary maintenance 01/11/2018  . Cellulitis and abscess of right lower extremity 12/28/2017  . Vaginal polyp 12/12/2017  . Vaginal atrophy 12/12/2017  . Cystocele, midline 12/12/2017  . Mixed incontinence 03/23/2016   Pura Spice, PT, DPT # [phone] 01/07/2021, 10:18 AM  [place] [facility] [address] [place], [place], [address] Phone: [phone]   Fax:  [phone]  Name: Candice Cook MRN: [id number] Date of Birth: 03/30/1941

## 2021-01-14 ENCOUNTER — Ambulatory Visit: Payer: Medicare HMO | Admitting: Physical Therapy

## 2021-01-21 ENCOUNTER — Encounter: Payer: Medicare Other | Admitting: Physical Therapy

## 2021-01-28 ENCOUNTER — Encounter: Payer: Medicare Other | Admitting: Physical Therapy

## 2021-02-04 ENCOUNTER — Encounter: Payer: Medicare Other | Admitting: Physical Therapy

## 2021-02-16 DIAGNOSIS — M109 Gout, unspecified: Secondary | ICD-10-CM | POA: Diagnosis not present

## 2021-02-16 DIAGNOSIS — E785 Hyperlipidemia, unspecified: Secondary | ICD-10-CM | POA: Diagnosis not present

## 2021-02-16 DIAGNOSIS — I10 Essential (primary) hypertension: Secondary | ICD-10-CM | POA: Diagnosis not present

## 2021-02-16 DIAGNOSIS — E1142 Type 2 diabetes mellitus with diabetic polyneuropathy: Secondary | ICD-10-CM | POA: Diagnosis not present

## 2021-02-16 DIAGNOSIS — K219 Gastro-esophageal reflux disease without esophagitis: Secondary | ICD-10-CM | POA: Diagnosis not present

## 2021-02-16 DIAGNOSIS — G4700 Insomnia, unspecified: Secondary | ICD-10-CM | POA: Diagnosis not present

## 2021-02-16 DIAGNOSIS — Z8249 Family history of ischemic heart disease and other diseases of the circulatory system: Secondary | ICD-10-CM | POA: Diagnosis not present

## 2021-02-16 DIAGNOSIS — F329 Major depressive disorder, single episode, unspecified: Secondary | ICD-10-CM | POA: Diagnosis not present

## 2021-02-16 DIAGNOSIS — Z825 Family history of asthma and other chronic lower respiratory diseases: Secondary | ICD-10-CM | POA: Diagnosis not present

## 2021-02-16 DIAGNOSIS — Z7982 Long term (current) use of aspirin: Secondary | ICD-10-CM | POA: Diagnosis not present

## 2021-02-16 DIAGNOSIS — Z604 Social exclusion and rejection: Secondary | ICD-10-CM | POA: Diagnosis not present

## 2021-02-16 DIAGNOSIS — N3941 Urge incontinence: Secondary | ICD-10-CM | POA: Diagnosis not present

## 2021-03-25 DIAGNOSIS — E559 Vitamin D deficiency, unspecified: Secondary | ICD-10-CM | POA: Diagnosis not present

## 2021-03-25 DIAGNOSIS — G301 Alzheimer's disease with late onset: Secondary | ICD-10-CM | POA: Diagnosis not present

## 2021-03-25 DIAGNOSIS — F0280 Dementia in other diseases classified elsewhere without behavioral disturbance: Secondary | ICD-10-CM | POA: Diagnosis not present

## 2021-03-25 DIAGNOSIS — E1169 Type 2 diabetes mellitus with other specified complication: Secondary | ICD-10-CM | POA: Diagnosis not present

## 2021-03-25 DIAGNOSIS — I129 Hypertensive chronic kidney disease with stage 1 through stage 4 chronic kidney disease, or unspecified chronic kidney disease: Secondary | ICD-10-CM | POA: Diagnosis not present

## 2021-03-25 DIAGNOSIS — Z8639 Personal history of other endocrine, nutritional and metabolic disease: Secondary | ICD-10-CM | POA: Diagnosis not present

## 2021-03-25 DIAGNOSIS — E538 Deficiency of other specified B group vitamins: Secondary | ICD-10-CM | POA: Diagnosis not present

## 2021-03-25 DIAGNOSIS — M810 Age-related osteoporosis without current pathological fracture: Secondary | ICD-10-CM | POA: Diagnosis not present

## 2021-03-25 DIAGNOSIS — Z0000 Encounter for general adult medical examination without abnormal findings: Secondary | ICD-10-CM | POA: Diagnosis not present

## 2021-03-25 DIAGNOSIS — N182 Chronic kidney disease, stage 2 (mild): Secondary | ICD-10-CM | POA: Diagnosis not present

## 2021-03-25 DIAGNOSIS — E1122 Type 2 diabetes mellitus with diabetic chronic kidney disease: Secondary | ICD-10-CM | POA: Diagnosis not present

## 2021-03-25 DIAGNOSIS — E1142 Type 2 diabetes mellitus with diabetic polyneuropathy: Secondary | ICD-10-CM | POA: Diagnosis not present

## 2021-03-25 DIAGNOSIS — E785 Hyperlipidemia, unspecified: Secondary | ICD-10-CM | POA: Diagnosis not present

## 2021-03-25 DIAGNOSIS — M1A00X0 Idiopathic chronic gout, unspecified site, without tophus (tophi): Secondary | ICD-10-CM | POA: Diagnosis not present

## 2021-03-25 DIAGNOSIS — F3341 Major depressive disorder, recurrent, in partial remission: Secondary | ICD-10-CM | POA: Diagnosis not present

## 2021-03-25 DIAGNOSIS — Z79899 Other long term (current) drug therapy: Secondary | ICD-10-CM | POA: Diagnosis not present

## 2021-05-18 ENCOUNTER — Other Ambulatory Visit: Payer: Self-pay

## 2021-05-18 ENCOUNTER — Ambulatory Visit
Admission: EM | Admit: 2021-05-18 | Discharge: 2021-05-18 | Disposition: A | Discharge status: Home / Self Care | Payer: Medicare HMO | Attending: Internal Medicine | Admitting: Internal Medicine

## 2021-05-18 ENCOUNTER — Encounter: Payer: Self-pay | Admitting: Emergency Medicine

## 2021-05-18 DIAGNOSIS — M461 Sacroiliitis, not elsewhere classified: Secondary | ICD-10-CM | POA: Diagnosis not present

## 2021-05-18 MED ORDER — DICLOFENAC SODIUM 1 % EX GEL: 2.0000 g | Freq: Four times a day (QID) | CUTANEOUS | 0 refills | Status: DC

## 2021-05-18 NOTE — Discharge Instructions (Addendum)
You have Sacroiliitis causing your pain You may benefit to see physical therapy to help you improve

## 2021-05-18 NOTE — ED Provider Notes (Signed)
[facility]    CSN: [id number] Arrival date & time: 05/18/21  1729      History   Chief Complaint Chief Complaint  Patient presents with  . Hip Pain    Right     HPI Candice Cook is a 80 y.o. female who presents with R SI pain x 1 week. This area feels hot to her. She denies injuring herself. Has been going to PT for her L leg pain which is gone now.     Past Medical History:  Diagnosis Date  . Anxiety and depression   . Arthritis   . Broken ankle   . Cancer ([place])    skin ca  . Depression   . Diabetes mellitus without complication ([place])   . GERD (gastroesophageal reflux disease)   . Gout   . Gout   . Heart murmur   . Hypercholesteremia   . Hypertension   . Mixed incontinence   . Pneumonia    Right lower lobe pneumonia  . Thyroid condition   . Wears dentures    full upper and lower    Patient Active Problem List   Diagnosis Date Noted  . 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 Acute on chronic respiratory failure with hypoxia ([place]) 12/02/2018  . Acute respiratory failure with hypoxia ([place]) 12/02/2018  . Tick bite 06/20/2018  . Pessary maintenance 01/11/2018  . Cellulitis and abscess of right lower extremity 12/28/2017  . Vaginal polyp 12/12/2017  . Vaginal atrophy 12/12/2017  . Cystocele, midline 12/12/2017  . Mixed incontinence 03/23/2016    Past Surgical History:  Procedure Laterality Date  . ABDOMINAL HYSTERECTOMY    . APPENDECTOMY  1993  . BROW LIFT Bilateral 02/27/2019   Procedure: BLEPHAROPLASTY UPPER EYELID W/EXCESS SKIN;  Surgeon: Karle Starch, MD;  Location: [place];  Service: Ophthalmology;  Laterality: Bilateral;  . BROW PTOSIS Bilateral 02/27/2019   Procedure: BROW PTOSIS REPAIR;  Surgeon: Karle Starch, MD;  Location: [place];  Service: Ophthalmology;  Laterality: Bilateral;  Diabetic - oral meds  . CATARACT EXTRACTION W/PHACO Left 03/22/2017   Procedure: CATARACT EXTRACTION PHACO AND INTRAOCULAR LENS PLACEMENT  ([place]) left diabetic;  Surgeon: Eulogio Bear, MD;  Location: [place];  Service: Ophthalmology;  Laterality: Left;  Diabetic  . CATARACT EXTRACTION W/PHACO Right 05/17/2017   Procedure: CATARACT EXTRACTION PHACO AND INTRAOCULAR LENS PLACEMENT ([place]) Right diabetic;  Surgeon: Eulogio Bear, MD;  Location: [place];  Service: Ophthalmology;  Laterality: Right;  . CHOLECYSTECTOMY    . COLONOSCOPY WITH PROPOFOL N/A 11/14/2017   Procedure: COLONOSCOPY WITH PROPOFOL;  Surgeon: Lollie Sails, MD;  Location[facility]COPY;  Service: Endoscopy;  Laterality: N/A;  . EYE SURGERY     cataracts with extraocular prosthesis  . KIDNEY SURGERY     gave daughter a kidney  . 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 Authorizing Provider  allopurinol (ZYLOPRIM) 100 MG tablet Take 200 mg daily by mouth.    [provider]  aspirin EC 81 MG tablet Take 81 mg by mouth daily.    [provider]  Blood Glucose Calibration (LIBERTY GLUCOSE CONTROL) Normal LIQD Use as directed. 02/20/14   [provider]  Cyanocobalamin 2500 MCG SUBL Place under the tongue.    [provider]  gabapentin (NEURONTIN) 300 MG capsule Take 900 mg by mouth at bedtime.  03/15/16   [provider]  glimepiride (AMARYL) 2 MG tablet Take 2 mg by mouth daily with breakfast.    [provider]  indomethacin (INDOCIN) 25 MG capsule Take 25 mg by mouth daily. Reported on 05/17/2016 03/08/12   [provider]  Lancets (ONETOUCH ULTRASOFT) lancets USE 1 EACH ONCE DAILY. USE AS INSTRUCTED. ONE TOUCH LANCETS 02/20/16   [provider]  lisinopril-hydrochlorothiazide  (PRINZIDE,ZESTORETIC) 20-12.5 MG tablet TAKE 1 TABLET BY MOUTH EVERY MORNING. FOR BLOOD PRESSURE 03/09/18   [provider]  memantine (NAMENDA) 5 MG tablet Take 5 mg by mouth 2 (two) times daily.    [provider]  omeprazole (PRILOSEC) 40 MG capsule Take 40 mg by mouth daily.    [provider]  ONE TOUCH ULTRA TEST test strip USE ONCE DAILY. AS INSTRUCTED. 02/20/16   [provider]  sertraline (ZOLOFT) 50 MG tablet Take 50 mg by mouth daily.  02/12/13   [provider]  simvastatin (ZOCOR) 20 MG tablet Take 20 mg daily by mouth.    [provider]  traZODone (DESYREL) 50 MG tablet Take 100 mg by mouth at bedtime.     [provider]    Family History Family History  Problem Relation Age of Onset  . Heart disease Mother   . Heart disease Father   . Diabetes Father   . Hypertension Father   . Cancer Brother   . Kidney disease Daughter   . Bladder Cancer Neg Hx   . Kidney cancer Neg Hx   . Breast cancer Neg Hx     Social History Social History   Tobacco Use  . Smoking status: Former Research scientist (life sciences)  . Smokeless tobacco: Never Used  . Tobacco comment: quit 25 years  Vaping Use  . Vaping Use: Never used  Substance Use Topics  . Alcohol use: Cook    Alcohol/week: 0.0 standard drinks  . Drug use: Cook     Allergies   Mirabegron and Metformin and related   Review of Systems Review of Systems  Gastrointestinal: Negative for abdominal pain.  Genitourinary: Negative for difficulty urinating and dysuria.  Musculoskeletal: Positive for back pain.  Skin: Negative for rash and wound.  Neurological: Negative for weakness and numbness.     Physical Exam Triage Vital Signs ED Triage Vitals  Enc Vitals Group     BP 05/18/21 1806 (!) 179/61     Pulse Rate 05/18/21 1806 63     Resp 05/18/21 1806 16     Temp 05/18/21 1806 98.2 F (36.8 C)     Temp src --      SpO2 05/18/21 1806 97 %     Weight --      Height --      Head  Circumference --      Peak Flow --      Pain Score 05/18/21 1805 10     Pain Loc --      Pain Edu? --      Excl.  in [place]? --    Cook data found.  Updated Vital Signs BP (!) 179/61   Pulse 63   Temp 98.2 F (36.8 C)   Resp 16   SpO2 97%   Visual Acuity Right Eye Distance:   Left Eye Distance:   Bilateral Distance:    Right Eye Near:   Left Eye Near:    Bilateral Near:     Physical Exam Vitals and nursing note reviewed.  Constitutional:      General: She is not in acute distress.    Appearance: She is normal weight. She is not toxic-appearing.  HENT:     Head: Normocephalic.     Right Ear: External ear normal.     Left Ear: External ear normal.  Eyes:     General: Cook scleral icterus.    Conjunctiva/sclera: Conjunctivae normal.  Pulmonary:     Effort: Pulmonary effort is normal.  Musculoskeletal:        General: Normal range of motion.     Cervical back: Neck supple.     Comments: BACK- normal ROM, pain provoked with lateral flexion and anterior flexion. Neg SLR HIPS- normal ROM, but external rotation of the R caused SI pain.   Skin:    General: Skin is warm and dry.     Findings: Cook rash.  Neurological:     Mental Status: She is alert and oriented to person, place, and time.     Gait: Gait normal.     Deep Tendon Reflexes: Reflexes normal.  Psychiatric:        Mood and Affect: Mood normal.        Behavior: Behavior normal.        Thought Content: Thought content normal.        Judgment: Judgment normal.      UC Treatments / Results  Labs (all labs ordered are listed, but only abnormal results are displayed) Labs Reviewed - Cook data to display  EKG   Radiology Cook results found.  Procedures Procedures (including critical care time)  Medications Ordered in UC Medications - Cook data to display  Initial Impression / Assessment and Plan / UC Course  I have reviewed the triage vital signs and the nursing notes. Has R sacroiliitis. I prescribed her  Voltaren gel topically as noted and should see her PT to help her heal. She will call and make apt.    Final Clinical Impressions(s) / UC Diagnoses   Final diagnoses:  Sacroiliitis [facility])   Discharge Instructions   None    ED Prescriptions    None     PDMP not reviewed this encounter.   Shelby Mattocks, Hershal Coria 05/18/21 2123

## 2021-05-18 NOTE — ED Triage Notes (Signed)
Pt states that she is having right side hip pain. Pt states that she noticed the pain last week. Pt had a fall a few weeks ago but cannot recall how or why she fell. She just remembers hitting her elbow.  Pt denies any injury

## 2021-05-19 DIAGNOSIS — M810 Age-related osteoporosis without current pathological fracture: Secondary | ICD-10-CM | POA: Diagnosis not present

## 2021-05-19 LAB — HM DEXA SCAN

## 2021-05-27 DIAGNOSIS — E559 Vitamin D deficiency, unspecified: Secondary | ICD-10-CM | POA: Diagnosis not present

## 2021-11-03 DIAGNOSIS — H00012 Hordeolum externum right lower eyelid: Secondary | ICD-10-CM | POA: Diagnosis not present

## 2022-01-15 ENCOUNTER — Encounter: Payer: Self-pay | Admitting: Internal Medicine

## 2022-01-15 ENCOUNTER — Other Ambulatory Visit: Payer: Self-pay

## 2022-01-15 ENCOUNTER — Other Ambulatory Visit: Payer: Self-pay | Admitting: Internal Medicine

## 2022-01-15 ENCOUNTER — Ambulatory Visit (INDEPENDENT_AMBULATORY_CARE_PROVIDER_SITE_OTHER): Payer: Medicare HMO | Admitting: Internal Medicine

## 2022-01-15 VITALS — BP 122/52 | HR 70 | Ht 60.0 in | Wt 107.0 lb

## 2022-01-15 DIAGNOSIS — M5136 Other intervertebral disc degeneration, lumbar region: Secondary | ICD-10-CM | POA: Diagnosis not present

## 2022-01-15 DIAGNOSIS — R269 Unspecified abnormalities of gait and mobility: Secondary | ICD-10-CM | POA: Diagnosis not present

## 2022-01-15 DIAGNOSIS — M51369 Other intervertebral disc degeneration, lumbar region without mention of lumbar back pain or lower extremity pain: Secondary | ICD-10-CM

## 2022-01-15 DIAGNOSIS — E538 Deficiency of other specified B group vitamins: Secondary | ICD-10-CM

## 2022-01-15 DIAGNOSIS — I10 Essential (primary) hypertension: Secondary | ICD-10-CM

## 2022-01-15 DIAGNOSIS — E118 Type 2 diabetes mellitus with unspecified complications: Secondary | ICD-10-CM

## 2022-01-15 DIAGNOSIS — R7303 Prediabetes: Secondary | ICD-10-CM | POA: Diagnosis not present

## 2022-01-15 DIAGNOSIS — E782 Mixed hyperlipidemia: Secondary | ICD-10-CM

## 2022-01-15 DIAGNOSIS — M5442 Lumbago with sciatica, left side: Secondary | ICD-10-CM

## 2022-01-15 DIAGNOSIS — N182 Chronic kidney disease, stage 2 (mild): Secondary | ICD-10-CM | POA: Diagnosis not present

## 2022-01-15 DIAGNOSIS — G8929 Other chronic pain: Secondary | ICD-10-CM

## 2022-01-15 DIAGNOSIS — M109 Gout, unspecified: Secondary | ICD-10-CM

## 2022-01-15 DIAGNOSIS — K219 Gastro-esophageal reflux disease without esophagitis: Secondary | ICD-10-CM

## 2022-01-15 DIAGNOSIS — F5101 Primary insomnia: Secondary | ICD-10-CM

## 2022-01-15 MED ORDER — ALLOPURINOL 100 MG PO TABS: 200.0000 mg | ORAL_TABLET | Freq: Every day | ORAL | 1 refills | Status: DC

## 2022-01-15 MED ORDER — LISINOPRIL-HYDROCHLOROTHIAZIDE 20-12.5 MG PO TABS: 1.0000 | ORAL_TABLET | Freq: Every day | ORAL | 1 refills | Status: DC

## 2022-01-15 MED ORDER — TRAZODONE HCL 50 MG PO TABS: 100.0000 mg | ORAL_TABLET | Freq: Every day | ORAL | 1 refills | Status: DC

## 2022-01-15 MED ORDER — SIMVASTATIN 20 MG PO TABS: 20.0000 mg | ORAL_TABLET | Freq: Every day | ORAL | 1 refills | Status: DC

## 2022-01-15 MED ORDER — OMEPRAZOLE 40 MG PO CPDR: 40.0000 mg | DELAYED_RELEASE_CAPSULE | Freq: Every day | ORAL | 1 refills | Status: DC

## 2022-01-15 NOTE — Progress Notes (Signed)
Date:  01/15/2022   Name:  Candice Cook   DOB:  September 06, 1941   MRN:  [id number]   Chief Complaint: Establish Care  Hypertension This is a chronic problem. The problem is controlled. Pertinent negatives include no chest pain, headaches, palpitations or shortness of breath. Past treatments include ACE inhibitors and diuretics. There is no history of kidney disease, CAD/MI or CVA.  Diabetes She presents for her follow-up diabetic visit. She has type 2 diabetes mellitus. Her disease course has been improving. Pertinent negatives for hypoglycemia include no dizziness, headaches or nervousness/anxiousness. Pertinent negatives for diabetes include no chest pain and no fatigue. Pertinent negatives for diabetic complications include no CVA. Current diabetic treatment includes diet. Weight trend: lost 30 lbs after the death of her husband and son. Her breakfast blood glucose is taken between 6-7 am. Her breakfast blood glucose range is generally 110-130 mg/dl. An ACE inhibitor/angiotensin II receptor blocker is being taken.  Gastroesophageal Reflux She complains of heartburn. She reports no chest pain, no coughing or no wheezing. This is a recurrent problem. The problem occurs rarely. Pertinent negatives include no fatigue. She has tried a PPI for the symptoms. The treatment provided significant relief. Past procedures do not include an EGD, H. pylori antibody titer or a UGI.  Hyperlipidemia This is a chronic problem. The problem is controlled. Pertinent negatives include no chest pain or shortness of breath. Current antihyperlipidemic treatment includes statins. The current treatment provides significant improvement of lipids.  Insomnia Primary symptoms: sleep disturbance, difficulty falling asleep.   The problem occurs nightly. The problem is unchanged.   Lab Results  Component Value Date   NA 140 12/03/2018   K 3.5 12/03/2018   CO2 28 12/03/2018   GLUCOSE 124 (H) 12/03/2018   BUN 13 12/03/2018    CREATININE 0.90 03/11/2020   CALCIUM 8.4 (L) 12/03/2018   GFRNONAA >60 12/03/2018   No results found for: CHOL, HDL, LDLCALC, LDLDIRECT, TRIG, CHOLHDL No results found for: TSH No results found for: HGBA1C Lab Results  Component Value Date   WBC 4.3 12/03/2018   HGB 12.6 12/03/2018   HCT 38.9 12/03/2018   MCV 90.9 12/03/2018   PLT 117 (L) 12/03/2018   Lab Results  Component Value Date   ALT 18 12/02/2018   AST 25 12/02/2018   ALKPHOS 53 12/02/2018   BILITOT 0.9 12/02/2018   No results found for: 25OHVITD2, 25OHVITD3, VD25OH   Review of Systems  Constitutional:  Negative for appetite change, fatigue, fever and unexpected weight change (lost 30 lbs several years ago).  HENT:  Negative for trouble swallowing.   Respiratory:  Negative for cough, chest tightness, shortness of breath and wheezing.   Cardiovascular:  Negative for chest pain, palpitations and leg swelling.  Gastrointestinal:  Positive for abdominal distention (after eating) and heartburn. Negative for constipation, diarrhea and vomiting.  Musculoskeletal:  Positive for back pain and gait problem. Negative for arthralgias.  Skin:  Negative for color change.  Neurological:  Negative for dizziness, light-headedness and headaches.  Psychiatric/Behavioral:  Positive for sleep disturbance. Negative for dysphoric mood. The patient has insomnia. The patient is not nervous/anxious.    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   Pessary maintenance 01/11/2018   Vaginal polyp 12/12/2017   Vaginal atrophy  12/12/2017   Cystocele, midline 12/12/2017   Chronic diarrhea 09/15/2017   Chronic renal impairment, 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   Alzheimer's dementia without behavioral disturbance ([place])  09/30/2016   Mixed incontinence 03/23/2016   B12 deficiency 02/03/2016   High risk medication use 01/15/2016   Primary insomnia 01/15/2016   Vitamin D deficiency, unspecified 01/15/2016   Microalbuminuria 09/23/2015   GERD without esophagitis 05/02/2015   History of cervical cancer 05/02/2015   History of hypothyroidism 05/02/2015   Urge incontinence 05/02/2015   Hyperlipidemia associated with type 2 diabetes mellitus ([place]) 09/20/2014   Type 2 diabetes mellitus with diabetic polyneuropathy ([place]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   Recurrent major depressive disorder, in partial remission ([place]) 10/08/2013   Constipation by delayed colonic transit 08/30/2013   Rectocele 08/30/2013   Hypertension associated with type 2 diabetes mellitus ([place]) 07/02/2013    Allergies  Allergen Reactions   Mirabegron Other (See Comments)    Severe tremor, headache   Metformin And Related Diarrhea    Past Surgical History:  Procedure Laterality Date   ABDOMINAL HYSTERECTOMY     APPENDECTOMY  1993   BROW LIFT Bilateral 02/27/2019   Procedure: BLEPHAROPLASTY UPPER EYELID W/EXCESS SKIN;  Surgeon: Karle Starch, MD;  Location: [place];  Service: Ophthalmology;  Laterality: Bilateral;   BROW PTOSIS Bilateral 02/27/2019   Procedure: BROW PTOSIS REPAIR;  Surgeon: Karle Starch, MD;  Location: [place];  Service: Ophthalmology;  Laterality: Bilateral;  Diabetic - oral meds   CATARACT EXTRACTION W/PHACO Left 03/22/2017   Procedure: CATARACT EXTRACTION PHACO AND INTRAOCULAR LENS PLACEMENT ([place]) left diabetic;  Surgeon: Eulogio Bear, MD;  Location: [place];  Service: Ophthalmology;  Laterality: Left;  Diabetic   CATARACT EXTRACTION W/PHACO Right 05/17/2017   Procedure: CATARACT EXTRACTION PHACO AND INTRAOCULAR LENS PLACEMENT ([place]) Right diabetic;  Surgeon: Eulogio Bear, MD;  Location: [place];  Service: Ophthalmology;  Laterality: Right;   CHOLECYSTECTOMY     COLONOSCOPY WITH  PROPOFOL N/A 11/14/2017   Procedure: COLONOSCOPY WITH PROPOFOL;  Surgeon: Lollie Sails, MD;  Location[facility] ENDOSCOPY;  Service: Endoscopy;  Laterality: N/A;   EYE SURGERY     cataracts with extraocular prosthesis   KIDNEY SURGERY     gave daughter a kidney   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  Current Meds  Medication Sig   allopurinol (ZYLOPRIM) 100 MG tablet Take 200 mg daily by mouth.   aspirin EC 81 MG tablet Take 81 mg by mouth daily.   lisinopril-hydrochlorothiazide (PRINZIDE,ZESTORETIC) 20-12.5 MG tablet TAKE 1 TABLET BY MOUTH EVERY MORNING. FOR BLOOD PRESSURE   Multiple Vitamin (MULTI-VITAMIN PO) Take by mouth.   omeprazole (PRILOSEC) 40 MG capsule Take 40 mg by mouth daily.   sertraline (ZOLOFT) 50 MG tablet Take 50 mg by mouth daily.    simvastatin (ZOCOR) 20 MG tablet Take 20 mg daily by mouth.   traZODone (DESYREL) 50 MG tablet Take 100 mg by mouth at bedtime.     PHQ 2/9 Scores 01/15/2022  PHQ - 2 Score 2  PHQ- 9 Score  12    GAD 7 : Generalized Anxiety Score 01/15/2022  Nervous, Anxious, on Edge 2  Control/stop worrying 2  Worry too much - different things 2  Trouble relaxing 2  Restless 2  Easily annoyed or irritable 1  Afraid - awful might happen 1  Total GAD 7 Score 12    BP Readings from Last 3 Encounters:  01/15/22 (!) 122/52  05/18/21 (!) 179/61  09/16/20 (!) 188/63    Physical Exam Constitutional:      Appearance: Normal appearance.  Neck:     Vascular: No carotid bruit.  Cardiovascular:     Rate and Rhythm: Normal rate and regular rhythm.     Pulses: Normal pulses.     Heart sounds: No murmur heard. Pulmonary:      Effort: Pulmonary effort is normal.     Breath sounds: Normal breath sounds. No wheezing or rhonchi.  Abdominal:     General: Abdomen is flat. Bowel sounds are decreased.     Palpations: Abdomen is soft. There is no hepatomegaly or splenomegaly.     Tenderness: There is generalized abdominal tenderness. There is no right CVA tenderness, left CVA tenderness, guarding or rebound.     Comments: LUQ protuberance when standing.  Musculoskeletal:     Cervical back: Normal range of motion.     Lumbar back: Spasms and tenderness present. Decreased range of motion. No scoliosis.     Right lower leg: No edema.     Left lower leg: No edema.  Lymphadenopathy:     Cervical: No cervical adenopathy.  Skin:    Capillary Refill: Capillary refill takes less than 2 seconds.  Neurological:     General: No focal deficit present.     Mental Status: She is alert.     Gait: Gait normal.    Wt Readings from Last 3 Encounters:  01/15/22 107 lb (48.5 kg)  09/16/20 128 lb 15.5 oz (58.5 kg)  12/18/19 129 lb (58.5 kg)    BP (!) 122/52    Pulse 70    Ht 5' (1.524 m)    Wt 107 lb (48.5 kg)    SpO2 96%    BMI 20.90 kg/m   Assessment and Plan: 1. Benign hypertension Clinically stable exam with well controlled BP. Tolerating medications without side effects at this time. Pt to continue current regimen and low sodium diet; benefits of regular exercise as able discussed. - CBC with Differential/Platelet - lisinopril-hydrochlorothiazide (ZESTORETIC) 20-12.5 MG tablet; Take 1 tablet by mouth daily.  Dispense: 90 tablet; Refill: 1  2. Chronic left-sided low back pain with left-sided sciatica On gabapentin daily. Has been in PT with some benefit and would like to return.  3. Diabetes mellitus with complication ([place]) She states that her DM resolved after weight loss. She is not on medications but checks her BS daily. Her chart says she has peripheral neuropathy but her foot exam is normal.  She says that the  gabapentin is for gout. - Hemoglobin A1c  4. Lumbar degenerative disc disease - Ambulatory referral to Physical Therapy  5. Gouty arthritis of both feet Will need to check labs and advise on medication. - Uric acid - allopurinol (ZYLOPRIM) 100 MG tablet; Take 2 tablets (200 mg total) by mouth daily.  Dispense: 180 tablet; Refill: 1  6. Primary insomnia Contineu Trazodone nightly. - traZODone (DESYREL) 50 MG tablet; Take 2 tablets (100 mg total) by mouth at bedtime.  Dispense: 180 tablet; Refill: 1  7. B12 deficiency Previously  on B12 oral - now only on a multivit. - Vitamin B12  8. Chronic renal impairment, stage 2 (mild) Will monitor regularly. - Comprehensive metabolic panel  9. Gait abnormality Due to back pain. - Ambulatory referral to Physical Therapy  10. Mixed hyperlipidemia On statin therapy for many years without side effects - Lipid panel - simvastatin (ZOCOR) 20 MG tablet; Take 1 tablet (20 mg total) by mouth daily.  Dispense: 90 tablet; Refill: 1  11. GERD without esophagitis No reflux symptoms but has post prandial bloating and discomfort. No hx of EGD.  S/p cholecystectomy, appendectomy. Continue omeprazole.  Consider H Pylori testing if persistent - omeprazole (PRILOSEC) 40 MG capsule; Take 1 capsule (40 mg total) by mouth daily.  Dispense: 90 capsule; Refill: 1   Partially dictated using Editor, commissioning. Any errors are unintentional.  Halina Maidens, MD [facility]  01/15/2022

## 2022-01-16 LAB — CBC WITH DIFFERENTIAL/PLATELET
Basophils Absolute: 0.1 10*3/uL (ref 0.0–0.2)
Basos: 1 %
EOS (ABSOLUTE): 0.2 10*3/uL (ref 0.0–0.4)
Eos: 1 %
Hematocrit: 36.2 % (ref 34.0–46.6)
Hemoglobin: 12.2 g/dL (ref 11.1–15.9)
Immature Grans (Abs): 0 10*3/uL (ref 0.0–0.1)
Immature Granulocytes: 0 %
Lymphocytes Absolute: 7.2 10*3/uL — ABNORMAL HIGH (ref 0.7–3.1)
Lymphs: 56 %
MCH: 30.3 pg (ref 26.6–33.0)
MCHC: 33.7 g/dL (ref 31.5–35.7)
MCV: 90 fL (ref 79–97)
Monocytes Absolute: 0.5 10*3/uL (ref 0.1–0.9)
Monocytes: 4 %
Neutrophils Absolute: 5 10*3/uL (ref 1.4–7.0)
Neutrophils: 38 %
Platelets: 140 10*3/uL — ABNORMAL LOW (ref 150–450)
RBC: 4.03 x10E6/uL (ref 3.77–5.28)
RDW: 13.5 % (ref 11.7–15.4)
WBC: 13 10*3/uL — ABNORMAL HIGH (ref 3.4–10.8)

## 2022-01-16 LAB — HEMOGLOBIN A1C
Est. average glucose Bld gHb Est-mCnc: 131 mg/dL
Hgb A1c MFr Bld: 6.2 % — ABNORMAL HIGH (ref 4.8–5.6)

## 2022-01-16 LAB — COMPREHENSIVE METABOLIC PANEL
ALT: 12 IU/L (ref 0–32)
AST: 18 IU/L (ref 0–40)
Albumin/Globulin Ratio: 2.7 — ABNORMAL HIGH (ref 1.2–2.2)
Albumin: 4 g/dL (ref 3.7–4.7)
Alkaline Phosphatase: 56 IU/L (ref 44–121)
BUN/Creatinine Ratio: 12 (ref 12–28)
BUN: 11 mg/dL (ref 8–27)
Bilirubin Total: 0.4 mg/dL (ref 0.0–1.2)
CO2: 27 mmol/L (ref 20–29)
Calcium: 9 mg/dL (ref 8.7–10.3)
Chloride: 99 mmol/L (ref 96–106)
Creatinine, Ser: 0.93 mg/dL (ref 0.57–1.00)
Globulin, Total: 1.5 g/dL (ref 1.5–4.5)
Glucose: 153 mg/dL — ABNORMAL HIGH (ref 70–99)
Potassium: 3.7 mmol/L (ref 3.5–5.2)
Sodium: 139 mmol/L (ref 134–144)
Total Protein: 5.5 g/dL — ABNORMAL LOW (ref 6.0–8.5)
eGFR: 63 mL/min/{1.73_m2} (ref >59)

## 2022-01-16 LAB — URIC ACID: Uric Acid: 4.6 mg/dL (ref 3.1–7.9)

## 2022-01-16 LAB — LIPID PANEL
Chol/HDL Ratio: 3 ratio (ref 0.0–4.4)
Cholesterol, Total: 140 mg/dL (ref 100–199)
HDL: 47 mg/dL (ref >39)
LDL Chol Calc (NIH): 58 mg/dL (ref 0–99)
Triglycerides: 217 mg/dL — ABNORMAL HIGH (ref 0–149)
VLDL Cholesterol Cal: 35 mg/dL (ref 5–40)

## 2022-01-16 LAB — VITAMIN B12: Vitamin B-12: 382 pg/mL (ref 232–1245)

## 2022-01-17 ENCOUNTER — Encounter: Payer: Self-pay | Admitting: Internal Medicine

## 2022-01-21 ENCOUNTER — Ambulatory Visit: Payer: Medicare HMO | Attending: Internal Medicine | Admitting: Physical Therapy

## 2022-01-21 ENCOUNTER — Encounter: Payer: Self-pay | Admitting: Physical Therapy

## 2022-01-21 ENCOUNTER — Other Ambulatory Visit: Payer: Self-pay

## 2022-01-21 DIAGNOSIS — M5136 Other intervertebral disc degeneration, lumbar region: Secondary | ICD-10-CM | POA: Insufficient documentation

## 2022-01-21 DIAGNOSIS — R269 Unspecified abnormalities of gait and mobility: Secondary | ICD-10-CM | POA: Diagnosis not present

## 2022-01-21 DIAGNOSIS — M79605 Pain in left leg: Secondary | ICD-10-CM | POA: Diagnosis not present

## 2022-01-21 DIAGNOSIS — M6281 Muscle weakness (generalized): Secondary | ICD-10-CM | POA: Diagnosis not present

## 2022-01-21 DIAGNOSIS — R2689 Other abnormalities of gait and mobility: Secondary | ICD-10-CM

## 2022-01-21 NOTE — Therapy (Addendum)
[place] [facility] [address]. [place], [place], [address] Phone: [phone]   Fax:  [phone]  Physical Therapy Evaluation  Patient Details  Name: Candice Cook MRN: [id number] Date of Birth: 1941-02-22 Referring Provider (PT): Dr. Army Melia   Encounter Date: 01/21/2022   PT End of Session - 01/21/22 0932     Visit Number 1    Number of Visits 16    Date for PT Re-Evaluation 03/18/22    Authorization - Visit Number 1    Authorization - Number of Visits 10    PT Start Time 0800    PT Stop Time 0904    PT Time Calculation (min) 64 min    Equipment Utilized During Treatment Gait belt    Activity Tolerance Patient tolerated treatment well;Patient limited by pain    Behavior During Therapy WFL for tasks assessed/performed             Past Medical History:  Diagnosis Date   Anxiety and depression    Arthritis    Broken ankle    Cancer ([place])    skin ca   Depression    Diabetes mellitus without complication ([place])    GERD (gastroesophageal reflux disease)    Gout    Gout    Heart murmur    Hypercholesteremia    Hypertension    Mixed incontinence    Pneumonia    Right lower lobe pneumonia   Thyroid condition    Type 2 diabetes mellitus with diabetic polyneuropathy ([place]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   Wears dentures    full upper and lower    Past Surgical History:  Procedure Laterality Date   ABDOMINAL HYSTERECTOMY     APPENDECTOMY  1993   BROW LIFT Bilateral 02/27/2019   Procedure: BLEPHAROPLASTY UPPER EYELID W/EXCESS SKIN;  Surgeon: Karle Starch, MD;  Location: [place];  Service: Ophthalmology;  Laterality: Bilateral;   BROW PTOSIS Bilateral 02/27/2019   Procedure: BROW PTOSIS REPAIR;  Surgeon: Karle Starch, MD;  Location: [place];  Service: Ophthalmology;  Laterality: Bilateral;  Diabetic - oral meds   CATARACT EXTRACTION W/PHACO Left 03/22/2017   Procedure: CATARACT EXTRACTION PHACO AND INTRAOCULAR LENS  PLACEMENT ([place]) left diabetic;  Surgeon: Eulogio Bear, MD;  Location: [place];  Service: Ophthalmology;  Laterality: Left;  Diabetic   CATARACT EXTRACTION W/PHACO Right 05/17/2017   Procedure: CATARACT EXTRACTION PHACO AND INTRAOCULAR LENS PLACEMENT ([place]) Right diabetic;  Surgeon: Eulogio Bear, MD;  Location: [place];  Service: Ophthalmology;  Laterality: Right;   CHOLECYSTECTOMY     COLONOSCOPY WITH PROPOFOL N/A 11/14/2017   Procedure: COLONOSCOPY WITH PROPOFOL;  Surgeon: Lollie Sails, MD;  Location: [facility];  Service: Endoscopy;  Laterality: N/A;   EYE SURGERY     cataracts with extraocular prosthesis   KIDNEY SURGERY     gave daughter a kidney   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    Subjective Assessment - 01/21/22 0928     Subjective Pt. enters PT with a new bout of L LE pain (distal quad/ calf).  Pt. noted a decrease in balance and wants to work on being able to confidently perform functional ADLs.  See MD order.    Pertinent History Pt. currently retired. Pt. lives alone and has 6 steps with railing entering the house. Pt. has a good demeanor but does mention she is currently  taking care of her brother who is experiencing falls. Pt. loves to sew and walk. Pt. says she has not fallen but did have a close call where she had to catch herself recently. Pt. has a SPC but rarely uses it and states she is aware it would be helpful to use it more consistantly.    Limitations Walking;House hold activities;Other (comment)   Descending stair at home   Patient Stated Goals Pt. wants to get rid of L LE pain and improve safety/balance with walking.    Currently in Pain? No/denies    Multiple Pain Sites No             Posture Standing posture is WFL. Pt. sitting posture has rounded shoulders and forward head tilt, when cued by the PT the Pt. is able to correct but has upper lumbar pain.     Hip Screen AROM: WNL and painless with overpressure in all planes. FABER: R hip pain L hamstring tightness   AROM LB Flexion- WNL upper lumbar pull E- WNL LLF- WNL Pt. feels muscle pull on opposite side RLF-WNL LR- WNL  RR- WNL concordant pain in left leg  Muscle Length L hamstring- 37 degrees Pt. feels pain in distal hamstring  R hamstring- 42 degrees feels tightness but no pain   Strength R/L Hip flexion 4+/4 Hip abd 5/5 Hip add 5/5 Knee flexion 4+/4 Knee extension 4+/4+ Ankle dorsiflexion 4+/4+ * = pain    NEUROLOGICAL:   Mental Status Patient is oriented to person, place and time.  Recent memory is intact.  Remote memory is intact.  Attention span and concentration are intact.  Expressive speech is intact.  Patient's fund of knowledge is within normal limits for educational level.    Outcome Measures Berg balance scale 40/56   Objective measurements completed on examination: See above findings.      PT Education - 01/21/22 0931     Education Details [id number]    Person(s) Educated Patient    Methods Explanation;Demonstration;Handout    Comprehension Verbalized understanding                 PT Long Term Goals - 01/21/22 1007       PT LONG TERM GOAL #1   Title Pt. will improve FOTO score to 62 to improve pain free functional mobility.    Baseline 01/21/2022: 51    Time 8    Period Weeks    Status New    Target Date 03/18/22      PT LONG TERM GOAL #2   Title Pt. will improve Berg balance score to >46/56 to decrease fall risk/ improve mod. independence with gait.    Baseline 1/26: 40/56    Time 8    Period Weeks    Status New    Target Date 03/18/22      PT LONG TERM GOAL #3   Title Pt. will be able to ambulate on uneven terrain without LOB to perform community ADL's.    Baseline 1/26: Pt. experiences LOB when walking on uneven terrain (grass) and needed mod A from PT. to regain balance.    Time 8    Period Weeks    Status New     Target Date 03/18/22      PT LONG TERM GOAL #4   Title Pt. will be able to balance in tandem stance for >30 seconds without CGA to increase balance and improve independence with ADLs.    Baseline 1/26: Pt. unable to  balance for >10 seconds without LOB    Time 8    Period Weeks    Status New    Target Date 03/18/22               Plan - 01/21/22 0943     Clinical Impression Statement Pt. is a pleasant 81 y.o. female with lumbar degenerative disc disease and gait abnormality. Pt. states she has L LE pain from calf all the way up to quad when walking for an extended period of time.  No c/o pain prior to PT tx. session.  Pt. says the pain is relieved only at rest.  Berg balance score of 40/56 putting her at a significant risk of falling. Pt. demonstrated LOB when ambulating on uneven terrain as well performing any stationary/dynamic balance assessment. Pt. required CGA during all ambulation activities as she feels she gets "wobbly" often and needs to catch herself.  Pt. will greatly benefit from balance exercises and flexibility of her LE to allow her to comfortably ambulate and perform functional activites.    Personal Factors and Comorbidities Time since onset of injury/illness/exacerbation    Examination-Activity Limitations Locomotion Level;Bend;Stairs    Stability/Clinical Decision Making Evolving/Moderate complexity    Clinical Decision Making Moderate    Rehab Potential Good    PT Frequency 2x / week    PT Duration 8 weeks    PT Treatment/Interventions ADLs/Self Care Home Management;Cryotherapy;Gait training;Stair training;Functional mobility training;Therapeutic activities;Therapeutic exercise;Balance training;Neuromuscular re-education;Patient/family education;Manual techniques;Passive range of motion    PT Next Visit Plan Dynamic balance activities.  L LE strengthening.    PT Home Exercise Plan [id number]    Consulted and Agree with Plan of Care Patient             Patient  will benefit from skilled therapeutic intervention in order to improve the following deficits and impairments:  Abnormal gait, Decreased activity tolerance, Decreased balance, Decreased endurance, Decreased mobility, Decreased strength, Pain, Decreased coordination, Impaired flexibility, Hypomobility, Difficulty walking, Decreased range of motion, Decreased safety awareness  Visit Diagnosis: Imbalance  Muscle weakness of lower extremity  Gait difficulty  Pain in left leg     Problem List 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   Pessary maintenance 01/11/2018   Vaginal polyp 12/12/2017   Vaginal atrophy 12/12/2017   Cystocele, midline 12/12/2017   Chronic diarrhea 09/15/2017   Chronic renal impairment, 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   Alzheimer's dementia without behavioral disturbance ([place]) 09/30/2016   Mixed incontinence 03/23/2016   B12 deficiency 02/03/2016   High risk medication use 01/15/2016   Primary insomnia 01/15/2016   Vitamin D deficiency, unspecified 01/15/2016   Microalbuminuria 09/23/2015   GERD without esophagitis 05/02/2015   History of cervical cancer 05/02/2015   History of hypothyroidism 05/02/2015   Urge incontinence 05/02/2015   Prediabetes 09/20/2014   Constipation by delayed colonic transit 08/30/2013   Rectocele 08/30/2013   Essential hypertension 07/02/2013   Pura Spice, PT, DPT # [place], SPT 01/21/2022, 1:29 PM  [place] [facility] [address]. [place], [place], [address] Phone: [phone]   Fax:  [phone]  Name: Candice Cook MRN: [id number] Date of Birth:  06/23/1941 ° ° °

## 2022-01-21 NOTE — Patient Instructions (Signed)
Access Code: [email or web address] URL: [email or web address] Date: 01/21/2022 Prepared by: Dorcas Carrow  Exercises Supine Bridge - 1 x daily - 3 x weekly - 2 sets - 10 reps Seated Hamstring Stretch - 1 x daily - 3 x weekly - 3 sets - 3 reps - 20 hold Squat with Chair Touch - 1 x daily - 3 x weekly - 2 sets - 10 reps Standing Tandem Balance with Counter Support - 1 x daily - 3 x weekly - 3 sets - 3 reps - 10-20 hold

## 2022-01-26 ENCOUNTER — Encounter: Payer: Self-pay | Admitting: Physical Therapy

## 2022-01-26 ENCOUNTER — Ambulatory Visit: Payer: Medicare HMO | Admitting: Physical Therapy

## 2022-01-26 ENCOUNTER — Other Ambulatory Visit: Payer: Self-pay

## 2022-01-26 DIAGNOSIS — R2689 Other abnormalities of gait and mobility: Secondary | ICD-10-CM | POA: Diagnosis not present

## 2022-01-26 DIAGNOSIS — M6281 Muscle weakness (generalized): Secondary | ICD-10-CM

## 2022-01-26 DIAGNOSIS — M79605 Pain in left leg: Secondary | ICD-10-CM

## 2022-01-26 DIAGNOSIS — R269 Unspecified abnormalities of gait and mobility: Secondary | ICD-10-CM | POA: Diagnosis not present

## 2022-01-26 DIAGNOSIS — M5136 Other intervertebral disc degeneration, lumbar region: Secondary | ICD-10-CM | POA: Diagnosis not present

## 2022-01-26 NOTE — Therapy (Signed)
[facility] [address]. [place], [place], [address] Phone: [phone]   Fax:  [phone]  Physical Therapy Treatment  Patient Details  Name: Candice Cook MRN: [id number] Date of Birth: December 25, 1941 Referring Provider (PT): Dr. Army Melia   Encounter Date: 01/26/2022   PT End of Session - 01/26/22 1852     Visit Number 2    Number of Visits 16    Date for PT Re-Evaluation 03/18/22    Authorization - Visit Number 2    Authorization - Number of Visits 10    PT Start Time 6010    PT Stop Time 0918    PT Time Calculation (min) 43 min    Equipment Utilized During Treatment Gait belt    Activity Tolerance Patient tolerated treatment well    Behavior During Therapy WFL for tasks assessed/performed             Past Medical History:  Diagnosis Date   Anxiety and depression    Arthritis    Broken ankle    Cancer ([place])    skin ca   Depression    Diabetes mellitus without complication ([place])    GERD (gastroesophageal reflux disease)    Gout    Gout    Heart murmur    Hypercholesteremia    Hypertension    Mixed incontinence    Pneumonia    Right lower lobe pneumonia   Thyroid condition    Type 2 diabetes mellitus with diabetic polyneuropathy ([place]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   Wears dentures    full upper and lower    Past Surgical History:  Procedure Laterality Date   ABDOMINAL HYSTERECTOMY     APPENDECTOMY  1993   BROW LIFT Bilateral 02/27/2019   Procedure: BLEPHAROPLASTY UPPER EYELID W/EXCESS SKIN;  Surgeon: Karle Starch, MD;  Location: [place];  Service: Ophthalmology;  Laterality: Bilateral;   BROW PTOSIS Bilateral 02/27/2019   Procedure: BROW PTOSIS REPAIR;  Surgeon: Karle Starch, MD;  Location: [place];  Service: Ophthalmology;  Laterality: Bilateral;  Diabetic - oral meds   CATARACT EXTRACTION W/PHACO Left 03/22/2017   Procedure: CATARACT EXTRACTION PHACO AND INTRAOCULAR LENS PLACEMENT ([place]) left diabetic;   Surgeon: Eulogio Bear, MD;  Location: [place];  Service: Ophthalmology;  Laterality: Left;  Diabetic   CATARACT EXTRACTION W/PHACO Right 05/17/2017   Procedure: CATARACT EXTRACTION PHACO AND INTRAOCULAR LENS PLACEMENT ([place]) Right diabetic;  Surgeon: Eulogio Bear, MD;  Location: [place];  Service: Ophthalmology;  Laterality: Right;   CHOLECYSTECTOMY     COLONOSCOPY WITH PROPOFOL N/A 11/14/2017   Procedure: COLONOSCOPY WITH PROPOFOL;  Surgeon: Lollie Sails, MD;  Location: [facility];  Service: Endoscopy;  Laterality: N/A;   EYE SURGERY     cataracts with extraocular prosthesis   KIDNEY SURGERY     gave daughter a kidney   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   Subjective Assessment - 01/26/22 1851     Subjective Pt. arrived to PT with no new complaints.  Pt. states she was active over the weekend.  No falls.    Pertinent History Pt. currently retired. Pt. lives alone and has 6 steps with railing entering the house. Pt. has a good demeanor but does mention she is currently taking care of her brother who is experiencing falls. Pt. loves to sew and walk. Pt. says she has not fallen  but did have a close call where she had to catch herself recently. Pt. has a SPC but rarely uses it and states she is aware it would be helpful to use it more consistantly.    Limitations Walking;House hold activities;Other (comment)   Descending stair at home   Patient Stated Goals Pt. wants to get rid of L LE pain and improve safety/balance with walking.    Currently in Pain? Yes   no subjective pain score   Pain Location Leg    Pain Orientation Left                Ther.ex.:  Reviewed HEP  Supine ex.: hip adduction with ball/ abduction with GTB/ bridging 20x each  Supine hamstring stretches L/R 3x each with static holds.    Nustep L2 10 min. B UE/LE   Neuro. Mm.:  Walking in hallway with cuing  to correct posture/ head position.  Added alt. UE/LE touches  //-bars: high marching/ braiding 3 laps each.    Walking in //-bars: hurdles (6") and [employer] with CGA for safety.  Walking outside on varying terrain to car (SBA/CGA for safety and cuing).       PT Long Term Goals - 01/21/22 1007       PT LONG TERM GOAL #1   Title Pt. will improve FOTO score to 62 to improve pain free functional mobility.    Baseline 01/21/2022: 51    Time 8    Period Weeks    Status New    Target Date 03/18/22      PT LONG TERM GOAL #2   Title Pt. will improve Berg balance score to >46/56 to decrease fall risk/ improve mod. independence with gait.    Baseline 1/26: 40/56    Time 8    Period Weeks    Status New    Target Date 03/18/22      PT LONG TERM GOAL #3   Title Pt. will be able to ambulate on uneven terrain without LOB to perform community ADL's.    Baseline 1/26: Pt. experiences LOB when walking on uneven terrain (grass) and needed mod A from PT. to regain balance.    Time 8    Period Weeks    Status New    Target Date 03/18/22      PT LONG TERM GOAL #4   Title Pt. will be able to balance in tandem stance for >30 seconds without CGA to increase balance and improve independence with ADLs.    Baseline 1/26: Pt. unable to balance for >10 seconds without LOB    Time 8    Period Weeks    Status New    Target Date 03/18/22                   Plan - 01/26/22 1852     Clinical Impression Statement Pt. challenged with dynamic balance activities in hallway and in //-bars.  CGA for all balance tasks for safety/ verbal cuing.  Pt. benefits from supine hip/LE strengthening ex. and hamstring stretches to improve functional mobility.  No increase c/o pain with palpation to B quads in supine position.  PT reviewed pts. HEP and encouarged pt. to remain active and be aware of posture/ BOS.    Personal Factors and Comorbidities Time since onset of injury/illness/exacerbation     Examination-Activity Limitations Locomotion Level;Bend;Stairs    Stability/Clinical Decision Making Evolving/Moderate complexity    Clinical Decision Making Moderate    Rehab Potential  Good    PT Frequency 2x / week    PT Duration 8 weeks    PT Treatment/Interventions ADLs/Self Care Home Management;Cryotherapy;Gait training;Stair training;Functional mobility training;Therapeutic activities;Therapeutic exercise;Balance training;Neuromuscular re-education;Patient/family education;Manual techniques;Passive range of motion    PT Next Visit Plan Dynamic balance activities.  L LE strengthening.    PT Home Exercise Plan [id number]    Consulted and Agree with Plan of Care Patient             Patient will benefit from skilled therapeutic intervention in order to improve the following deficits and impairments:  Abnormal gait, Decreased activity tolerance, Decreased balance, Decreased endurance, Decreased mobility, Decreased strength, Pain, Decreased coordination, Impaired flexibility, Hypomobility, Difficulty walking, Decreased range of motion, Decreased safety awareness  Visit Diagnosis: Imbalance  Muscle weakness of lower extremity  Gait difficulty  Pain in left leg     Problem List 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   Pessary maintenance 01/11/2018   Vaginal polyp 12/12/2017   Vaginal atrophy 12/12/2017   Cystocele, midline 12/12/2017   Chronic diarrhea 09/15/2017   Chronic renal impairment, 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   Alzheimer's dementia without behavioral disturbance ([place]) 09/30/2016   Mixed incontinence 03/23/2016   B12 deficiency 02/03/2016   High  risk medication use 01/15/2016   Primary insomnia 01/15/2016   Vitamin D deficiency, unspecified 01/15/2016   Microalbuminuria 09/23/2015   GERD without esophagitis 05/02/2015   History of cervical cancer 05/02/2015   History of hypothyroidism 05/02/2015   Urge incontinence 05/02/2015   Prediabetes 09/20/2014   Constipation by delayed colonic transit 08/30/2013   Rectocele 08/30/2013   Essential hypertension 07/02/2013   Pura Spice, PT, DPT # [id number] 01/26/2022, 6:55 PM  [facility] [address] [place], [place], [address] Phone: [phone]   Fax:  [phone]  Name: Candice Cook MRN: [id number] Date of Birth: 1941/11/16

## 2022-01-28 ENCOUNTER — Other Ambulatory Visit: Payer: Self-pay

## 2022-01-28 ENCOUNTER — Encounter: Payer: Self-pay | Admitting: Physical Therapy

## 2022-01-28 ENCOUNTER — Ambulatory Visit: Payer: Medicare HMO | Attending: Internal Medicine | Admitting: Physical Therapy

## 2022-01-28 DIAGNOSIS — R269 Unspecified abnormalities of gait and mobility: Secondary | ICD-10-CM | POA: Insufficient documentation

## 2022-01-28 DIAGNOSIS — M79605 Pain in left leg: Secondary | ICD-10-CM | POA: Insufficient documentation

## 2022-01-28 DIAGNOSIS — M6281 Muscle weakness (generalized): Secondary | ICD-10-CM | POA: Insufficient documentation

## 2022-01-28 DIAGNOSIS — R2689 Other abnormalities of gait and mobility: Secondary | ICD-10-CM | POA: Insufficient documentation

## 2022-01-28 NOTE — Therapy (Signed)
[facility] [address] [place], [place], [address] Phone: [phone]   Fax:  [phone]  Physical Therapy Treatment  Patient Details  Name: Candice Cook MRN: [id number] Date of Birth: 01/24/1941 Referring Provider (PT): Dr. Army Melia   Encounter Date: 01/28/2022   PT End of Session - 01/28/22 0805     Visit Number 3    Number of Visits 16    Date for PT Re-Evaluation 03/18/22    Authorization - Visit Number 3    Authorization - Number of Visits 10    PT Start Time 0350    PT Stop Time 0839    PT Time Calculation (min) 55 min    Equipment Utilized During Treatment Gait belt    Activity Tolerance Patient tolerated treatment well    Behavior During Therapy WFL for tasks assessed/performed             Past Medical History:  Diagnosis Date   Anxiety and depression    Arthritis    Broken ankle    Cancer ([place])    skin ca   Depression    Diabetes mellitus without complication ([place])    GERD (gastroesophageal reflux disease)    Gout    Gout    Heart murmur    Hypercholesteremia    Hypertension    Mixed incontinence    Pneumonia    Right lower lobe pneumonia   Thyroid condition    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   Wears dentures    full upper and lower    Past Surgical History:  Procedure Laterality Date   ABDOMINAL HYSTERECTOMY     APPENDECTOMY  1993   BROW LIFT Bilateral 02/27/2019   Procedure: BLEPHAROPLASTY UPPER EYELID W/EXCESS SKIN;  Surgeon: Karle Starch, MD;  Location: [place];  Service: Ophthalmology;  Laterality: Bilateral;   BROW PTOSIS Bilateral 02/27/2019   Procedure: BROW PTOSIS REPAIR;  Surgeon: Karle Starch, MD;  Location: [facility];  Service: Ophthalmology;  Laterality: Bilateral;  Diabetic - oral meds   CATARACT EXTRACTION W/PHACO Left 03/22/2017   Procedure: CATARACT EXTRACTION PHACO AND INTRAOCULAR LENS PLACEMENT ([place]) left diabetic;   Surgeon: Eulogio Bear, MD;  Location: [place];  Service: Ophthalmology;  Laterality: Left;  Diabetic   CATARACT EXTRACTION W/PHACO Right 05/17/2017   Procedure: CATARACT EXTRACTION PHACO AND INTRAOCULAR LENS PLACEMENT ([place]) Right diabetic;  Surgeon: Eulogio Bear, MD;  Location: [place];  Service: Ophthalmology;  Laterality: Right;   CHOLECYSTECTOMY     COLONOSCOPY WITH PROPOFOL N/A 11/14/2017   Procedure: COLONOSCOPY WITH PROPOFOL;  Surgeon: Lollie Sails, MD;  Location: [facility];  Service: Endoscopy;  Laterality: N/A;   EYE SURGERY     cataracts with extraocular prosthesis   KIDNEY SURGERY     gave daughter a kidney   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   Subjective Assessment - 01/28/22 0802     Subjective Pt. reports no falls but L thigh is hurting (8/10 pain currently).  Pt. states she will use the furniture/ walls to balance around the house.    Pertinent History Pt. currently retired. Pt. lives alone and has 6 steps with railing entering the house. Pt. has a good demeanor but does mention she is currently taking care of her brother who is experiencing falls. Pt. loves to sew and walk.  Pt. says she has not fallen but did have a close call where she had to catch herself recently. Pt. has a SPC but rarely uses it and states she is aware it would be helpful to use it more consistantly.    Limitations Walking;House hold activities;Other (comment)   Descending stair at home   Patient Stated Goals Pt. wants to get rid of L LE pain and improve safety/balance with walking.    Currently in Pain? Yes    Pain Score 8     Pain Location Leg    Pain Orientation Left              Ther.ex.:   Nustep L3 10 min. B UE/LE   Supine ex.: SLR/ marching/ hip abduction with GTB/ bridging 10x2 each   Supine hamstring/ knee to chest/ piriformis stretches L/R 3x each with static holds.         Neuro. Mm.:  Tandem Airex: lateral walking with light to no UE assist.  CGA for safety.     Walking in hallway with cuing to correct posture/ head position.  Added head turns to recognize letters in hallway.  CGA for safety/ cuing to maintain position in center of hallway.  No LOB but extra time/ focus.     //-bars: high marching/ braiding 3 laps each.    Recip. Stairs with R UE assist on handrail.  Cuing for proper posture/ BOS.  1 episode of scissoring gait and pt. Able to self-correct.      Walking outside on varying terrain to car/ curb (SBA/CGA for safety and cuing).           PT Long Term Goals - 01/21/22 1007       PT LONG TERM GOAL #1   Title Pt. will improve FOTO score to 62 to improve pain free functional mobility.    Baseline 01/21/2022: 51    Time 8    Period Weeks    Status New    Target Date 03/18/22      PT LONG TERM GOAL #2   Title Pt. will improve Berg balance score to >46/56 to decrease fall risk/ improve mod. independence with gait.    Baseline 1/26: 40/56    Time 8    Period Weeks    Status New    Target Date 03/18/22      PT LONG TERM GOAL #3   Title Pt. will be able to ambulate on uneven terrain without LOB to perform community ADL's.    Baseline 1/26: Pt. experiences LOB when walking on uneven terrain (grass) and needed mod A from PT. to regain balance.    Time 8    Period Weeks    Status New    Target Date 03/18/22      PT LONG TERM GOAL #4   Title Pt. will be able to balance in tandem stance for >30 seconds without CGA to increase balance and improve independence with ADLs.    Baseline 1/26: Pt. unable to balance for >10 seconds without LOB    Time 8    Period Weeks    Status New    Target Date 03/18/22                   Plan - 01/28/22 0839     Clinical Impression Statement Pt. presents with no palpable tenderness to L thigh.  PT unable to change L thigh pain with stretches/ ther.ex.  Pt. challenged with Airex pad/ dynamic  balance tasks in hallway.  Pt. benefits from [place] for safety with any tasks requiring multitasking.  PT cued pt. to ambulate with a more consistent arm swing/ posture while maintaining proper BOS to improve balance, esp. when walking outside to car.    Personal Factors and Comorbidities Time since onset of injury/illness/exacerbation    Examination-Activity Limitations Locomotion Level;Bend;Stairs    Stability/Clinical Decision Making Evolving/Moderate complexity    Clinical Decision Making Moderate    Rehab Potential Good    PT Frequency 2x / week    PT Duration 8 weeks    PT Treatment/Interventions ADLs/Self Care Home Management;Cryotherapy;Gait training;Stair training;Functional mobility training;Therapeutic activities;Therapeutic exercise;Balance training;Neuromuscular re-education;Patient/family education;Manual techniques;Passive range of motion    PT Next Visit Plan Dynamic balance activities.  L LE strengthening.    PT Home Exercise Plan [id number]    Consulted and Agree with Plan of Care Patient             Patient will benefit from skilled therapeutic intervention in order to improve the following deficits and impairments:  Abnormal gait, Decreased activity tolerance, Decreased balance, Decreased endurance, Decreased mobility, Decreased strength, Pain, Decreased coordination, Impaired flexibility, Hypomobility, Difficulty walking, Decreased range of motion, Decreased safety awareness  Visit Diagnosis: Imbalance  Muscle weakness of lower extremity  Gait difficulty  Pain in left leg     Problem List 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   Pessary  maintenance 01/11/2018   Vaginal polyp 12/12/2017   Vaginal atrophy 12/12/2017   Cystocele, midline 12/12/2017   Chronic diarrhea 09/15/2017   Chronic renal impairment, 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   Alzheimer's dementia without behavioral disturbance ([place]) 09/30/2016   Mixed incontinence 03/23/2016   B12 deficiency 02/03/2016   High risk medication use 01/15/2016   Primary insomnia 01/15/2016   Vitamin D deficiency, unspecified 01/15/2016   Microalbuminuria 09/23/2015   GERD without esophagitis 05/02/2015   History of cervical cancer 05/02/2015   History of hypothyroidism 05/02/2015   Urge incontinence 05/02/2015   Prediabetes 09/20/2014   Constipation by delayed colonic transit 08/30/2013   Rectocele 08/30/2013   Essential hypertension 07/02/2013   Pura Spice, PT, DPT # [id number] 01/28/2022, 8:43 AM  [facility] [address]. [place], [place], [address] Phone: [phone]   Fax:  [phone]  Name: WYNTER ISAACS MRN: [id number] Date of Birth: 03/07/1941

## 2022-02-02 ENCOUNTER — Other Ambulatory Visit: Payer: Self-pay

## 2022-02-02 ENCOUNTER — Ambulatory Visit: Payer: Medicare HMO | Admitting: Physical Therapy

## 2022-02-02 ENCOUNTER — Encounter: Payer: Self-pay | Admitting: Physical Therapy

## 2022-02-02 DIAGNOSIS — M6281 Muscle weakness (generalized): Secondary | ICD-10-CM | POA: Diagnosis not present

## 2022-02-02 DIAGNOSIS — R269 Unspecified abnormalities of gait and mobility: Secondary | ICD-10-CM

## 2022-02-02 DIAGNOSIS — R2689 Other abnormalities of gait and mobility: Secondary | ICD-10-CM | POA: Diagnosis not present

## 2022-02-02 DIAGNOSIS — M79605 Pain in left leg: Secondary | ICD-10-CM

## 2022-02-02 NOTE — Therapy (Signed)
[facility] [address] [place], [place], [address] Phone: [phone]   Fax:  [phone]  Physical Therapy Treatment  Patient Details  Name: Candice Cook MRN: [id number] Date of Birth: [birth year] Referring Provider (PT): Dr. Army Melia   Encounter Date: 02/02/2022   PT End of Session - 02/02/22 0801     Visit Number 4    Number of Visits 16    Date for PT Re-Evaluation 03/18/22    Authorization - Visit Number 4    Authorization - Number of Visits 10    PT Start Time 1505    PT Stop Time 0846    PT Time Calculation (min) 53 min    Equipment Utilized During Treatment Gait belt    Activity Tolerance Patient tolerated treatment well    Behavior During Therapy WFL for tasks assessed/performed             Past Medical History:  Diagnosis Date   Anxiety and depression    Arthritis    Broken ankle    Cancer ([place])    skin ca   Depression    Diabetes mellitus without complication ([place])    GERD (gastroesophageal reflux disease)    Gout    Gout    Heart murmur    Hypercholesteremia    Hypertension    Mixed incontinence    Pneumonia    Right lower lobe pneumonia   Thyroid condition    Type 2 diabetes mellitus with diabetic polyneuropathy ([place]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   Wears dentures    full upper and lower    Past Surgical History:  Procedure Laterality Date   ABDOMINAL HYSTERECTOMY     APPENDECTOMY  1993   BROW LIFT Bilateral 02/27/2019   Procedure: BLEPHAROPLASTY UPPER EYELID W/EXCESS SKIN;  Surgeon: Karle Starch, MD;  Location: [place];  Service: Ophthalmology;  Laterality: Bilateral;   BROW PTOSIS Bilateral 02/27/2019   Procedure: BROW PTOSIS REPAIR;  Surgeon: Karle Starch, MD;  Location: [place];  Service: Ophthalmology;  Laterality: Bilateral;  Diabetic - oral meds   CATARACT EXTRACTION W/PHACO Left 03/22/2017   Procedure: CATARACT EXTRACTION PHACO AND INTRAOCULAR LENS PLACEMENT ([name]) left diabetic;   Surgeon: Eulogio Bear, MD;  Location: [place];  Service: Ophthalmology;  Laterality: Left;  Diabetic   CATARACT EXTRACTION W/PHACO Right 05/17/2017   Procedure: CATARACT EXTRACTION PHACO AND INTRAOCULAR LENS PLACEMENT ([place]) Right diabetic;  Surgeon: Eulogio Bear, MD;  Location: [facility] [place];  Service: Ophthalmology;  Laterality: Right;   CHOLECYSTECTOMY     COLONOSCOPY WITH PROPOFOL N/A 11/14/2017   Procedure: COLONOSCOPY WITH PROPOFOL;  Surgeon: Lollie Sails, MD;  Location: [facility];  Service: Endoscopy;  Laterality: N/A;   EYE SURGERY     cataracts with extraocular prosthesis   KIDNEY SURGERY     gave daughter a kidney   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   Subjective Assessment - 02/02/22 0754     Subjective Pt. had a difficult weekend due to pts. brother in hospital after stroke.  Pt. has to take care of brother's cat and was limited by increase walking distance from car to hospital room.    Pertinent History Pt. currently retired. Pt. lives alone and has 6 steps with railing entering the house. Pt. has a good demeanor but does mention she is currently taking care of her brother who is  experiencing falls. Pt. loves to sew and walk. Pt. says she has not fallen but did have a close call where she had to catch herself recently. Pt. has a SPC but rarely uses it and states she is aware it would be helpful to use it more consistantly.    Limitations Walking;House hold activities;Other (comment)   Descending stair at home   Patient Stated Goals Pt. wants to get rid of L LE pain and improve safety/balance with walking.    Currently in Pain? No/denies              Ther.ex.:   Nustep L3 10 min. B UE/LE   3# LE ex.: seated LAQ/ marching/ heel and toe raises/ high marching in //-bars/ lateral walking 20x each.  3 laps in //-bars (cuing for upright posture/ mirror feedback/ limited UE  assist)   No stretching today     Neuro. Mm.:  Walking in hallway with added UE/LE touches/ head turns/ alt UE and LE touches 3 laps.    Recip. step pattern with light to no UE assist at stairs with light UE assist on 1 handrail to no UE assist.     Turning in hallway/ walking at varying cadence with CGA and focus on arm swing with proper BOS/ upright posture.     Walking outside on varying terrain to car/ curb (SBA/CGA for safety and cuing).          PT Long Term Goals - 01/21/22 1007       PT LONG TERM GOAL #1   Title Pt. will improve FOTO score to 62 to improve pain free functional mobility.    Baseline 01/21/2022: 51    Time 8    Period Weeks    Status New    Target Date 03/18/22      PT LONG TERM GOAL #2   Title Pt. will improve Berg balance score to >46/56 to decrease fall risk/ improve mod. independence with gait.    Baseline 1/26: 40/56    Time 8    Period Weeks    Status New    Target Date 03/18/22      PT LONG TERM GOAL #3   Title Pt. will be able to ambulate on uneven terrain without LOB to perform community ADL's.    Baseline 1/26: Pt. experiences LOB when walking on uneven terrain (grass) and needed mod A from PT. to regain balance.    Time 8    Period Weeks    Status New    Target Date 03/18/22      PT LONG TERM GOAL #4   Title Pt. will be able to balance in tandem stance for >30 seconds without CGA to increase balance and improve independence with ADLs.    Baseline 1/26: Pt. unable to balance for >10 seconds without LOB    Time 8    Period Weeks    Status New    Target Date 03/18/22               Plan - 02/02/22 1000     Clinical Impression Statement Pt. challenged with multitasking and coordination activities in hallway.  CGA for safety/ cuing with alt. UE/LE touches and changes in walking cadence.  No LOB during tx. but extra time to focus/ complete tasks with proper technique.  Limited arm swing but able to correct with cuing.  Pt.  worked hard during tx. session and progressed to 3# ankle wt. with minimal LE muscle fatigue  noted.  Pt. will continue to benefit from a progressive LE strengthening ex. program to improve balance/ independence with community mobility.    Personal Factors and Comorbidities Time since onset of injury/illness/exacerbation    Examination-Activity Limitations Locomotion Level;Bend;Stairs    Stability/Clinical Decision Making Evolving/Moderate complexity    Clinical Decision Making Moderate    Rehab Potential Good    PT Frequency 2x / week    PT Duration 8 weeks    PT Treatment/Interventions ADLs/Self Care Home Management;Cryotherapy;Gait training;Stair training;Functional mobility training;Therapeutic activities;Therapeutic exercise;Balance training;Neuromuscular re-education;Patient/family education;Manual techniques;Passive range of motion    PT Next Visit Plan Dynamic balance activities.  L LE strengthening.    PT Home Exercise Plan [id number]    Consulted and Agree with Plan of Care Patient             Patient will benefit from skilled therapeutic intervention in order to improve the following deficits and impairments:  Abnormal gait, Decreased activity tolerance, Decreased balance, Decreased endurance, Decreased mobility, Decreased strength, Pain, Decreased coordination, Impaired flexibility, Hypomobility, Difficulty walking, Decreased range of motion, Decreased safety awareness  Visit Diagnosis: Imbalance  Muscle weakness of lower extremity  Gait difficulty  Pain in left leg     Problem List 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   Pessary maintenance  01/11/2018   Vaginal polyp 12/12/2017   Vaginal atrophy 12/12/2017   Cystocele, midline 12/12/2017   Chronic diarrhea 09/15/2017   Chronic renal impairment, 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   Alzheimer's dementia without behavioral disturbance ([place]) 09/30/2016   Mixed incontinence 03/23/2016   B12 deficiency 02/03/2016   High risk medication use 01/15/2016   Primary insomnia 01/15/2016   Vitamin D deficiency, unspecified 01/15/2016   Microalbuminuria 09/23/2015   GERD without esophagitis 05/02/2015   History of cervical cancer 05/02/2015   History of hypothyroidism 05/02/2015   Urge incontinence 05/02/2015   Prediabetes 09/20/2014   Constipation by delayed colonic transit 08/30/2013   Rectocele 08/30/2013   Essential hypertension 07/02/2013   Pura Spice, PT, DPT # [id number] 02/02/2022, 10:52 AM  [facility] [address] [place], [place], [address] Phone: [phone]   Fax:  [phone]  Name: WILLIETTE LOEWE MRN: [id number] Date of Birth: 06/07/1941

## 2022-02-04 ENCOUNTER — Ambulatory Visit: Payer: Medicare HMO | Admitting: Physical Therapy

## 2022-02-04 ENCOUNTER — Other Ambulatory Visit: Payer: Self-pay

## 2022-02-04 ENCOUNTER — Encounter: Payer: Self-pay | Admitting: Physical Therapy

## 2022-02-04 DIAGNOSIS — M79605 Pain in left leg: Secondary | ICD-10-CM | POA: Diagnosis not present

## 2022-02-04 DIAGNOSIS — R2689 Other abnormalities of gait and mobility: Secondary | ICD-10-CM | POA: Diagnosis not present

## 2022-02-04 DIAGNOSIS — R269 Unspecified abnormalities of gait and mobility: Secondary | ICD-10-CM | POA: Diagnosis not present

## 2022-02-04 DIAGNOSIS — M6281 Muscle weakness (generalized): Secondary | ICD-10-CM | POA: Diagnosis not present

## 2022-02-04 NOTE — Therapy (Signed)
[facility] [address] [place], [place], [address] Phone: [phone]   Fax:  [phone]  Physical Therapy Treatment  Patient Details  Name: Candice Cook MRN: [id number] Date of Birth: Dec 07, 1941 Referring Provider (PT): Dr. Army Melia   Encounter Date: 02/04/2022   PT End of Session - 02/04/22 0755     Visit Number 5    Number of Visits 16    Date for PT Re-Evaluation 03/18/22    Authorization - Visit Number 5    Authorization - Number of Visits 10    PT Start Time 6195    PT Stop Time 0848    PT Time Calculation (min) 53 min    Equipment Utilized During Treatment Gait belt    Activity Tolerance Patient tolerated treatment well    Behavior During Therapy WFL for tasks assessed/performed             Past Medical History:  Diagnosis Date   Anxiety and depression    Arthritis    Broken ankle    Cancer ([place])    skin ca   Depression    Diabetes mellitus without complication ([place])    GERD (gastroesophageal reflux disease)    Gout    Gout    Heart murmur    Hypercholesteremia    Hypertension    Mixed incontinence    Pneumonia    Right lower lobe pneumonia   Thyroid condition    Type 2 diabetes mellitus with diabetic polyneuropathy ([place]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   Wears dentures    full upper and lower    Past Surgical History:  Procedure Laterality Date   ABDOMINAL HYSTERECTOMY     APPENDECTOMY  1993   BROW LIFT Bilateral 02/27/2019   Procedure: BLEPHAROPLASTY UPPER EYELID W/EXCESS SKIN;  Surgeon: Karle Starch, MD;  Location: [place];  Service: Ophthalmology;  Laterality: Bilateral;   BROW PTOSIS Bilateral 02/27/2019   Procedure: BROW PTOSIS REPAIR;  Surgeon: Karle Starch, MD;  Location: [place];  Service: Ophthalmology;  Laterality: Bilateral;  Diabetic - oral meds   CATARACT EXTRACTION W/PHACO Left 03/22/2017   Procedure: CATARACT EXTRACTION PHACO AND INTRAOCULAR LENS PLACEMENT ([place]) left diabetic;   Surgeon: Eulogio Bear, MD;  Location: [place];  Service: Ophthalmology;  Laterality: Left;  Diabetic   CATARACT EXTRACTION W/PHACO Right 05/17/2017   Procedure: CATARACT EXTRACTION PHACO AND INTRAOCULAR LENS PLACEMENT ([place]) Right diabetic;  Surgeon: Eulogio Bear, MD;  Location: [place];  Service: Ophthalmology;  Laterality: Right;   CHOLECYSTECTOMY     COLONOSCOPY WITH PROPOFOL N/A 11/14/2017   Procedure: COLONOSCOPY WITH PROPOFOL;  Surgeon: Lollie Sails, MD;  Location: [facility] ENDOSCOPY;  Service: Endoscopy;  Laterality: N/A;   EYE SURGERY     cataracts with extraocular prosthesis   KIDNEY SURGERY     gave daughter a kidney   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   Subjective Assessment - 02/04/22 0755     Subjective Pt. Reports she had difficulty walking from parking lot to see brother at [facility] facility.  Pt. States that she is depressed because of too much on her plate.  Pts. HVAC is not working right.  Pt. Is not interested in pain meds for L LE pain due to it makes her "feel loopy".  PT discussed various options with assistive device to improve walking endurance/ decrease L LE pain.  Pertinent History Pt. currently retired. Pt. lives alone and has 6 steps with railing entering the house. Pt. has a good demeanor but does mention she is currently taking care of her brother who is experiencing falls. Pt. loves to sew and walk. Pt. says she has not fallen but did have a close call where she had to catch herself recently. Pt. has a SPC but rarely uses it and states she is aware it would be helpful to use it more consistantly.    Limitations Walking;House hold activities;Other (comment)   Descending stair at home   Patient Stated Goals Pt. wants to get rid of L LE pain and improve safety/balance with walking.    Currently in Pain? Yes    Pain Score 5     Pain Location Leg    Pain Orientation Left                Ther.ex.:   Nustep L3 10 min. B UE/LE   Supine B LE hamstring/ hip/ gastroc stretches 3x each with 30 sec. Holds.    Supine hip adduction with ball/ SAQ with ball between knees/ bridging 10x each.        Neuro. Mm.:   Walking in //-bars with high marching/ upright posture correction with focus on proper BOS.    Turning in hallway/ walking at varying cadences with CGA and focus on arm swing with proper BOS/ upright posture.     Walking outside on varying terrain to car/ curb (SBA/CGA for safety and cuing).  PT introduced use of rollator to ambulate to improve safety and to allow pt. To take rest breaks while walking community distances.  PT sent MD order to Dr. Army Melia for rollator.          PT Long Term Goals - 01/21/22 1007       PT LONG TERM GOAL #1   Title Pt. will improve FOTO score to 62 to improve pain free functional mobility.    Baseline 01/21/2022: 51    Time 8    Period Weeks    Status New    Target Date 03/18/22      PT LONG TERM GOAL #2   Title Pt. will improve Berg balance score to >46/56 to decrease fall risk/ improve mod. independence with gait.    Baseline 1/26: 40/56    Time 8    Period Weeks    Status New    Target Date 03/18/22      PT LONG TERM GOAL #3   Title Pt. will be able to ambulate on uneven terrain without LOB to perform community ADL's.    Baseline 1/26: Pt. experiences LOB when walking on uneven terrain (grass) and needed mod A from PT. to regain balance.    Time 8    Period Weeks    Status New    Target Date 03/18/22      PT LONG TERM GOAL #4   Title Pt. will be able to balance in tandem stance for >30 seconds without CGA to increase balance and improve independence with ADLs.    Baseline 1/26: Pt. unable to balance for >10 seconds without LOB    Time 8    Period Weeks    Status New    Target Date 03/18/22                   Plan - 02/04/22 0755     Clinical Impression Statement Pt. entered PT  stating that she  is overwhelmed with her current situation of helping out brother/ L LE leg pain with walking.  Pt. states she is limited with walking increase distances due worsening L LE pain.  PT unable to palpate or reproduce L LE leg pain during manual stretches.  PT introduced use of rollator to improve community ambulation/ decrease L LE pain to allow pt. to paricipate with daily activities.  Pt. really likes the rollator use and demonstrates good understanding/ technique for safe community ambulation.  PT faxed MD order for rollator.    Personal Factors and Comorbidities Time since onset of injury/illness/exacerbation    Examination-Activity Limitations Locomotion Level;Bend;Stairs    Stability/Clinical Decision Making Evolving/Moderate complexity    Clinical Decision Making Moderate    Rehab Potential Good    PT Frequency 2x / week    PT Duration 8 weeks    PT Treatment/Interventions ADLs/Self Care Home Management;Cryotherapy;Gait training;Stair training;Functional mobility training;Therapeutic activities;Therapeutic exercise;Balance training;Neuromuscular re-education;Patient/family education;Manual techniques;Passive range of motion    PT Next Visit Plan Dynamic balance activities.  L LE strengthening.    PT Home Exercise Plan [id number]    Consulted and Agree with Plan of Care Patient             Patient will benefit from skilled therapeutic intervention in order to improve the following deficits and impairments:  Abnormal gait, Decreased activity tolerance, Decreased balance, Decreased endurance, Decreased mobility, Decreased strength, Pain, Decreased coordination, Impaired flexibility, Hypomobility, Difficulty walking, Decreased range of motion, Decreased safety awareness  Visit Diagnosis: Imbalance  Muscle weakness of lower extremity  Gait difficulty  Pain in left leg     Problem List Patient Active Problem List   Diagnosis Date Noted   Gouty arthritis of both feet  01/15/2022   Mixed hyperlipidemia 01/15/2022   Gait abnormality 01/15/2022   Chronic left-sided low back pain with left-sided sciatica 04/21/2020   Foraminal stenosis of lumbar region 04/21/2020   Lumbar degenerative disc disease 03/20/2020   Osteoporosis, 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   Pessary maintenance 01/11/2018   Vaginal polyp 12/12/2017   Vaginal atrophy 12/12/2017   Cystocele, midline 12/12/2017   Chronic diarrhea 09/15/2017   Chronic renal impairment, 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   Alzheimer's dementia without behavioral disturbance ([place]) 09/30/2016   Mixed incontinence 03/23/2016   B12 deficiency 02/03/2016   High risk medication use 01/15/2016   Primary insomnia 01/15/2016   Vitamin D deficiency, unspecified 01/15/2016   Microalbuminuria 09/23/2015   GERD without esophagitis 05/02/2015   History of cervical cancer 05/02/2015   History of hypothyroidism 05/02/2015   Urge incontinence 05/02/2015   Prediabetes 09/20/2014   Constipation by delayed colonic transit 08/30/2013   Rectocele 08/30/2013   Essential hypertension 07/02/2013   Pura Spice, PT, DPT # [id number] 02/04/2022, 9:37 AM  [facility] [address] [place], [place], [address] Phone: [phone]   Fax:  [phone]  Name: Candice Cook MRN: [id number] Date of Birth: 11-13-1941

## 2022-02-09 ENCOUNTER — Other Ambulatory Visit: Payer: Self-pay

## 2022-02-09 ENCOUNTER — Ambulatory Visit: Payer: Medicare HMO | Admitting: Physical Therapy

## 2022-02-09 ENCOUNTER — Encounter: Payer: Self-pay | Admitting: Physical Therapy

## 2022-02-09 DIAGNOSIS — R269 Unspecified abnormalities of gait and mobility: Secondary | ICD-10-CM

## 2022-02-09 DIAGNOSIS — R2689 Other abnormalities of gait and mobility: Secondary | ICD-10-CM

## 2022-02-09 DIAGNOSIS — M6281 Muscle weakness (generalized): Secondary | ICD-10-CM | POA: Diagnosis not present

## 2022-02-09 DIAGNOSIS — M79605 Pain in left leg: Secondary | ICD-10-CM | POA: Diagnosis not present

## 2022-02-09 NOTE — Therapy (Addendum)
[facility] [address] [place], [place], [address] Phone: [phone]   Fax:  [phone]  Physical Therapy Treatment  Patient Details  Name: Candice Cook MRN: [id number] Date of Birth: August 14, 1941 Referring Provider (PT): Dr. Army Melia   Encounter Date: 02/09/2022   PT End of Session - 02/09/22 0856     Visit Number 6    Number of Visits 16    Date for PT Re-Evaluation 03/18/22    Authorization - Visit Number 6    Authorization - Number of Visits 10    PT Start Time 0800    PT Stop Time 6962    PT Time Calculation (min) 50 min    Equipment Utilized During Treatment Gait belt    Activity Tolerance Patient tolerated treatment well    Behavior During Therapy WFL for tasks assessed/performed             Past Medical History:  Diagnosis Date   Anxiety and depression    Arthritis    Broken ankle    Cancer ([place])    skin ca   Depression    Diabetes mellitus without complication ([place])    GERD (gastroesophageal reflux disease)    Gout    Gout    Heart murmur    Hypercholesteremia    Hypertension    Mixed incontinence    Pneumonia    Right lower lobe pneumonia   Thyroid condition    Type 2 diabetes mellitus with diabetic polyneuropathy ([place]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   Wears dentures    full upper and lower    Past Surgical History:  Procedure Laterality Date   ABDOMINAL HYSTERECTOMY     APPENDECTOMY  1993   BROW LIFT Bilateral 02/27/2019   Procedure: BLEPHAROPLASTY UPPER EYELID W/EXCESS SKIN;  Surgeon: Karle Starch, MD;  Location: [place];  Service: Ophthalmology;  Laterality: Bilateral;   BROW PTOSIS Bilateral 02/27/2019   Procedure: BROW PTOSIS REPAIR;  Surgeon: Karle Starch, MD;  Location: [place];  Service: Ophthalmology;  Laterality: Bilateral;  Diabetic - oral meds   CATARACT EXTRACTION W/PHACO Left 03/22/2017   Procedure: CATARACT EXTRACTION PHACO AND INTRAOCULAR LENS PLACEMENT ([place]) left diabetic;   Surgeon: Eulogio Bear, MD;  Location: [place];  Service: Ophthalmology;  Laterality: Left;  Diabetic   CATARACT EXTRACTION W/PHACO Right 05/17/2017   Procedure: CATARACT EXTRACTION PHACO AND INTRAOCULAR LENS PLACEMENT ([place]) Right diabetic;  Surgeon: Eulogio Bear, MD;  Location: [place];  Service: Ophthalmology;  Laterality: Right;   CHOLECYSTECTOMY     COLONOSCOPY WITH PROPOFOL N/A 11/14/2017   Procedure: COLONOSCOPY WITH PROPOFOL;  Surgeon: Lollie Sails, MD;  Location: [place] [facility];  Service: Endoscopy;  Laterality: N/A;   EYE SURGERY     cataracts with extraocular prosthesis   KIDNEY SURGERY     gave daughter a kidney   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   Subjective Assessment - 02/09/22 0954     Subjective Pt. reports no pain at beginning of the tx. session. Pt. is eager to get a rollator to help her with her community ambulation as she sometimes experiences LOB/ limited communtiy ambulation.    Pertinent History Pt. currently retired. Pt. lives alone and has 6 steps with railing entering the house. Pt. has a good demeanor but does mention she is currently taking care of her brother who is experiencing falls.  Pt. loves to sew and walk. Pt. says she has not fallen but did have a close call where she had to catch herself recently. Pt. has a SPC but rarely uses it and states she is aware it would be helpful to use it more consistantly.    Limitations Walking;House hold activities    How long can you walk comfortably? --    Patient Stated Goals Pt. wants to get rid of L LE pain and improve safety/balance with walking.  Patient Stated Goals  Pt. wants to get rid of L LE pain and improve safety/balance with walking.    Currently in Pain? No/denies    Pain Score 0-No pain             Ther.ex.:   Nustep L3 10 min. B UE/LE    Supine B LE hamstring/ hip/ gastroc stretches 3x each with 30  sec. Holds. Pt. Muscle guards L LE during stretch.   Supine hip adduction with ball/ SAQ with green bolster/ hip abd. With RTB/ SLR.  10x each        Neuro. Mm.:   Walking in //-bars high marching with contralateral touch upright posture correction with focus on proper BOS.     walking in hallway with rollator CGA and focus on upright posture to increase Pt. Comfort with rollator   Walking outside on varying terrain to car/ curb (SBA/CGA for safety and cuing). Pt. Able to handle varying levels of incline walking around curbs to car. Pt. Receives rollator order and will order on prior to next visit.      PT Long Term Goals - 01/21/22 1007       PT LONG TERM GOAL #1   Title Pt. will improve FOTO score to 62 to improve pain free functional mobility.    Baseline 01/21/2022: 51    Time 8    Period Weeks    Status New    Target Date 03/18/22      PT LONG TERM GOAL #2   Title Pt. will improve Berg balance score to >46/56 to decrease fall risk/ improve mod. independence with gait.    Baseline 1/26: 40/56    Time 8    Period Weeks    Status New    Target Date 03/18/22      PT LONG TERM GOAL #3   Title Pt. will be able to ambulate on uneven terrain without LOB to perform community ADL's.    Baseline 1/26: Pt. experiences LOB when walking on uneven terrain (grass) and needed mod A from PT. to regain balance.    Time 8    Period Weeks    Status New    Target Date 03/18/22      PT LONG TERM GOAL #4   Title Pt. will be able to balance in tandem stance for >30 seconds without CGA to increase balance and improve independence with ADLs.    Baseline 1/26: Pt. unable to balance for >10 seconds without LOB    Time 8    Period Weeks    Status New    Target Date 03/18/22                   Plan - 02/09/22 1000     Clinical Impression Statement Pt. states she is still slightly overwhelmed with her current at home/ family situation. Pt. is continuing to struggle with L LE  weakness and pain when walking longer distances. Pt. is excited to order a rollator  after receiving an MD order for it. Pt. demonstrates ability to functionally and safely ambulate with rollator while it takes weight off her L LE reducing her pain. Pt. can perform all therex. with proper form but does have minor LOB in the //-bar when performing walking high marches. Pt. will continue to benefit from guided PT in a safe environment to increase her ability to perform functional ADLs.    Personal Factors and Comorbidities Time since onset of injury/illness/exacerbation    Examination-Activity Limitations Locomotion Level;Bend;Stairs    Stability/Clinical Decision Making Evolving/Moderate complexity    Clinical Decision Making Moderate    Rehab Potential Good    PT Frequency 2x / week    PT Duration 8 weeks    PT Treatment/Interventions ADLs/Self Care Home Management;Cryotherapy;Gait training;Stair training;Functional mobility training;Therapeutic activities;Therapeutic exercise;Neuromuscular re-education;Balance training;Patient/family education;Manual techniques;Passive range of motion    PT Next Visit Plan Dynamic balance activities. L LE strengthening.  CHECK ROLLATOR USE/ PROPER SIZING    PT Home Exercise Plan [id number]    Consulted and Agree with Plan of Care Patient             Patient will benefit from skilled therapeutic intervention in order to improve the following deficits and impairments:  Abnormal gait, Decreased balance, Decreased activity tolerance, Decreased endurance, Decreased mobility, Decreased strength, Impaired flexibility, Difficulty walking, Decreased coordination, Hypomobility, Decreased range of motion, Decreased safety awareness  Visit Diagnosis: Imbalance  Gait difficulty  Muscle weakness of lower extremity     Problem List Patient Active Problem List   Diagnosis Date Noted   Gouty arthritis of both feet 01/15/2022   Mixed hyperlipidemia 01/15/2022   Gait  abnormality 01/15/2022   Chronic left-sided low back pain with left-sided sciatica 04/21/2020   Foraminal stenosis of lumbar region 04/21/2020   Lumbar degenerative disc disease 03/20/2020   Osteoporosis, 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   Pessary maintenance 01/11/2018   Vaginal polyp 12/12/2017   Vaginal atrophy 12/12/2017   Cystocele, midline 12/12/2017   Chronic diarrhea 09/15/2017   Chronic renal impairment, 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   Alzheimer's dementia without behavioral disturbance ([place]) 09/30/2016   Mixed incontinence 03/23/2016   B12 deficiency 02/03/2016   High risk medication use 01/15/2016   Primary insomnia 01/15/2016   Vitamin D deficiency, unspecified 01/15/2016   Microalbuminuria 09/23/2015   GERD without esophagitis 05/02/2015   History of cervical cancer 05/02/2015   History of hypothyroidism 05/02/2015   Urge incontinence 05/02/2015   Prediabetes 09/20/2014   Constipation by delayed colonic transit 08/30/2013   Rectocele 08/30/2013   Essential hypertension 07/02/2013   Pura Spice, PT, DPT # [id number], SPT 02/09/2022, 10:46 AM  [facility] [address] [place], [place], [address] Phone: [phone]   Fax:  [phone]  Name:  Candice Cook MRN: [id number] Date of Birth: February 15, 1941

## 2022-02-11 ENCOUNTER — Other Ambulatory Visit: Payer: Self-pay

## 2022-02-11 ENCOUNTER — Ambulatory Visit: Payer: Medicare HMO | Admitting: Physical Therapy

## 2022-02-11 ENCOUNTER — Encounter: Payer: Self-pay | Admitting: Physical Therapy

## 2022-02-11 VITALS — BP 159/56

## 2022-02-11 DIAGNOSIS — M6281 Muscle weakness (generalized): Secondary | ICD-10-CM | POA: Diagnosis not present

## 2022-02-11 DIAGNOSIS — R269 Unspecified abnormalities of gait and mobility: Secondary | ICD-10-CM

## 2022-02-11 DIAGNOSIS — M79605 Pain in left leg: Secondary | ICD-10-CM | POA: Diagnosis not present

## 2022-02-11 DIAGNOSIS — R2689 Other abnormalities of gait and mobility: Secondary | ICD-10-CM | POA: Diagnosis not present

## 2022-02-11 NOTE — Therapy (Addendum)
[facility] [address] [place], [place], [address] Phone: [phone]   Fax:  [phone]  Physical Therapy Treatment  Patient Details  Name: Candice Cook MRN: [id number] Date of Birth: 02-Jul-1941 Referring Provider (PT): Dr. Army Melia   Encounter Date: 02/11/2022   PT End of Session - 02/11/22 1043     Visit Number 7    Number of Visits 16    Date for PT Re-Evaluation 03/18/22    Authorization - Visit Number 7    Authorization - Number of Visits 10    PT Start Time 9326    PT Stop Time 7124    PT Time Calculation (min) 48 min    Equipment Utilized During Treatment Gait belt    Activity Tolerance Patient tolerated treatment well    Behavior During Therapy WFL for tasks assessed/performed             Past Medical History:  Diagnosis Date   Anxiety and depression    Arthritis    Broken ankle    Cancer ([place])    skin ca   Depression    Diabetes mellitus without complication ([place])    GERD (gastroesophageal reflux disease)    Gout    Gout    Heart murmur    Hypercholesteremia    Hypertension    Mixed incontinence    Pneumonia    Right lower lobe pneumonia   Thyroid condition    Type 2 diabetes mellitus with diabetic polyneuropathy ([place]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   Wears dentures    full upper and lower    Past Surgical History:  Procedure Laterality Date   ABDOMINAL HYSTERECTOMY     APPENDECTOMY  1993   BROW LIFT Bilateral 02/27/2019   Procedure: BLEPHAROPLASTY UPPER EYELID W/EXCESS SKIN;  Surgeon: Karle Starch, MD;  Location: [facility];  Service: Ophthalmology;  Laterality: Bilateral;   BROW PTOSIS Bilateral 02/27/2019   Procedure: BROW PTOSIS REPAIR;  Surgeon: Karle Starch, MD;  Location: [facility];  Service: Ophthalmology;  Laterality: Bilateral;  Diabetic - oral meds   CATARACT EXTRACTION W/PHACO Left 03/22/2017   Procedure: CATARACT EXTRACTION PHACO AND INTRAOCULAR LENS PLACEMENT ([place]) left diabetic;   Surgeon: Eulogio Bear, MD;  Location: [facility];  Service: Ophthalmology;  Laterality: Left;  Diabetic   CATARACT EXTRACTION W/PHACO Right 05/17/2017   Procedure: CATARACT EXTRACTION PHACO AND INTRAOCULAR LENS PLACEMENT ([place]) Right diabetic;  Surgeon: Eulogio Bear, MD;  Location: [facility];  Service: Ophthalmology;  Laterality: Right;   CHOLECYSTECTOMY     COLONOSCOPY WITH PROPOFOL N/A 11/14/2017   Procedure: COLONOSCOPY WITH PROPOFOL;  Surgeon: Lollie Sails, MD;  Location: [facility] ENDOSCOPY;  Service: Endoscopy;  Laterality: N/A;   EYE SURGERY     cataracts with extraocular prosthesis   KIDNEY SURGERY     gave daughter a kidney   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 has no pain when entering clinic. Pt. purchased a rollator yesterday and brought it into clinic to make sure it is sized correctly by PT. Pt. mentions feeling dizzy when waking up this morning.  PT assessed vital signs.    Pertinent History Pt. currently retired. Pt. lives alone and has 6 steps with railing entering the house. Pt. has a good demeanor  but does mention she is currently taking care of her brother who is experiencing falls. Pt. loves to sew and walk. Pt. says she has not fallen but did have a close call where she had to catch herself recently. Pt. has a SPC but rarely uses it and states she is aware it would be helpful to use it more consistantly.    Limitations Walking;House hold activities    Patient Stated Goals Pt. wants to get rid of L LE pain and improve safety/balance with walking.  Patient Stated Goals  Pt. wants to get rid of L LE pain and improve safety/balance with walking.    Currently in Pain? No/denies              Ther.ex.:   Nustep L3 10 min. B UE/LE    Supine B LE hamstring/ hip/ gastroc stretches 3x each with 30  sec. Holds. Pt. Muscle guards L LE during stretch.   SAQ with green bolster/ SLR.  10x each   Standing hip abd. Pt. Limited by dizziness.        Neuro. Mm.:   Walking in //-bars high marching. Mirror feedback  Walking in //-bars forward/backward/lateral. Pharmacist, hospital. Pt. Has multiple LOB needing PT correction.  Walking to car with CGA to bring Pt.s new rollator in.  Walking in hallway with CGA from PT. Pt. Reports unsteadiness post therex.     PT Long Term Goals - 01/21/22 1007       PT LONG TERM GOAL #1   Title Pt. will improve FOTO score to 62 to improve pain free functional mobility.    Baseline 01/21/2022: 51    Time 8    Period Weeks    Status New    Target Date 03/18/22      PT LONG TERM GOAL #2   Title Pt. will improve Berg balance score to >46/56 to decrease fall risk/ improve mod. independence with gait.    Baseline 1/26: 40/56    Time 8    Period Weeks    Status New    Target Date 03/18/22      PT LONG TERM GOAL #3   Title Pt. will be able to ambulate on uneven terrain without LOB to perform community ADL's.    Baseline 1/26: Pt. experiences LOB when walking on uneven terrain (grass) and needed mod A from PT. to regain balance.    Time 8    Period Weeks    Status New    Target Date 03/18/22      PT LONG TERM GOAL #4   Title Pt. will be able to balance in tandem stance for >30 seconds without CGA to increase balance and improve independence with ADLs.    Baseline 1/26: Pt. unable to balance for >10 seconds without LOB    Time 8    Period Weeks    Status New    Target Date 03/18/22                Plan - 02/11/22 1044     Clinical Impression Statement Pt. arrives to clinic with new rollator and is educated by PT on proper use/ sizing. Pt. continues to benefit from ambulation practice with CGA as she is extremely susceptible to LOB throughout functional ADLs. Pt. reports increased fatigue and notes she feels "loopy since waking up. Pt. was  able to perform //-bar activities and then notes dizziness post exercise. PT performed LE stretches to try and decrease muscle tightness and pain.  Pt. then had an increase bout of dizziness once she sat up from stretches limiting amount of exercise, she could perform during tx. PT. took BP at beginning (152/49) and end (159/56) of session to make sure she was WNL. Pt. will continue to benefit from safely guarded ambulation and strengthening exercises so that she can perform her functional ADLs as safely as possible.    Personal Factors and Comorbidities Time since onset of injury/illness/exacerbation    Examination-Activity Limitations Locomotion Level;Bend;Stairs    Stability/Clinical Decision Making Evolving/Moderate complexity    Clinical Decision Making Moderate    Rehab Potential Good    PT Frequency 2x / week    PT Duration 8 weeks    PT Treatment/Interventions ADLs/Self Care Home Management;Cryotherapy;Gait training;Stair training;Functional mobility training;Therapeutic activities;Therapeutic exercise;Neuromuscular re-education;Balance training;Patient/family education;Manual techniques;Passive range of motion    PT Next Visit Plan Dynamic balance activities. L LE strengthening.    PT Home Exercise Plan [id number]    Consulted and Agree with Plan of Care Patient             Patient will benefit from skilled therapeutic intervention in order to improve the following deficits and impairments:  Abnormal gait, Decreased balance, Decreased activity tolerance, Decreased endurance, Decreased mobility, Decreased strength, Impaired flexibility, Difficulty walking, Decreased coordination, Hypomobility, Decreased range of motion, Decreased safety awareness  Visit Diagnosis: Imbalance  Gait difficulty  Muscle weakness of lower extremity     Problem List Patient Active Problem List   Diagnosis Date Noted   Gouty arthritis of both feet 01/15/2022   Mixed hyperlipidemia 01/15/2022   Gait  abnormality 01/15/2022   Chronic left-sided low back pain with left-sided sciatica 04/21/2020   Foraminal stenosis of lumbar region 04/21/2020   Lumbar degenerative disc disease 03/20/2020   Osteoporosis, 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   Pessary maintenance 01/11/2018   Vaginal polyp 12/12/2017   Vaginal atrophy 12/12/2017   Cystocele, midline 12/12/2017   Chronic diarrhea 09/15/2017   Chronic renal impairment, 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   Alzheimer's dementia without behavioral disturbance ([place]) 09/30/2016   Mixed incontinence 03/23/2016   B12 deficiency 02/03/2016   High risk medication use 01/15/2016   Primary insomnia 01/15/2016   Vitamin D deficiency, unspecified 01/15/2016   Microalbuminuria 09/23/2015   GERD without esophagitis 05/02/2015   History of cervical cancer 05/02/2015   History of hypothyroidism 05/02/2015   Urge incontinence 05/02/2015   Prediabetes 09/20/2014   Constipation by delayed colonic transit 08/30/2013   Rectocele 08/30/2013   Essential hypertension 07/02/2013   Pura Spice, PT, DPT # [id number] [place], SPT 02/11/2022, 12:40 PM  [facility] [address]. [place], [place], [address] Phone: [phone]   Fax:  [phone]  Name: Candice Cook MRN: [id number] Date of Birth: 12/30/1940

## 2022-02-16 ENCOUNTER — Other Ambulatory Visit: Payer: Self-pay

## 2022-02-16 ENCOUNTER — Ambulatory Visit: Payer: Medicare HMO | Admitting: Physical Therapy

## 2022-02-16 ENCOUNTER — Encounter: Payer: Self-pay | Admitting: Physical Therapy

## 2022-02-16 DIAGNOSIS — M6281 Muscle weakness (generalized): Secondary | ICD-10-CM | POA: Diagnosis not present

## 2022-02-16 DIAGNOSIS — M79605 Pain in left leg: Secondary | ICD-10-CM

## 2022-02-16 DIAGNOSIS — R2689 Other abnormalities of gait and mobility: Secondary | ICD-10-CM

## 2022-02-16 DIAGNOSIS — R269 Unspecified abnormalities of gait and mobility: Secondary | ICD-10-CM | POA: Diagnosis not present

## 2022-02-16 NOTE — Therapy (Signed)
[facility] [address] [place], [place], [address] Phone: [phone]   Fax:  [phone]  Physical Therapy Treatment  Patient Details  Name: Candice Cook MRN: [id number] Date of Birth: Jun 04, 1941 Referring Provider (PT): Dr. Army Melia   Encounter Date: 02/16/2022   PT End of Session - 02/16/22 0825     Visit Number 8    Number of Visits 16    Date for PT Re-Evaluation 03/18/22    Authorization - Visit Number 8    Authorization - Number of Visits 10    PT Start Time 0802    PT Stop Time 7793    PT Time Calculation (min) 47 min    Equipment Utilized During Treatment Gait belt    Activity Tolerance Patient tolerated treatment well    Behavior During Therapy WFL for tasks assessed/performed             Past Medical History:  Diagnosis Date   Anxiety and depression    Arthritis    Broken ankle    Cancer ([place])    skin ca   Depression    Diabetes mellitus without complication ([place])    GERD (gastroesophageal reflux disease)    Gout    Gout    Heart murmur    Hypercholesteremia    Hypertension    Mixed incontinence    Pneumonia    Right lower lobe pneumonia   Thyroid condition    Type 2 diabetes mellitus with diabetic polyneuropathy ([place]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   Wears dentures    full upper and lower    Past Surgical History:  Procedure Laterality Date   ABDOMINAL HYSTERECTOMY     APPENDECTOMY  1993   BROW LIFT Bilateral 02/27/2019   Procedure: BLEPHAROPLASTY UPPER EYELID W/EXCESS SKIN;  Surgeon: Karle Starch, MD;  Location: [facility];  Service: Ophthalmology;  Laterality: Bilateral;   BROW PTOSIS Bilateral 02/27/2019   Procedure: BROW PTOSIS REPAIR;  Surgeon: Karle Starch, MD;  Location: [facility];  Service: Ophthalmology;  Laterality: Bilateral;  Diabetic - oral meds   CATARACT EXTRACTION W/PHACO Left 03/22/2017   Procedure: CATARACT EXTRACTION PHACO AND INTRAOCULAR LENS PLACEMENT ([place]) left diabetic;   Surgeon: Eulogio Bear, MD;  Location: [facility];  Service: Ophthalmology;  Laterality: Left;  Diabetic   CATARACT EXTRACTION W/PHACO Right 05/17/2017   Procedure: CATARACT EXTRACTION PHACO AND INTRAOCULAR LENS PLACEMENT ([place]) Right diabetic;  Surgeon: Eulogio Bear, MD;  Location: [facility];  Service: Ophthalmology;  Laterality: Right;   CHOLECYSTECTOMY     COLONOSCOPY WITH PROPOFOL N/A 11/14/2017   Procedure: COLONOSCOPY WITH PROPOFOL;  Surgeon: Lollie Sails, MD;  Location: [facility] ENDOSCOPY;  Service: Endoscopy;  Laterality: N/A;   EYE SURGERY     cataracts with extraocular prosthesis   KIDNEY SURGERY     gave daughter a kidney   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   Subjective Assessment - 02/16/22 0823     Subjective Pt. reports minimal L LE discomfort with increase activity this morning (walking/ Nustep).  Pt. reports L LE "feels tight" but not painful.  Pt. states she had a loss of balance while moving litter box and fell on backside.  Pt. was able to return to standing independently and no injuries reported.    Pertinent History Pt. currently retired. Pt. lives alone and has 6 steps with railing entering the  house. Pt. has a good demeanor but does mention she is currently taking care of her brother who is experiencing falls. Pt. loves to sew and walk. Pt. says she has not fallen but did have a close call where she had to catch herself recently. Pt. has a SPC but rarely uses it and states she is aware it would be helpful to use it more consistantly.    Limitations Walking;House hold activities    Patient Stated Goals Pt. wants to get rid of L LE pain and improve safety/balance with walking.  Patient Stated Goals  Pt. wants to get rid of L LE pain and improve safety/balance with walking.    Currently in Pain? No/denies             152/44 (no c/o dizziness).     Ther.ex.:   Nustep L3 10  min. B UE/LE    Seated/ standing 4# ex.: marching/ LAQ/ standing marching/ hip abduction/ hip extension/ knee flexion/ standing toe and heel raises 20x.     Supine B LE hamstring/ hip/ gastroc stretches 3x each with 30 sec. Holds. Pt. Muscle guards L LE during stretch.      Neuro. Mm.:   Walking in //-bars high marching. Mirror feedback   Walking in //-bars forward/backward/lateral. Pharmacist, hospital. Pt. Has multiple LOB but able to self-correct in //-bars with UE assist.    Amb. In hallway with no assistive device and consistent cadence working on arm swing/ step pattern.     Walking to car with CGA and no assistive device.  Descending curb with CGA safely.            PT Long Term Goals - 01/21/22 1007       PT LONG TERM GOAL #1   Title Pt. will improve FOTO score to 62 to improve pain free functional mobility.    Baseline 01/21/2022: 51    Time 8    Period Weeks    Status New    Target Date 03/18/22      PT LONG TERM GOAL #2   Title Pt. will improve Berg balance score to >46/56 to decrease fall risk/ improve mod. independence with gait.    Baseline 1/26: 40/56    Time 8    Period Weeks    Status New    Target Date 03/18/22      PT LONG TERM GOAL #3   Title Pt. will be able to ambulate on uneven terrain without LOB to perform community ADL's.    Baseline 1/26: Pt. experiences LOB when walking on uneven terrain (grass) and needed mod A from PT. to regain balance.    Time 8    Period Weeks    Status New    Target Date 03/18/22      PT LONG TERM GOAL #4   Title Pt. will be able to balance in tandem stance for >30 seconds without CGA to increase balance and improve independence with ADLs.    Baseline 1/26: Pt. unable to balance for >10 seconds without LOB    Time 8    Period Weeks    Status New    Target Date 03/18/22                Plan - 02/16/22 0825     Clinical Impression Statement Pt. reports she feels she is losing body weight and states she has  been told by MD that she should not lose anymore weight.  Pt. had several small  LOB t/o session and able to self-correct.  CGA required t/o session for safety and verbal cuing.  Pt. is planning to get a rollator after returning the previous rollator that broke since last tx. session.  Good motivation t/o session with slight increase in L LE pain during standing ther.ex.  Pt. will continue to benefit from skilled PT services to increase LE strength to improve safe functional mobility.    Personal Factors and Comorbidities Time since onset of injury/illness/exacerbation    Examination-Activity Limitations Locomotion Level;Bend;Stairs    Stability/Clinical Decision Making Evolving/Moderate complexity    Clinical Decision Making Moderate    Rehab Potential Good    PT Frequency 2x / week    PT Duration 8 weeks    PT Treatment/Interventions ADLs/Self Care Home Management;Cryotherapy;Gait training;Stair training;Functional mobility training;Therapeutic activities;Therapeutic exercise;Neuromuscular re-education;Balance training;Patient/family education;Manual techniques;Passive range of motion    PT Next Visit Plan Dynamic balance activities. L LE strengthening.    PT Home Exercise Plan [id number]    Consulted and Agree with Plan of Care Patient             Patient will benefit from skilled therapeutic intervention in order to improve the following deficits and impairments:  Abnormal gait, Decreased balance, Decreased activity tolerance, Decreased endurance, Decreased mobility, Decreased strength, Impaired flexibility, Difficulty walking, Decreased coordination, Hypomobility, Decreased range of motion, Decreased safety awareness  Visit Diagnosis: Imbalance  Gait difficulty  Muscle weakness of lower extremity  Pain in left leg     Problem List Patient Active Problem List   Diagnosis Date Noted   Gouty arthritis of both feet 01/15/2022   Mixed hyperlipidemia 01/15/2022   Gait abnormality  01/15/2022   Chronic left-sided low back pain with left-sided sciatica 04/21/2020   Foraminal stenosis of lumbar region 04/21/2020   Lumbar degenerative disc disease 03/20/2020   Osteoporosis, 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   Pessary maintenance 01/11/2018   Vaginal polyp 12/12/2017   Vaginal atrophy 12/12/2017   Cystocele, midline 12/12/2017   Chronic diarrhea 09/15/2017   Chronic renal impairment, 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   Alzheimer's dementia without behavioral disturbance ([place]) 09/30/2016   Mixed incontinence 03/23/2016   B12 deficiency 02/03/2016   High risk medication use 01/15/2016   Primary insomnia 01/15/2016   Vitamin D deficiency, unspecified 01/15/2016   Microalbuminuria 09/23/2015   GERD without esophagitis 05/02/2015   History of cervical cancer 05/02/2015   History of hypothyroidism 05/02/2015   Urge incontinence 05/02/2015   Prediabetes 09/20/2014   Constipation by delayed colonic transit 08/30/2013   Rectocele 08/30/2013   Essential hypertension 07/02/2013   Pura Spice, PT, DPT # [place], SPT 02/16/2022, 4:39 PM  [facility] [address] [place], [place], [address] Phone: [phone]   Fax:  [phone]  Name: NALAH MACIOCE MRN: [id number] Date of Birth: [birth year]

## 2022-02-18 ENCOUNTER — Ambulatory Visit: Payer: Medicare HMO | Admitting: Physical Therapy

## 2022-02-18 ENCOUNTER — Other Ambulatory Visit: Payer: Self-pay

## 2022-02-18 DIAGNOSIS — R269 Unspecified abnormalities of gait and mobility: Secondary | ICD-10-CM | POA: Diagnosis not present

## 2022-02-18 DIAGNOSIS — M6281 Muscle weakness (generalized): Secondary | ICD-10-CM | POA: Diagnosis not present

## 2022-02-18 DIAGNOSIS — R2689 Other abnormalities of gait and mobility: Secondary | ICD-10-CM

## 2022-02-18 DIAGNOSIS — M79605 Pain in left leg: Secondary | ICD-10-CM

## 2022-02-20 NOTE — Therapy (Signed)
[facility] [address] [place], [place], [address] Phone: [phone]   Fax:  [phone]  Physical Therapy Treatment  Patient Details  Name: Candice Cook MRN: [id number] Date of Birth: 05-11-1941 Referring Provider (PT): Dr. Army Melia   Encounter Date: 02/18/2022   PT End of Session - 02/20/22 1448     Visit Number 9    Number of Visits 16    Date for PT Re-Evaluation 03/18/22    Authorization - Visit Number 9    Authorization - Number of Visits 10    PT Start Time 0752    PT Stop Time 0845    PT Time Calculation (min) 53 min    Equipment Utilized During Treatment Gait belt    Activity Tolerance Patient tolerated treatment well    Behavior During Therapy WFL for tasks assessed/performed             Past Medical History:  Diagnosis Date   Anxiety and depression    Arthritis    Broken ankle    Cancer ([place])    skin ca   Depression    Diabetes mellitus without complication ([place])    GERD (gastroesophageal reflux disease)    Gout    Gout    Heart murmur    Hypercholesteremia    Hypertension    Mixed incontinence    Pneumonia    Right lower lobe pneumonia   Thyroid condition    Type 2 diabetes mellitus with diabetic polyneuropathy ([place]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   Wears dentures    full upper and lower    Past Surgical History:  Procedure Laterality Date   ABDOMINAL HYSTERECTOMY     APPENDECTOMY  1993   BROW LIFT Bilateral 02/27/2019   Procedure: BLEPHAROPLASTY UPPER EYELID W/EXCESS SKIN;  Surgeon: Karle Starch, MD;  Location: [place];  Service: Ophthalmology;  Laterality: Bilateral;   BROW PTOSIS Bilateral 02/27/2019   Procedure: BROW PTOSIS REPAIR;  Surgeon: Karle Starch, MD;  Location: [place];  Service: Ophthalmology;  Laterality: Bilateral;  Diabetic - oral meds   CATARACT EXTRACTION W/PHACO Left 03/22/2017   Procedure: CATARACT EXTRACTION PHACO AND INTRAOCULAR LENS PLACEMENT ([place]) left diabetic;   Surgeon: Eulogio Bear, MD;  Location: [place];  Service: Ophthalmology;  Laterality: Left;  Diabetic   CATARACT EXTRACTION W/PHACO Right 05/17/2017   Procedure: CATARACT EXTRACTION PHACO AND INTRAOCULAR LENS PLACEMENT ([place]) Right diabetic;  Surgeon: Eulogio Bear, MD;  Location: [place];  Service: Ophthalmology;  Laterality: Right;   CHOLECYSTECTOMY     COLONOSCOPY WITH PROPOFOL N/A 11/14/2017   Procedure: COLONOSCOPY WITH PROPOFOL;  Surgeon: Lollie Sails, MD;  Location: [facility] END[facility]COPY;  Service: Endoscopy;  Laterality: N/A;   EYE SURGERY     cataracts with extraocular prosthesis   KIDNEY SURGERY     gave daughter a kidney   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   Subjective Assessment - 02/20/22 1444     Subjective Pt. arrived to PT with new rollator and is happy with how it fits/ works.  Pt. able to safely get rollator in/out of car with no issues.  No falls reported.  Pt. reports varying L LE disomfort and states her discomfort is primarily in L lower leg today.    Pertinent History Pt. currently retired. Pt. lives alone and has 6 steps with railing entering the house. Pt. has  a good demeanor but does mention she is currently taking care of her brother who is experiencing falls. Pt. loves to sew and walk. Pt. says she has not fallen but did have a close call where she had to catch herself recently. Pt. has a SPC but rarely uses it and states she is aware it would be helpful to use it more consistantly.    Limitations Walking;House hold activities    Patient Stated Goals Pt. wants to get rid of L LE pain and improve safety/balance with walking.  Patient Stated Goals  Pt. wants to get rid of L LE pain and improve safety/balance with walking.    Currently in Pain? Yes    Pain Score 4     Pain Location Leg    Pain Orientation Left              Ther.ex.:   Nustep L3-4 10 min. B UE/LE     Seated/ standing 4# ex.: marching/ LAQ/ standing marching/ hip abduction/ hip extension/ knee flexion/ standing toe and heel raises 20x.     Supine B LE hamstring/ hip/ gastroc stretches 3x each with 30 sec. Holds. Pt. Muscle guards L LE during stretch.      Neuro. Mm.:   Walking in //-bars high marching. Pharmacist, hospital.   Resisted gait 2BTB 5x all -planes (SBA/CGA for safety).   Walking in //-bars forward/backward/lateral. Pharmacist, hospital. Pt. Has multiple LOB but able to self-correct in //-bars with UE assist.   Tandem gait with light to no UE assist.     Amb. In hallway with no assistive device and consistent cadence working on arm swing/ step pattern.   Added ball toss/ turning with ball toss and lateral walking with ball toss (70 feet x 2).     Walking to car with CGA and new rollator.  PT properly fitted pt. For rollator and assessed brakes.             PT Long Term Goals - 01/21/22 1007       PT LONG TERM GOAL #1   Title Pt. will improve FOTO score to 62 to improve pain free functional mobility.    Baseline 01/21/2022: 51    Time 8    Period Weeks    Status New    Target Date 03/18/22      PT LONG TERM GOAL #2   Title Pt. will improve Berg balance score to >46/56 to decrease fall risk/ improve mod. independence with gait.    Baseline 1/26: 40/56    Time 8    Period Weeks    Status New    Target Date 03/18/22      PT LONG TERM GOAL #3   Title Pt. will be able to ambulate on uneven terrain without LOB to perform community ADL's.    Baseline 1/26: Pt. experiences LOB when walking on uneven terrain (grass) and needed mod A from PT. to regain balance.    Time 8    Period Weeks    Status New    Target Date 03/18/22      PT LONG TERM GOAL #4   Title Pt. will be able to balance in tandem stance for >30 seconds without CGA to increase balance and improve independence with ADLs.    Baseline 1/26: Pt. unable to balance for >10 seconds without LOB    Time 8    Period  Weeks    Status New    Target Date 03/18/22  Plan - 02/20/22 1449     Clinical Impression Statement Pt. able to complete higher level dynamic tasks in hallway with no LOB.  Pt. continues to require extra time/ focus during multitasks with arm swing/ ball toss.  No LOB during tx. session and PT provided SBA/CGA t/o for safety and cuing.  No increase c/o L LE pain during ex. and good hip/hamstring flexibility with supine stretches.  Pt. will continue to use rollator with community ambulation and understands the importance using brakes when sitting/ standing from rollator.  No change to HEP.    Personal Factors and Comorbidities Time since onset of injury/illness/exacerbation    Examination-Activity Limitations Locomotion Level;Bend;Stairs    Stability/Clinical Decision Making Evolving/Moderate complexity    Clinical Decision Making Moderate    Rehab Potential Good    PT Frequency 2x / week    PT Duration 8 weeks    PT Treatment/Interventions ADLs/Self Care Home Management;Cryotherapy;Gait training;Stair training;Functional mobility training;Therapeutic activities;Therapeutic exercise;Neuromuscular re-education;Balance training;Patient/family education;Manual techniques;Passive range of motion    PT Next Visit Plan Dynamic balance activities. L LE strengthening.   10th visit progress note.    PT Home Exercise Plan [id number]    Consulted and Agree with Plan of Care Patient             Patient will benefit from skilled therapeutic intervention in order to improve the following deficits and impairments:  Abnormal gait, Decreased balance, Decreased activity tolerance, Decreased endurance, Decreased mobility, Decreased strength, Impaired flexibility, Difficulty walking, Decreased coordination, Hypomobility, Decreased range of motion, Decreased safety awareness  Visit Diagnosis: Imbalance  Gait difficulty  Muscle weakness of lower extremity  Pain in left  leg     Problem List 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   Pessary maintenance 01/11/2018   Vaginal polyp 12/12/2017   Vaginal atrophy 12/12/2017   Cystocele, midline 12/12/2017   Chronic diarrhea 09/15/2017   Chronic renal impairment, 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   Alzheimer's dementia without behavioral disturbance ([place]) 09/30/2016   Mixed incontinence 03/23/2016   B12 deficiency 02/03/2016   High risk medication use 01/15/2016   Primary insomnia 01/15/2016   Vitamin D deficiency, unspecified 01/15/2016   Microalbuminuria 09/23/2015   GERD without esophagitis 05/02/2015   History of cervical cancer 05/02/2015   History of hypothyroidism 05/02/2015   Urge incontinence 05/02/2015   Prediabetes 09/20/2014   Constipation by delayed colonic transit 08/30/2013   Rectocele 08/30/2013   Essential hypertension 07/02/2013   Pura Spice, PT, DPT # [phone] 02/20/2022, 2:54 PM  [facility] [address]. [place], [place], [address] Phone: [phone]   Fax:  [phone]  Name: BRENNLEY CURTICE MRN: [id number] Date of Birth: 03/07/1941

## 2022-02-23 ENCOUNTER — Encounter: Payer: Self-pay | Admitting: Physical Therapy

## 2022-02-23 ENCOUNTER — Ambulatory Visit: Payer: Medicare HMO | Admitting: Physical Therapy

## 2022-02-23 ENCOUNTER — Other Ambulatory Visit: Payer: Self-pay

## 2022-02-23 DIAGNOSIS — R269 Unspecified abnormalities of gait and mobility: Secondary | ICD-10-CM | POA: Diagnosis not present

## 2022-02-23 DIAGNOSIS — R2689 Other abnormalities of gait and mobility: Secondary | ICD-10-CM | POA: Diagnosis not present

## 2022-02-23 DIAGNOSIS — M6281 Muscle weakness (generalized): Secondary | ICD-10-CM

## 2022-02-23 DIAGNOSIS — M79605 Pain in left leg: Secondary | ICD-10-CM

## 2022-02-23 NOTE — Therapy (Signed)
[facility] [address] [place], [place], [address] Phone: [phone]   Fax:  [phone]  Physical Therapy Treatment Physical Therapy Progress Note   Dates of reporting period  01/21/2022  to  02/23/2022  Patient Details  Name: YSELA HETTINGER MRN: [id number] Date of Birth: [birth year] Referring Provider (PT): Dr. Army Melia   Encounter Date: 02/23/2022   PT End of Session - 02/23/22 0823     Visit Number 10    Number of Visits 16    Date for PT Re-Evaluation 03/18/22    Authorization - Visit Number 10    Authorization - Number of Visits 10    PT Start Time 0413    PT Stop Time 0846    PT Time Calculation (min) 48 min    Equipment Utilized During Treatment Gait belt    Activity Tolerance Patient tolerated treatment well    Behavior During Therapy WFL for tasks assessed/performed             Past Medical History:  Diagnosis Date   Anxiety and depression    Arthritis    Broken ankle    Cancer ([place])    skin ca   Depression    Diabetes mellitus without complication ([place])    GERD (gastroesophageal reflux disease)    Gout    Gout    Heart murmur    Hypercholesteremia    Hypertension    Mixed incontinence    Pneumonia    Right lower lobe pneumonia   Thyroid condition    Type 2 diabetes mellitus with diabetic polyneuropathy ([place]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   Wears dentures    full upper and lower    Past Surgical History:  Procedure Laterality Date   ABDOMINAL HYSTERECTOMY     APPENDECTOMY  1993   BROW LIFT Bilateral 02/27/2019   Procedure: BLEPHAROPLASTY UPPER EYELID W/EXCESS SKIN;  Surgeon: Karle Starch, MD;  Location: [place];  Service: Ophthalmology;  Laterality: Bilateral;   BROW PTOSIS Bilateral 02/27/2019   Procedure: BROW PTOSIS REPAIR;  Surgeon: Karle Starch, MD;  Location: [place];  Service: Ophthalmology;  Laterality: Bilateral;  Diabetic - oral meds   CATARACT EXTRACTION W/PHACO Left 03/22/2017    Procedure: CATARACT EXTRACTION PHACO AND INTRAOCULAR LENS PLACEMENT ([place]) left diabetic;  Surgeon: Eulogio Bear, MD;  Location: [place];  Service: Ophthalmology;  Laterality: Left;  Diabetic   CATARACT EXTRACTION W/PHACO Right 05/17/2017   Procedure: CATARACT EXTRACTION PHACO AND INTRAOCULAR LENS PLACEMENT ([place]) Right diabetic;  Surgeon: Eulogio Bear, MD;  Location: [place];  Service: Ophthalmology;  Laterality: Right;   CHOLECYSTECTOMY     COLONOSCOPY WITH PROPOFOL N/A 11/14/2017   Procedure: COLONOSCOPY WITH PROPOFOL;  Surgeon: Lollie Sails, MD;  Location: [facility] END[facility]COPY;  Service: Endoscopy;  Laterality: N/A;   EYE SURGERY     cataracts with extraocular prosthesis   KIDNEY SURGERY     gave daughter a kidney   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   Subjective Assessment - 02/23/22 0821     Subjective Pt. states she had a difficult weekend because brother went back to hospital at the end of last week.  Pt. states she had to take care of brother's cat/ errands.  Pt. did not fall but was tired out.  Pt. reports she had difficulty lifting cat litter/ milk and picking up food for brother.  Pertinent History Pt. currently retired. Pt. lives alone and has 6 steps with railing entering the house. Pt. has a good demeanor but does mention she is currently taking care of her brother who is experiencing falls. Pt. loves to sew and walk. Pt. says she has not fallen but did have a close call where she had to catch herself recently. Pt. has a SPC but rarely uses it and states she is aware it would be helpful to use it more consistantly.    Limitations Walking;House hold activities    Patient Stated Goals Pt. wants to get rid of L LE pain and improve safety/balance with walking.  Patient Stated Goals  Pt. wants to get rid of L LE pain and improve safety/balance with walking.    Currently in Pain? Yes   no  subjective pain score given   Pain Location Leg    Pain Orientation Left               Ther.ex.:   Nustep L3 10 min. B UE/LE (MH to low back during bike).    Seated/ standing 4# ex.: marching/ LAQ/ standing marching/ hip abduction/ hip extension/ knee flexion/ standing toe and heel raises 20x.     Supine B LE hamstring/ hip/ gastroc stretches 3x each with 30 sec. Holds.      Neuro. Mm.:   Berg balance test: 44/56.  Difficulty with tandem stance/ SLS/ step touches.    Walking in //-bars high marching. Pharmacist, hospital.   Resisted gait 2BTB 5x all -planes (SBA/CGA for safety).  Walking in hallway with CGA: carrying bag (5#)/ bilateral box carry (10#, attempted 15# with difficulty noted).     Walking in //-bars forward/backward/lateral. Pharmacist, hospital. Pt. Has multiple LOB but able to self-correct in //-bars with UE assist.   Tandem gait with light to no UE assist.     Amb. In hallway with no assistive device and consistent cadence working on arm swing/ step pattern.   Walking outside to car with no assistive device/ curb/ sidewalk.  Cuing for posture correction.           PT Long Term Goals - 02/23/22 3545       PT LONG TERM GOAL #1   Title Pt. will improve FOTO score to 62 to improve pain free functional mobility.    Baseline 01/21/2022: 51.  2/28: 70    Time 8    Period Weeks    Status Achieved    Target Date 02/23/22      PT LONG TERM GOAL #2   Title Pt. will improve Berg balance score to >46/56 to decrease fall risk/ improve mod. independence with gait.    Baseline 1/26: 40/56.  2/28: 44/56    Time 8    Period Weeks    Status Partially Met    Target Date 03/18/22      PT LONG TERM GOAL #3   Title Pt. will be able to ambulate on uneven terrain without LOB to perform community ADL's.    Baseline 1/26: Pt. experiences LOB when walking on uneven terrain (grass) and needed mod A from PT. to regain balance.    Time 8    Period Weeks    Status Partially Met     Target Date 03/18/22      PT LONG TERM GOAL #4   Title Pt. will be able to balance in tandem stance for >30 seconds without CGA to increase balance and improve independence with ADLs.  Baseline 1/26: Pt. unable to balance for >10 seconds without LOB    Time 8    Period Weeks    Status Not Met    Target Date 03/18/22                   Plan - 02/23/22 0951     Clinical Impression Statement PT tx. session focused on LE strengthening and functional balance tasks while carrying box.  Pt. limited with carrying >10# due to balance/ limitations and increase L LE discomfort.  Pt. showed marked increase in FOTO score and Berg balance assessment.  Pt. works hard during tx. session and ambulates on indoor/ outdoor terrain without assistive device and CGA for safety/ cuing.  No change to HEP.    Personal Factors and Comorbidities Time since onset of injury/illness/exacerbation    Examination-Activity Limitations Locomotion Level;Bend;Stairs    Stability/Clinical Decision Making Evolving/Moderate complexity    Clinical Decision Making Moderate    Rehab Potential Good    PT Frequency 2x / week    PT Duration 8 weeks    PT Treatment/Interventions ADLs/Self Care Home Management;Cryotherapy;Gait training;Stair training;Functional mobility training;Therapeutic activities;Therapeutic exercise;Neuromuscular re-education;Balance training;Patient/family education;Manual techniques;Passive range of motion    PT Next Visit Plan Dynamic balance activities. L LE strengthening.    PT Home Exercise Plan [id number]    Consulted and Agree with Plan of Care Patient             Patient will benefit from skilled therapeutic intervention in order to improve the following deficits and impairments:  Abnormal gait, Decreased balance, Decreased activity tolerance, Decreased endurance, Decreased mobility, Decreased strength, Impaired flexibility, Difficulty walking, Decreased coordination, Hypomobility, Decreased  range of motion, Decreased safety awareness  Visit Diagnosis: Imbalance  Gait difficulty  Muscle weakness of lower extremity  Pain in left leg     Problem List 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   Pessary maintenance 01/11/2018   Vaginal polyp 12/12/2017   Vaginal atrophy 12/12/2017   Cystocele, midline 12/12/2017   Chronic diarrhea 09/15/2017   Chronic renal impairment, 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   Alzheimer's dementia without behavioral disturbance ([place]) 09/30/2016   Mixed incontinence 03/23/2016   B12 deficiency 02/03/2016   High risk medication use 01/15/2016   Primary insomnia 01/15/2016   Vitamin D deficiency, unspecified 01/15/2016   Microalbuminuria 09/23/2015   GERD without esophagitis 05/02/2015   History of cervical cancer 05/02/2015   History of hypothyroidism 05/02/2015   Urge incontinence 05/02/2015   Prediabetes 09/20/2014   Constipation by delayed colonic transit 08/30/2013   Rectocele 08/30/2013   Essential hypertension 07/02/2013   Pura Spice, PT, DPT # [phone] 02/23/2022, 10:28 AM  [facility] [address] [place], [place], [address] Phone: [phone]   Fax:  [phone]  Name: CHIQUITTA MATTY MRN: [id number] Date of Birth: Jun 30, 1941

## 2022-02-25 ENCOUNTER — Other Ambulatory Visit: Payer: Self-pay

## 2022-02-25 ENCOUNTER — Encounter: Payer: Self-pay | Admitting: Physical Therapy

## 2022-02-25 ENCOUNTER — Ambulatory Visit: Payer: Medicare HMO | Attending: Internal Medicine | Admitting: Physical Therapy

## 2022-02-25 DIAGNOSIS — M6281 Muscle weakness (generalized): Secondary | ICD-10-CM

## 2022-02-25 DIAGNOSIS — R269 Unspecified abnormalities of gait and mobility: Secondary | ICD-10-CM | POA: Diagnosis not present

## 2022-02-25 DIAGNOSIS — M79605 Pain in left leg: Secondary | ICD-10-CM | POA: Diagnosis not present

## 2022-02-25 DIAGNOSIS — R2689 Other abnormalities of gait and mobility: Secondary | ICD-10-CM

## 2022-02-25 NOTE — Therapy (Addendum)
[place] [facility] [address] [place], [place], [address] Phone: [phone]   Fax:  [phone]  Physical Therapy Treatment  Patient Details  Name: Candice Cook MRN: [id number] Date of Birth: 03-06-1941 Referring Provider (PT): Dr. Army Melia   Encounter Date: 02/25/2022   PT End of Session - 02/25/22 0758     Visit Number 11    Number of Visits 16    Date for PT Re-Evaluation 03/18/22    Authorization - Visit Number 1    Authorization - Number of Visits 10    PT Start Time 7628    PT Stop Time 0850    PT Time Calculation (min) 52 min    Equipment Utilized During Treatment Gait belt    Activity Tolerance Patient tolerated treatment well    Behavior During Therapy WFL for tasks assessed/performed             Past Medical History:  Diagnosis Date   Anxiety and depression    Arthritis    Broken ankle    Cancer ([place])    skin ca   Depression    Diabetes mellitus without complication ([place])    GERD (gastroesophageal reflux disease)    Gout    Gout    Heart murmur    Hypercholesteremia    Hypertension    Mixed incontinence    Pneumonia    Right lower lobe pneumonia   Thyroid condition    Type 2 diabetes mellitus with diabetic polyneuropathy ([place]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   Wears dentures    full upper and lower    Past Surgical History:  Procedure Laterality Date   ABDOMINAL HYSTERECTOMY     APPENDECTOMY  1993   BROW LIFT Bilateral 02/27/2019   Procedure: BLEPHAROPLASTY UPPER EYELID W/EXCESS SKIN;  Surgeon: Karle Starch, MD;  Location: [place];  Service: Ophthalmology;  Laterality: Bilateral;   BROW PTOSIS Bilateral 02/27/2019   Procedure: BROW PTOSIS REPAIR;  Surgeon: Karle Starch, MD;  Location: [place];  Service: Ophthalmology;  Laterality: Bilateral;  Diabetic - oral meds   CATARACT EXTRACTION W/PHACO Left 03/22/2017   Procedure: CATARACT EXTRACTION PHACO AND INTRAOCULAR LENS PLACEMENT ([place]) left diabetic;   Surgeon: Eulogio Bear, MD;  Location: [place];  Service: Ophthalmology;  Laterality: Left;  Diabetic   CATARACT EXTRACTION W/PHACO Right 05/17/2017   Procedure: CATARACT EXTRACTION PHACO AND INTRAOCULAR LENS PLACEMENT ([place]) Right diabetic;  Surgeon: Eulogio Bear, MD;  Location: [place];  Service: Ophthalmology;  Laterality: Right;   CHOLECYSTECTOMY     COLONOSCOPY WITH PROPOFOL N/A 11/14/2017   Procedure: COLONOSCOPY WITH PROPOFOL;  Surgeon: Lollie Sails, MD;  Location: [facility];  Service: Endoscopy;  Laterality: N/A;   EYE SURGERY     cataracts with extraocular prosthesis   KIDNEY SURGERY     gave daughter a kidney   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   Subjective Assessment - 02/25/22 0802     Subjective Pt. arrives to clinic with no pain in L leg; she feels her pain only increase with activity. Pt. uses Rollator only for community distances and has had no falls in the past week.    Pertinent History Pt. currently retired. Pt. lives alone and has 6 steps with railing entering the house. Pt. has a good demeanor but does mention she is currently taking care of her brother who is  experiencing falls. Pt. loves to sew and walk. Pt. says she has not fallen but did have a close call where she had to catch herself recently. Pt. has a SPC but rarely uses it and states she is aware it would be helpful to use it more consistantly.    Limitations Walking;House hold activities    Patient Stated Goals Pt. wants to get rid of L LE pain and improve safety/balance with walking.  Patient Stated Goals  Pt. wants to get rid of L LE pain and improve safety/balance with walking.    Currently in Pain? No/denies    Pain Score 0-No pain    Pain Location Leg    Pain Orientation Left              Ther.ex.:   Nustep L3 8 min. B UE/LE   Seated 4# ex.: marching/ LAQ/ toe and heel raises 20x.      Supine B LE hamstring/ hip/ gastroc stretches 3x each with 30 sec. Holds.   Supine Heel slides 20x each   //-bars NBOS balance on airex pad with EO/EC 2x20 seconds  //-bars tandem stance balance. 2x20 seconds each. Pt. Has LOB with left foot behind     Neuro. Mm.:   Walking in //-bars high marching. Pharmacist, hospital.   Resisted gait 2BTB 5x all -planes (SBA/CGA for safety).   Walking in hallway with CGA: carrying bag (5#). Pt. Alternates UE carry   4 square step test. 12.98 seconds     Amb. In hallway with no assistive device and consistent cadence working on arm swing/ step pattern.   Walking outside to car with no assistive device/ curb/ sidewalk.  Cuing for posture correction.        PT Long Term Goals - 02/23/22 6962       PT LONG TERM GOAL #1   Title Pt. will improve FOTO score to 62 to improve pain free functional mobility.    Baseline 01/21/2022: 51.  2/28: 70    Time 8    Period Weeks    Status Achieved    Target Date 02/23/22      PT LONG TERM GOAL #2   Title Pt. will improve Berg balance score to >46/56 to decrease fall risk/ improve mod. independence with gait.    Baseline 1/26: 40/56.  2/28: 44/56    Time 8    Period Weeks    Status Partially Met    Target Date 03/18/22      PT LONG TERM GOAL #3   Title Pt. will be able to ambulate on uneven terrain without LOB to perform community ADL's.    Baseline 1/26: Pt. experiences LOB when walking on uneven terrain (grass) and needed mod A from PT. to regain balance.    Time 8    Period Weeks    Status Partially Met    Target Date 03/18/22      PT LONG TERM GOAL #4   Title Pt. will be able to balance in tandem stance for >30 seconds without CGA to increase balance and improve independence with ADLs.    Baseline 1/26: Pt. unable to balance for >10 seconds without LOB    Time 8    Period Weeks    Status Not Met    Target Date 03/18/22                 Plan - 02/25/22 1000     Clinical Impression  Statement Pt. is able to  continue to work on LE strengthening and functional balance tasks such as 4 square step test and 5# bag carry in hallway. Pt. has minor LOB throughout session and requires mod A. from PT. Pt. is educated on benefits of exercise to help with her osteoporosis and PT updated her HEP to work on LE strength and balance. Pt. will become more independent with her HEP to decrease her fall risk as she is approaching D/C.    Personal Factors and Comorbidities Time since onset of injury/illness/exacerbation    Examination-Activity Limitations Locomotion Level;Bend;Stairs    Stability/Clinical Decision Making Evolving/Moderate complexity    Clinical Decision Making Moderate    Rehab Potential Good    PT Frequency 2x / week    PT Duration 8 weeks    PT Treatment/Interventions ADLs/Self Care Home Management;Cryotherapy;Gait training;Stair training;Functional mobility training;Therapeutic activities;Therapeutic exercise;Neuromuscular re-education;Balance training;Patient/family education;Manual techniques;Passive range of motion    PT Next Visit Plan Dynamic balance activities. L LE strengthening.  2 more tx. sessions.    PT Home Exercise Plan [email or web address]    Consulted and Agree with Plan of Care Patient             Patient will benefit from skilled therapeutic intervention in order to improve the following deficits and impairments:  Abnormal gait, Decreased balance, Decreased activity tolerance, Decreased endurance, Decreased mobility, Decreased strength, Impaired flexibility, Difficulty walking, Decreased coordination, Hypomobility, Decreased range of motion, Decreased safety awareness  Visit Diagnosis: Imbalance  Gait difficulty  Muscle weakness of lower extremity  Pain in left leg     Problem List Patient Active Problem List   Diagnosis Date Noted   Gouty arthritis of both feet 01/15/2022   Mixed hyperlipidemia 01/15/2022   Gait abnormality 01/15/2022   Chronic  left-sided low back pain with left-sided sciatica 04/21/2020   Foraminal stenosis of lumbar region 04/21/2020   Lumbar degenerative disc disease 03/20/2020   Osteoporosis, 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   Pessary maintenance 01/11/2018   Vaginal polyp 12/12/2017   Vaginal atrophy 12/12/2017   Cystocele, midline 12/12/2017   Chronic diarrhea 09/15/2017   Chronic renal impairment, 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   Alzheimer's dementia without behavioral disturbance ([place]) 09/30/2016   Mixed incontinence 03/23/2016   B12 deficiency 02/03/2016   High risk medication use 01/15/2016   Primary insomnia 01/15/2016   Vitamin D deficiency, unspecified 01/15/2016   Microalbuminuria 09/23/2015   GERD without esophagitis 05/02/2015   History of cervical cancer 05/02/2015   History of hypothyroidism 05/02/2015   Urge incontinence 05/02/2015   Prediabetes 09/20/2014   Constipation by delayed colonic transit 08/30/2013   Rectocele 08/30/2013   Essential hypertension 07/02/2013   Pura Spice, PT, DPT # [id number] Cleopatra Cedar, SPT 02/25/2022, 10:39 AM  [facility] [address]. [place], [place], [address] Phone: [phone]   Fax:  [phone]  Name: CANIYAH MURLEY MRN: [id number] Date of Birth: 1941-04-07

## 2022-02-25 NOTE — Patient Instructions (Signed)
Access Code: [email or web address] ?URL: [email or web address] ?Date: 02/25/2022 ?Prepared by: Dorcas Carrow ? ?Exercises ?Supine Hamstring Stretch with Strap - 1 x daily - 3 x weekly - 3 sets - 3 reps - 20-30 hold ?Supine Heel Slide - 1 x daily - 3 x weekly - 3 sets - 10 reps ?Supine Bridge - 1 x daily - 3 x weekly - 2 sets - 10 reps ?Standing Tandem Balance with Counter Support - 1 x daily - 3 x weekly - 3 sets - 3 reps - 10-20 hold ?Standing Knee Flexion with Counter Support - 1 x daily - 3 x weekly - 3 sets - 10 reps ?

## 2022-03-02 ENCOUNTER — Encounter: Payer: Self-pay | Admitting: Physical Therapy

## 2022-03-02 ENCOUNTER — Ambulatory Visit: Payer: Medicare HMO | Admitting: Physical Therapy

## 2022-03-02 ENCOUNTER — Other Ambulatory Visit: Payer: Self-pay

## 2022-03-02 DIAGNOSIS — M6281 Muscle weakness (generalized): Secondary | ICD-10-CM

## 2022-03-02 DIAGNOSIS — R2689 Other abnormalities of gait and mobility: Secondary | ICD-10-CM | POA: Diagnosis not present

## 2022-03-02 DIAGNOSIS — M79605 Pain in left leg: Secondary | ICD-10-CM

## 2022-03-02 DIAGNOSIS — R269 Unspecified abnormalities of gait and mobility: Secondary | ICD-10-CM | POA: Diagnosis not present

## 2022-03-02 NOTE — Therapy (Signed)
[facility] [address] [place], [place], [address] Phone: [phone]   Fax:  [phone]  Physical Therapy Treatment  Patient Details  Name: Candice Cook MRN: [id number] Date of Birth: August 10, 1941 Referring Provider (PT): Dr. Army Melia   Encounter Date: 03/02/2022   PT End of Session - 03/02/22 0818     Visit Number 12    Number of Visits 16    Date for PT Re-Evaluation 03/18/22    Authorization - Visit Number 2    Authorization - Number of Visits 10    PT Start Time 0811    PT Stop Time 0903    PT Time Calculation (min) 52 min    Equipment Utilized During Treatment Gait belt    Activity Tolerance Patient tolerated treatment well    Behavior During Therapy WFL for tasks assessed/performed             Past Medical History:  Diagnosis Date   Anxiety and depression    Arthritis    Broken ankle    Cancer ([place])    skin ca   Depression    Diabetes mellitus without complication ([place])    GERD (gastroesophageal reflux disease)    Gout    Gout    Heart murmur    Hypercholesteremia    Hypertension    Mixed incontinence    Pneumonia    Right lower lobe pneumonia   Thyroid condition    Type 2 diabetes mellitus with diabetic polyneuropathy ([place]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   Wears dentures    full upper and lower    Past Surgical History:  Procedure Laterality Date   ABDOMINAL HYSTERECTOMY     APPENDECTOMY  1993   BROW LIFT Bilateral 02/27/2019   Procedure: BLEPHAROPLASTY UPPER EYELID W/EXCESS SKIN;  Surgeon: Karle Starch, MD;  Location: [place];  Service: Ophthalmology;  Laterality: Bilateral;   BROW PTOSIS Bilateral 02/27/2019   Procedure: BROW PTOSIS REPAIR;  Surgeon: Karle Starch, MD;  Location: [place];  Service: Ophthalmology;  Laterality: Bilateral;  Diabetic - oral meds   CATARACT EXTRACTION W/PHACO Left 03/22/2017   Procedure: CATARACT EXTRACTION PHACO AND INTRAOCULAR LENS PLACEMENT ([place]) left diabetic;   Surgeon: Eulogio Bear, MD;  Location: [place];  Service: Ophthalmology;  Laterality: Left;  Diabetic   CATARACT EXTRACTION W/PHACO Right 05/17/2017   Procedure: CATARACT EXTRACTION PHACO AND INTRAOCULAR LENS PLACEMENT ([place]) Right diabetic;  Surgeon: Eulogio Bear, MD;  Location: [place];  Service: Ophthalmology;  Laterality: Right;   CHOLECYSTECTOMY     COLONOSCOPY WITH PROPOFOL N/A 11/14/2017   Procedure: COLONOSCOPY WITH PROPOFOL;  Surgeon: Lollie Sails, MD;  Location: [facility] END[facility]COPY;  Service: Endoscopy;  Laterality: N/A;   EYE SURGERY     cataracts with extraocular prosthesis   KIDNEY SURGERY     gave daughter a kidney   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   Subjective Assessment - 03/02/22 0817     Subjective Pt. reports no L LE pain this morning.  Pt. states she wasn't too active this past weekend.  Pt. is planning to mow grass this afternoon.    Pertinent History Pt. currently retired. Pt. lives alone and has 6 steps with railing entering the house. Pt. has a good demeanor but does mention she is currently taking care of her brother who is experiencing falls. Pt. loves to sew and  walk. Pt. says she has not fallen but did have a close call where she had to catch herself recently. Pt. has a SPC but rarely uses it and states she is aware it would be helpful to use it more consistantly.    Limitations Walking;House hold activities    Patient Stated Goals Pt. wants to get rid of L LE pain and improve safety/balance with walking.  Patient Stated Goals  Pt. wants to get rid of L LE pain and improve safety/balance with walking.    Currently in Pain? No/denies             Ther.ex.:   Nustep L4 10 min. B UE/LE   Supine B LE hamstring/ hip/ gastroc stretches 3x each with 30 sec. Holds.    Supine ball ex.: knee to chest/ bridging/ rotn. 10x2 each.  Supine SLR 10x2.       Neuro.  Mm.:  Airex step ups: forward/lateral in //-bars 5 laps.      Walking in hallway with varying cadence and SBA/CGA 4 laps in hallway.  Tandem gait forward/backwards (CGA/min. A for safety)- mirror feedback.    Walking in hallway with unilateral carry of 5# bag with CGA.  Ascending/ descending stairs while carrying 5# bag (light UE assist on handrail).  Pt. Alternates UE carry   Amb. In hallway with no assistive device and consistent cadence working on arm swing/ step pattern.   Walking outside to car with no assistive device/ curb/ sidewalk.  Cuing for posture correction.            PT Long Term Goals - 02/23/22 2956       PT LONG TERM GOAL #1   Title Pt. will improve FOTO score to 62 to improve pain free functional mobility.    Baseline 01/21/2022: 51.  2/28: 70    Time 8    Period Weeks    Status Achieved    Target Date 02/23/22      PT LONG TERM GOAL #2   Title Pt. will improve Berg balance score to >46/56 to decrease fall risk/ improve mod. independence with gait.    Baseline 1/26: 40/56.  2/28: 44/56    Time 8    Period Weeks    Status Partially Met    Target Date 03/18/22      PT LONG TERM GOAL #3   Title Pt. will be able to ambulate on uneven terrain without LOB to perform community ADL's.    Baseline 1/26: Pt. experiences LOB when walking on uneven terrain (grass) and needed mod A from PT. to regain balance.    Time 8    Period Weeks    Status Partially Met    Target Date 03/18/22      PT LONG TERM GOAL #4   Title Pt. will be able to balance in tandem stance for >30 seconds without CGA to increase balance and improve independence with ADLs.    Baseline 1/26: Pt. unable to balance for >10 seconds without LOB    Time 8    Period Weeks    Status Not Met    Target Date 03/18/22                   Plan - 03/02/22 0818     Clinical Impression Statement Minimal L LE pain symptoms during dynamic balance/ gait activities in hallway.  No LOB during tx. but  pt. requires light UE assist during tandem gait/ ascending stairs while carrying  bag.  PT discussed discharging pt. next tx. session with greater focus on home program/ stretches.  Pt. will continue to benefit from use of rollator when ambulating increase distances outside.    Personal Factors and Comorbidities Time since onset of injury/illness/exacerbation    Examination-Activity Limitations Locomotion Level;Bend;Stairs    Stability/Clinical Decision Making Evolving/Moderate complexity    Clinical Decision Making Moderate    Rehab Potential Good    PT Frequency 2x / week    PT Duration 8 weeks    PT Treatment/Interventions ADLs/Self Care Home Management;Cryotherapy;Gait training;Stair training;Functional mobility training;Therapeutic activities;Therapeutic exercise;Neuromuscular re-education;Balance training;Patient/family education;Manual techniques;Passive range of motion    PT Next Visit Plan Dynamic balance activities. L LE strengthening.  1 more tx. sessions./probable discharge next tx. session    PT Home Exercise Plan [email or web address]    Consulted and Agree with Plan of Care Patient             Patient will benefit from skilled therapeutic intervention in order to improve the following deficits and impairments:  Abnormal gait, Decreased balance, Decreased activity tolerance, Decreased endurance, Decreased mobility, Decreased strength, Impaired flexibility, Difficulty walking, Decreased coordination, Hypomobility, Decreased range of motion, Decreased safety awareness  Visit Diagnosis: Imbalance  Gait difficulty  Muscle weakness of lower extremity  Pain in left leg     Problem List Patient Active Problem List   Diagnosis Date Noted   Gouty arthritis of both feet 01/15/2022   Mixed hyperlipidemia 01/15/2022   Gait abnormality 01/15/2022   Chronic left-sided low back pain with left-sided sciatica 04/21/2020   Foraminal stenosis of lumbar region 04/21/2020   Lumbar degenerative  disc disease 03/20/2020   Osteoporosis, 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   Pessary maintenance 01/11/2018   Vaginal polyp 12/12/2017   Vaginal atrophy 12/12/2017   Cystocele, midline 12/12/2017   Chronic diarrhea 09/15/2017   Chronic renal impairment, 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   Alzheimer's dementia without behavioral disturbance ([place]) 09/30/2016   Mixed incontinence 03/23/2016   B12 deficiency 02/03/2016   High risk medication use 01/15/2016   Primary insomnia 01/15/2016   Vitamin D deficiency, unspecified 01/15/2016   Microalbuminuria 09/23/2015   GERD without esophagitis 05/02/2015   History of cervical cancer 05/02/2015   History of hypothyroidism 05/02/2015   Urge incontinence 05/02/2015   Prediabetes 09/20/2014   Constipation by delayed colonic transit 08/30/2013   Rectocele 08/30/2013   Essential hypertension 07/02/2013   Pura Spice, PT, DPT # [id number] 03/02/2022, 10:05 AM  [facility] [address] [place], [place], [address] Phone: [phone]   Fax:  [phone]  Name: BRYNDLE CORREDOR MRN: [id number] Date of Birth: Jan 24, 1941

## 2022-03-04 ENCOUNTER — Other Ambulatory Visit: Payer: Self-pay

## 2022-03-04 ENCOUNTER — Encounter: Payer: Self-pay | Admitting: Physical Therapy

## 2022-03-04 ENCOUNTER — Ambulatory Visit: Payer: Medicare HMO | Admitting: Physical Therapy

## 2022-03-04 DIAGNOSIS — R2689 Other abnormalities of gait and mobility: Secondary | ICD-10-CM

## 2022-03-04 DIAGNOSIS — M6281 Muscle weakness (generalized): Secondary | ICD-10-CM | POA: Diagnosis not present

## 2022-03-04 DIAGNOSIS — M79605 Pain in left leg: Secondary | ICD-10-CM | POA: Diagnosis not present

## 2022-03-04 DIAGNOSIS — R269 Unspecified abnormalities of gait and mobility: Secondary | ICD-10-CM | POA: Diagnosis not present

## 2022-03-04 NOTE — Therapy (Signed)
[facility] [address] [place], [place], [address] Phone: [phone]   Fax:  [phone]  Physical Therapy Treatment/Discharge  Patient Details  Name: Candice Cook MRN: [id number] Date of Birth: Dec 28, 1940 Referring Provider (PT): Dr. Army Melia   Encounter Date: 03/04/2022   PT End of Session - 03/04/22 0813     Visit Number 13    Number of Visits 16    Date for PT Re-Evaluation 03/18/22    Authorization - Visit Number 3    Authorization - Number of Visits 10    PT Start Time 0805    PT Stop Time 9735    PT Time Calculation (min) 44 min    Equipment Utilized During Treatment Gait belt    Activity Tolerance Patient tolerated treatment well    Behavior During Therapy WFL for tasks assessed/performed             Past Medical History:  Diagnosis Date   Anxiety and depression    Arthritis    Broken ankle    Cancer ([place])    skin ca   Depression    Diabetes mellitus without complication ([place])    GERD (gastroesophageal reflux disease)    Gout    Gout    Heart murmur    Hypercholesteremia    Hypertension    Mixed incontinence    Pneumonia    Right lower lobe pneumonia   Thyroid condition    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   Wears dentures    full upper and lower    Past Surgical History:  Procedure Laterality Date   ABDOMINAL HYSTERECTOMY     APPENDECTOMY  1993   BROW LIFT Bilateral 02/27/2019   Procedure: BLEPHAROPLASTY UPPER EYELID W/EXCESS SKIN;  Surgeon: Karle Starch, MD;  Location: [place];  Service: Ophthalmology;  Laterality: Bilateral;   BROW PTOSIS Bilateral 02/27/2019   Procedure: BROW PTOSIS REPAIR;  Surgeon: Karle Starch, MD;  Location: [place];  Service: Ophthalmology;  Laterality: Bilateral;  Diabetic - oral meds   CATARACT EXTRACTION W/PHACO Left 03/22/2017   Procedure: CATARACT EXTRACTION PHACO AND INTRAOCULAR LENS PLACEMENT ([place]) left  diabetic;  Surgeon: Eulogio Bear, MD;  Location: [place];  Service: Ophthalmology;  Laterality: Left;  Diabetic   CATARACT EXTRACTION W/PHACO Right 05/17/2017   Procedure: CATARACT EXTRACTION PHACO AND INTRAOCULAR LENS PLACEMENT ([place]) Right diabetic;  Surgeon: Eulogio Bear, MD;  Location: [place];  Service: Ophthalmology;  Laterality: Right;   CHOLECYSTECTOMY     COLONOSCOPY WITH PROPOFOL N/A 11/14/2017   Procedure: COLONOSCOPY WITH PROPOFOL;  Surgeon: Lollie Sails, MD;  Location: [facility] ENDOSCOPY;  Service: Endoscopy;  Laterality: N/A;   EYE SURGERY     cataracts with extraocular prosthesis   KIDNEY SURGERY     gave daughter a kidney   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   Subjective Assessment - 03/04/22 0813     Subjective Pt. reports no L LE pain since last tx. session.  Pt. states she is doing well and remains active with household tasks/ assisting brother.  Pt. states she was able to walk around [employer] the other day with no c/o L LE pain.    Pertinent History Pt. currently retired. Pt. lives alone and has 6 steps with railing entering the house. Pt. has a good demeanor but does mention  she is currently taking care of her brother who is experiencing falls. Pt. loves to sew and walk. Pt. says she has not fallen but did have a close call where she had to catch herself recently. Pt. has a SPC but rarely uses it and states she is aware it would be helpful to use it more consistantly.    Limitations Walking;House hold activities    Patient Stated Goals Pt. wants to get rid of L LE pain and improve safety/balance with walking.  Patient Stated Goals  Pt. wants to get rid of L LE pain and improve safety/balance with walking.    Currently in Pain? No/denies              Ther.ex.:   Nustep L4 10 min. B UE/LE   Seated/standing hip ex. (3#):  LAQ/ marching/ walking in //-bars forward and  backwards/ lateral walking with light to no UE assist.  SBA/CGA for verbal cuing/ safety.        Neuro. Mm.:  BOSU step ups/ downs in //-bars.  Light UE assist on //-bars required.  10x with CGA for safety.      Walking in hallway with varying cadence and SBA/CGA 4 laps in hallway.  Tandem gait forward/backwards (SBA/CGA for safety)- mirror feedback.  Tandem stance >10 sec. With no UE assist.    Reassessment of goals/ Berg balance test (46/56)     Amb. In hallway with no assistive device and consistent cadence working on arm swing/ step pattern.   Walking outside to car with no assistive device/ curb/ sidewalk.  Cuing for posture correction.           PT Long Term Goals - 03/04/22 1227       PT LONG TERM GOAL #1   Title Pt. will improve FOTO score to 62 to improve pain free functional mobility.    Baseline 01/21/2022: 51.  2/28: 70    Time 8    Period Weeks    Status Achieved    Target Date 02/23/22      PT LONG TERM GOAL #2   Title Pt. will improve Berg balance score to >46/56 to decrease fall risk/ improve mod. independence with gait.    Baseline 1/26: 40/56.  2/28: 44/56.  3/9: 46/56    Time 8    Period Weeks    Status Achieved    Target Date 03/04/22      PT LONG TERM GOAL #3   Title Pt. will be able to ambulate on uneven terrain without LOB to perform community ADL's.    Baseline 1/26: Pt. experiences LOB when walking on uneven terrain (grass) and needed mod A from PT. to regain balance.    Time 8    Period Weeks    Status Achieved    Target Date 03/04/22      PT LONG TERM GOAL #4   Title Pt. will be able to balance in tandem stance for >30 seconds without CGA to increase balance and improve independence with ADLs.    Baseline 1/26: Pt. unable to balance for >10 seconds without LOB    Time 8    Period Weeks    Status Partially Met    Target Date 03/04/22                   Plan - 03/04/22 0814     Clinical Impression Statement Pt. has progressed  well towards all PT goals.  Pt. is ready for discharge from  PT services and will focus on HEP.  Pt. will contact PT if any questions or regression in symptoms.  Pt. reports no pain in L LE during tx. session and showed marked improvement in tandem stance/ Berg balance test.  Discharge from PT at this time.    Personal Factors and Comorbidities Time since onset of injury/illness/exacerbation    Examination-Activity Limitations Locomotion Level;Bend;Stairs    Stability/Clinical Decision Making Evolving/Moderate complexity    Clinical Decision Making Moderate    Rehab Potential Good    PT Frequency 2x / week    PT Duration 8 weeks    PT Treatment/Interventions ADLs/Self Care Home Management;Cryotherapy;Gait training;Stair training;Functional mobility training;Therapeutic activities;Therapeutic exercise;Neuromuscular re-education;Balance training;Patient/family education;Manual techniques;Passive range of motion    PT Next Visit Plan Discharge.    PT Home Exercise Plan [id number]    Consulted and Agree with Plan of Care Patient             Patient will benefit from skilled therapeutic intervention in order to improve the following deficits and impairments:  Abnormal gait, Decreased balance, Decreased activity tolerance, Decreased endurance, Decreased mobility, Decreased strength, Impaired flexibility, Difficulty walking, Decreased coordination, Hypomobility, Decreased range of motion, Decreased safety awareness  Visit Diagnosis: Imbalance  Gait difficulty  Muscle weakness of lower extremity  Pain in left leg     Problem List Patient Active Problem List   Diagnosis Date Noted   Gouty arthritis of both feet 01/15/2022   Mixed hyperlipidemia 01/15/2022   Gait abnormality 01/15/2022   Chronic left-sided low back pain with left-sided sciatica 04/21/2020   Foraminal stenosis of lumbar region 04/21/2020   Lumbar degenerative disc disease 03/20/2020   Osteoporosis, postmenopausal  05/16/2019   Pessary maintenance 01/11/2018   Vaginal polyp 12/12/2017   Vaginal atrophy 12/12/2017   Cystocele, midline 12/12/2017   Chronic diarrhea 09/15/2017   Chronic renal impairment, 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   Alzheimer's dementia without behavioral disturbance ([place]) 09/30/2016   Mixed incontinence 03/23/2016   B12 deficiency 02/03/2016   High risk medication use 01/15/2016   Primary insomnia 01/15/2016   Vitamin D deficiency, unspecified 01/15/2016   Microalbuminuria 09/23/2015   GERD without esophagitis 05/02/2015   History of cervical cancer 05/02/2015   History of hypothyroidism 05/02/2015   Urge incontinence 05/02/2015   Prediabetes 09/20/2014   Constipation by delayed colonic transit 08/30/2013   Rectocele 08/30/2013   Essential hypertension 07/02/2013   Pura Spice, PT, DPT # [phone] 03/04/2022, 12:29 PM  [place] [facility] [address] [place], [place], [address] Phone: [phone]   Fax:  [phone]  Name: Candice Cook MRN: [id number] Date of Birth: 1941/11/19

## 2022-04-22 ENCOUNTER — Other Ambulatory Visit: Payer: Self-pay | Admitting: Internal Medicine

## 2022-04-22 DIAGNOSIS — M109 Gout, unspecified: Secondary | ICD-10-CM

## 2022-04-22 DIAGNOSIS — K219 Gastro-esophageal reflux disease without esophagitis: Secondary | ICD-10-CM

## 2022-04-23 NOTE — Telephone Encounter (Signed)
Requesting meds  too soon ?Requested Prescriptions  ?Refused Prescriptions Disp Refills  ?? allopurinol (ZYLOPRIM) 100 MG tablet [Pharmacy Med Name: ALLOPURINOL 100 MG TABLET] 180 tablet 1  ?  Sig: TAKE 2 TABLETS BY MOUTH EVERY DAY  ?  ? Endocrinology:  Gout Agents - allopurinol Passed - 04/22/2022  5:20 PM  ?  ?  Passed - Uric Acid in normal range and within 360 days  ?  Uric Acid  ?Date Value Ref Range Status  ?01/15/2022 4.6 3.1 - 7.9 mg/dL Final  ?  Comment:  ?             Therapeutic target for gout patients: <6.0  ?   ?  ?  Passed - Cr in normal range and within 360 days  ?  Creatinine  ?Date Value Ref Range Status  ?02/11/2014 0.98 0.60 - 1.30 mg/dL Final  ? ?Creatinine, Ser  ?Date Value Ref Range Status  ?01/15/2022 0.93 0.57 - 1.00 mg/dL Final  ?   ?  ?  Passed - Valid encounter within last 12 months  ?  Recent Outpatient Visits   ?      ? 3 months ago Benign hypertension  ? [facility] Glean Hess, MD  ?  ?  ?Future Appointments   ?        ? In 3 weeks Glean Hess, MD [facility]  ?  ? ?  ?  ?  Passed - CBC within normal limits and completed in the last 12 months  ?  WBC  ?Date Value Ref Range Status  ?01/15/2022 13.0 (H) 3.4 - 10.8 x10E3/uL Final  ?12/03/2018 4.3 4.0 - 10.5 K/uL Final  ? ?RBC  ?Date Value Ref Range Status  ?01/15/2022 4.03 3.77 - 5.28 x10E6/uL Final  ?12/03/2018 4.28 3.87 - 5.11 MIL/uL Final  ? ?Hemoglobin  ?Date Value Ref Range Status  ?01/15/2022 12.2 11.1 - 15.9 g/dL Final  ? ?Hematocrit  ?Date Value Ref Range Status  ?01/15/2022 36.2 34.0 - 46.6 % Final  ? ?MCHC  ?Date Value Ref Range Status  ?01/15/2022 33.7 31.5 - 35.7 g/dL Final  ?12/03/2018 32.4 30.0 - 36.0 g/dL Final  ? ?MCH  ?Date Value Ref Range Status  ?01/15/2022 30.3 26.6 - 33.0 pg Final  ?12/03/2018 29.4 26.0 - 34.0 pg Final  ? ?MCV  ?Date Value Ref Range Status  ?01/15/2022 90 79 - 97 fL Final  ?02/11/2014 88 80 - 100 fL Final  ? ?No results found for: PLTCOUNTKUC, LABPLAT, [place] ?RDW   ?Date Value Ref Range Status  ?01/15/2022 13.5 11.7 - 15.4 % Final  ?02/11/2014 14.7 (H) 11.5 - 14.5 % Final  ? ?  ?  ?  ?? omeprazole (PRILOSEC) 40 MG capsule [Pharmacy Med Name: OMEPRAZOLE DR 40 MG CAPSULE] 90 capsule 1  ?  Sig: TAKE 1 CAPSULE (40 MG TOTAL) BY MOUTH DAILY.  ?  ? Gastroenterology: Proton Pump Inhibitors Passed - 04/22/2022  5:20 PM  ?  ?  Passed - Valid encounter within last 12 months  ?  Recent Outpatient Visits   ?      ? 3 months ago Benign hypertension  ? [facility] Glean Hess, MD  ?  ?  ?Future Appointments   ?        ? In 3 weeks Glean Hess, MD [facility], [place]  ?  ? ?  ?  ?  ? ? ?

## 2022-05-05 ENCOUNTER — Other Ambulatory Visit: Payer: Self-pay | Admitting: Internal Medicine

## 2022-05-05 DIAGNOSIS — K219 Gastro-esophageal reflux disease without esophagitis: Secondary | ICD-10-CM

## 2022-05-05 DIAGNOSIS — F5101 Primary insomnia: Secondary | ICD-10-CM

## 2022-05-05 DIAGNOSIS — M109 Gout, unspecified: Secondary | ICD-10-CM

## 2022-05-06 NOTE — Telephone Encounter (Signed)
Requested Prescriptions  ?Pending Prescriptions Disp Refills  ?? traZODone (DESYREL) 50 MG tablet [Pharmacy Med Name: TRAZODONE 50 MG TABLET] 180 tablet 0  ?  Sig: TAKE 2 TABLETS BY MOUTH AT BEDTIME.  ?  ? Psychiatry: Antidepressants - Serotonin Modulator Passed - 05/05/2022  3:48 PM  ?  ?  Passed - Valid encounter within last 6 months  ?  Recent Outpatient Visits   ?      ? 3 months ago Benign hypertension  ? [facility] Glean Hess, MD  ?  ?  ?Future Appointments   ?        ? In 1 week Glean Hess, MD [facility], [facility]  ?  ? ?  ?  ?  ?? allopurinol (ZYLOPRIM) 100 MG tablet [Pharmacy Med Name: ALLOPURINOL 100 MG TABLET] 180 tablet 1  ?  Sig: TAKE 2 TABLETS BY MOUTH EVERY DAY  ?  ? Endocrinology:  Gout Agents - allopurinol Passed - 05/05/2022  3:48 PM  ?  ?  Passed - Uric Acid in normal range and within 360 days  ?  Uric Acid  ?Date Value Ref Range Status  ?01/15/2022 4.6 3.1 - 7.9 mg/dL Final  ?  Comment:  ?             Therapeutic target for gout patients: <6.0  ?   ?  ?  Passed - Cr in normal range and within 360 days  ?  Creatinine  ?Date Value Ref Range Status  ?02/11/2014 0.98 0.60 - 1.30 mg/dL Final  ? ?Creatinine, Ser  ?Date Value Ref Range Status  ?01/15/2022 0.93 0.57 - 1.00 mg/dL Final  ?   ?  ?  Passed - Valid encounter within last 12 months  ?  Recent Outpatient Visits   ?      ? 3 months ago Benign hypertension  ? [facility] Glean Hess, MD  ?  ?  ?Future Appointments   ?        ? In 1 week Glean Hess, MD [facility], [facility]  ?  ? ?  ?  ?  Passed - CBC within normal limits and completed in the last 12 months  ?  WBC  ?Date Value Ref Range Status  ?01/15/2022 13.0 (H) 3.4 - 10.8 x10E3/uL Final  ?12/03/2018 4.3 4.0 - 10.5 K/uL Final  ? ?RBC  ?Date Value Ref Range Status  ?01/15/2022 4.03 3.77 - 5.28 x10E6/uL Final  ?12/03/2018 4.28 3.87 - 5.11 MIL/uL Final  ? ?Hemoglobin  ?Date Value Ref Range Status  ?01/15/2022 12.2 11.1 - 15.9 g/dL Final   ? ?Hematocrit  ?Date Value Ref Range Status  ?01/15/2022 36.2 34.0 - 46.6 % Final  ? ?MCHC  ?Date Value Ref Range Status  ?01/15/2022 33.7 31.5 - 35.7 g/dL Final  ?12/03/2018 32.4 30.0 - 36.0 g/dL Final  ? ?MCH  ?Date Value Ref Range Status  ?01/15/2022 30.3 26.6 - 33.0 pg Final  ?12/03/2018 29.4 26.0 - 34.0 pg Final  ? ?MCV  ?Date Value Ref Range Status  ?01/15/2022 90 79 - 97 fL Final  ?02/11/2014 88 80 - 100 fL Final  ? ?No results found for: PLTCOUNTKUC, LABPLAT, [place] ?RDW  ?Date Value Ref Range Status  ?01/15/2022 13.5 11.7 - 15.4 % Final  ?02/11/2014 14.7 (H) 11.5 - 14.5 % Final  ? ?  ?  ?  ?? omeprazole (PRILOSEC) 40 MG capsule [Pharmacy Med Name: OMEPRAZOLE DR 40 MG  CAPSULE] 90 capsule 1  ?  Sig: TAKE 1 CAPSULE (40 MG TOTAL) BY MOUTH DAILY.  ?  ? Gastroenterology: Proton Pump Inhibitors Passed - 05/05/2022  3:48 PM  ?  ?  Passed - Valid encounter within last 12 months  ?  Recent Outpatient Visits   ?      ? 3 months ago Benign hypertension  ? [facility] Glean Hess, MD  ?  ?  ?Future Appointments   ?        ? In 1 week Glean Hess, MD [facility], [place]  ?  ? ?  ?  ?  ? ? ?

## 2022-05-06 NOTE — Telephone Encounter (Signed)
Requested medication (s) are due for refill today: yes both meds ? ?Requested medication (s) are on the active medication list: yes   ? ?Last refill: Allopurinol      01/15/22 #180  1    Omeprazole 01/15/22  #90  1 refill ? ?Future visit scheduled yes ? ?Notes to clinic: Called pharmacy, need new prescriptions for meds. ? ?Requested Prescriptions  ?Pending Prescriptions Disp Refills  ? allopurinol (ZYLOPRIM) 100 MG tablet [Pharmacy Med Name: ALLOPURINOL 100 MG TABLET] 180 tablet 1  ?  Sig: TAKE 2 TABLETS BY MOUTH EVERY DAY  ?  ? Endocrinology:  Gout Agents - allopurinol Passed - 05/05/2022  3:48 PM  ?  ?  Passed - Uric Acid in normal range and within 360 days  ?  Uric Acid  ?Date Value Ref Range Status  ?01/15/2022 4.6 3.1 - 7.9 mg/dL Final  ?  Comment:  ?             Therapeutic target for gout patients: <6.0  ?   ?  ?  Passed - Cr in normal range and within 360 days  ?  Creatinine  ?Date Value Ref Range Status  ?02/11/2014 0.98 0.60 - 1.30 mg/dL Final  ? ?Creatinine, Ser  ?Date Value Ref Range Status  ?01/15/2022 0.93 0.57 - 1.00 mg/dL Final  ?   ?  ?  Passed - Valid encounter within last 12 months  ?  Recent Outpatient Visits   ? ?      ? 3 months ago Benign hypertension  ? [facility] Glean Hess, MD  ? ?  ?  ?Future Appointments   ? ?        ? In 1 week Glean Hess, MD [facility], [place]  ? ?  ? ? ?  ?  ?  Passed - CBC within normal limits and completed in the last 12 months  ?  WBC  ?Date Value Ref Range Status  ?01/15/2022 13.0 (H) 3.4 - 10.8 x10E3/uL Final  ?12/03/2018 4.3 4.0 - 10.5 K/uL Final  ? ?RBC  ?Date Value Ref Range Status  ?01/15/2022 4.03 3.77 - 5.28 x10E6/uL Final  ?12/03/2018 4.28 3.87 - 5.11 MIL/uL Final  ? ?Hemoglobin  ?Date Value Ref Range Status  ?01/15/2022 12.2 11.1 - 15.9 g/dL Final  ? ?Hematocrit  ?Date Value Ref Range Status  ?01/15/2022 36.2 34.0 - 46.6 % Final  ? ?MCHC  ?Date Value Ref Range Status  ?01/15/2022 33.7 31.5 - 35.7 g/dL Final  ?12/03/2018 32.4  30.0 - 36.0 g/dL Final  ? ?MCH  ?Date Value Ref Range Status  ?01/15/2022 30.3 26.6 - 33.0 pg Final  ?12/03/2018 29.4 26.0 - 34.0 pg Final  ? ?MCV  ?Date Value Ref Range Status  ?01/15/2022 90 79 - 97 fL Final  ?02/11/2014 88 80 - 100 fL Final  ? ?No results found for: PLTCOUNTKUC, LABPLAT, [place] ?RDW  ?Date Value Ref Range Status  ?01/15/2022 13.5 11.7 - 15.4 % Final  ?02/11/2014 14.7 (H) 11.5 - 14.5 % Final  ? ?  ?  ?  ? omeprazole (PRILOSEC) 40 MG capsule [Pharmacy Med Name: OMEPRAZOLE DR 40 MG CAPSULE] 90 capsule 1  ?  Sig: TAKE 1 CAPSULE (40 MG TOTAL) BY MOUTH DAILY.  ?  ? Gastroenterology: Proton Pump Inhibitors Passed - 05/05/2022  3:48 PM  ?  ?  Passed - Valid encounter within last 12 months  ?  Recent Outpatient Visits   ? ?      ?  3 months ago Benign hypertension  ? [facility] Glean Hess, MD  ? ?  ?  ?Future Appointments   ? ?        ? In 1 week Glean Hess, MD [facility], [place]  ? ?  ? ? ?  ?  ?  ?Signed Prescriptions Disp Refills  ? traZODone (DESYREL) 50 MG tablet 180 tablet 0  ?  Sig: TAKE 2 TABLETS BY MOUTH AT BEDTIME.  ?  ? Psychiatry: Antidepressants - Serotonin Modulator Passed - 05/05/2022  3:48 PM  ?  ?  Passed - Valid encounter within last 6 months  ?  Recent Outpatient Visits   ? ?      ? 3 months ago Benign hypertension  ? [facility] Glean Hess, MD  ? ?  ?  ?Future Appointments   ? ?        ? In 1 week Glean Hess, MD [facility], [place]  ? ?  ? ? ?  ?  ?  ? ? ? ? ?

## 2022-05-17 ENCOUNTER — Encounter: Payer: Self-pay | Admitting: Internal Medicine

## 2022-05-17 ENCOUNTER — Ambulatory Visit (INDEPENDENT_AMBULATORY_CARE_PROVIDER_SITE_OTHER): Payer: Medicare HMO | Admitting: Internal Medicine

## 2022-05-17 VITALS — BP 122/64 | HR 48 | Ht 60.0 in | Wt 106.0 lb

## 2022-05-17 DIAGNOSIS — M109 Gout, unspecified: Secondary | ICD-10-CM | POA: Diagnosis not present

## 2022-05-17 DIAGNOSIS — N182 Chronic kidney disease, stage 2 (mild): Secondary | ICD-10-CM | POA: Diagnosis not present

## 2022-05-17 DIAGNOSIS — R7303 Prediabetes: Secondary | ICD-10-CM | POA: Diagnosis not present

## 2022-05-17 DIAGNOSIS — M810 Age-related osteoporosis without current pathological fracture: Secondary | ICD-10-CM | POA: Diagnosis not present

## 2022-05-17 DIAGNOSIS — R1031 Right lower quadrant pain: Secondary | ICD-10-CM | POA: Diagnosis not present

## 2022-05-17 DIAGNOSIS — I10 Essential (primary) hypertension: Secondary | ICD-10-CM

## 2022-05-17 DIAGNOSIS — K219 Gastro-esophageal reflux disease without esophagitis: Secondary | ICD-10-CM | POA: Diagnosis not present

## 2022-05-17 DIAGNOSIS — Z1321 Encounter for screening for nutritional disorder: Secondary | ICD-10-CM | POA: Diagnosis not present

## 2022-05-17 NOTE — Progress Notes (Signed)
Date:  05/17/2022   Name:  Candice Cook   DOB:  18-Apr-1941   MRN:  [id number]   Chief Complaint: Hypertension and Depression  Hypertension This is a chronic problem. The problem is controlled. Pertinent negatives include no chest pain, headaches, palpitations or shortness of breath. Past treatments include ACE inhibitors and diuretics. The current treatment provides significant improvement.  Depression        This is a chronic (with Alzheimers - put on Zoloft by Dr. Melrose Nakayama) problem.  Associated symptoms include no fatigue and no headaches.  Past treatments include SSRIs - Selective serotonin reuptake inhibitors and other medications.  Compliance with treatment is good. Abdominal Pain This is a recurrent problem. The problem has been unchanged (lasts about 30 minutes after eating certain foods). The quality of the pain is cramping. Pertinent negatives include no constipation, diarrhea or headaches.  Diabetes She presents for her follow-up diabetic visit. Diabetes type: prediabetes. Her disease course has been stable. Pertinent negatives for hypoglycemia include no dizziness or headaches. Pertinent negatives for diabetes include no chest pain, no fatigue and no weakness. Current diabetic treatment includes diet. She is compliant with treatment all of the time.  Gout - no recent flares.  On Allopurinol 200 mg daily.  Lab Results  Component Value Date   NA 139 01/15/2022   K 3.7 01/15/2022   CO2 27 01/15/2022   GLUCOSE 153 (H) 01/15/2022   BUN 11 01/15/2022   CREATININE 0.93 01/15/2022   CALCIUM 9.0 01/15/2022   EGFR 63 01/15/2022   GFRNONAA >60 12/03/2018   Lab Results  Component Value Date   CHOL 140 01/15/2022   HDL 47 01/15/2022   LDLCALC 58 01/15/2022   TRIG 217 (H) 01/15/2022   CHOLHDL 3.0 01/15/2022   No results found for: TSH Lab Results  Component Value Date   HGBA1C 6.2 (H) 01/15/2022   Lab Results  Component Value Date   WBC 13.0 (H) 01/15/2022   HGB 12.2  01/15/2022   HCT 36.2 01/15/2022   MCV 90 01/15/2022   PLT 140 (L) 01/15/2022   Lab Results  Component Value Date   ALT 12 01/15/2022   AST 18 01/15/2022   ALKPHOS 56 01/15/2022   BILITOT 0.4 01/15/2022   No results found for: 25OHVITD2, 25OHVITD3, VD25OH   Review of Systems  Constitutional:  Negative for fatigue and unexpected weight change.  HENT:  Negative for nosebleeds.   Eyes:  Negative for visual disturbance.  Respiratory:  Negative for cough, chest tightness, shortness of breath and wheezing.   Cardiovascular:  Negative for chest pain, palpitations and leg swelling.  Gastrointestinal:  Positive for abdominal pain. Negative for constipation and diarrhea.  Neurological:  Negative for dizziness, weakness, light-headedness and headaches.  Psychiatric/Behavioral:  Positive for depression.    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   Pessary maintenance 01/11/2018   Vaginal polyp 12/12/2017   Vaginal atrophy 12/12/2017   Cystocele, midline 12/12/2017   Chronic diarrhea 09/15/2017   Chronic renal impairment, 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   Alzheimer's dementia without behavioral disturbance ([place]) 09/30/2016   Mixed incontinence 03/23/2016   B12 deficiency 02/03/2016   High risk medication use 01/15/2016  Primary insomnia 01/15/2016   Vitamin D deficiency, unspecified 01/15/2016   Microalbuminuria 09/23/2015   GERD without esophagitis 05/02/2015   History of cervical cancer 05/02/2015   History of hypothyroidism 05/02/2015   Urge incontinence 05/02/2015   Prediabetes 09/20/2014   Constipation by delayed colonic transit 08/30/2013   Rectocele  08/30/2013   Essential hypertension 07/02/2013    Allergies  Allergen Reactions   Mirabegron Other (See Comments)    Severe tremor, headache   Metformin And Related Diarrhea    Past Surgical History:  Procedure Laterality Date   ABDOMINAL HYSTERECTOMY     APPENDECTOMY  1993   BROW LIFT Bilateral 02/27/2019   Procedure: BLEPHAROPLASTY UPPER EYELID W/EXCESS SKIN;  Surgeon: Karle Starch, MD;  Location: [place];  Service: Ophthalmology;  Laterality: Bilateral;   BROW PTOSIS Bilateral 02/27/2019   Procedure: BROW PTOSIS REPAIR;  Surgeon: Karle Starch, MD;  Location: [place];  Service: Ophthalmology;  Laterality: Bilateral;  Diabetic - oral meds   CATARACT EXTRACTION W/PHACO Left 03/22/2017   Procedure: CATARACT EXTRACTION PHACO AND INTRAOCULAR LENS PLACEMENT ([place]) left diabetic;  Surgeon: Eulogio Bear, MD;  Location: [place];  Service: Ophthalmology;  Laterality: Left;  Diabetic   CATARACT EXTRACTION W/PHACO Right 05/17/2017   Procedure: CATARACT EXTRACTION PHACO AND INTRAOCULAR LENS PLACEMENT ([place]) Right diabetic;  Surgeon: Eulogio Bear, MD;  Location: [place];  Service: Ophthalmology;  Laterality: Right;   CHOLECYSTECTOMY     COLONOSCOPY WITH PROPOFOL N/A 11/14/2017   Procedure: COLONOSCOPY WITH PROPOFOL;  Surgeon: Lollie Sails, MD;  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  Current Meds  Medication Sig   allopurinol (ZYLOPRIM) 100 MG  tablet Take 2 tablets (200 mg total) by mouth daily.   aspirin EC 81 MG tablet Take 81 mg by mouth daily.   gabapentin (NEURONTIN) 300 MG capsule Take by mouth. [facility]   lisinopril-hydrochlorothiazide (ZESTORETIC) 20-12.5 MG tablet Take 1 tablet by mouth daily.   Multiple Vitamin (MULTI-VITAMIN PO) Take by mouth.   omeprazole (PRILOSEC) 40 MG capsule Take 1 capsule (40 mg total) by mouth daily.   sertraline (ZOLOFT) 50 MG tablet Take by mouth.   simvastatin (ZOCOR) 20 MG tablet Take 1 tablet (20 mg total) by mouth daily.   traZODone (DESYREL) 50 MG tablet TAKE 2 TABLETS BY MOUTH AT BEDTIME.       05/17/2022    8:16 AM 01/15/2022    2:53 PM  GAD 7 : Generalized Anxiety Score  Nervous, Anxious, on Edge 2 2  Control/stop worrying 2 2  Worry too much - different things 2 2  Trouble relaxing 2 2  Restless 2 2  Easily annoyed or irritable 1 1  Afraid - awful might happen 0 1  Total GAD 7 Score 11 12       05/17/2022  8:15 AM  Depression screen PHQ 2/9  Decreased Interest 1  Down, Depressed, Hopeless 1  PHQ - 2 Score 2  Altered sleeping 1  Tired, decreased energy 1  Change in appetite 3  Feeling bad or failure about yourself  1  Trouble concentrating 1  Moving slowly or fidgety/restless 0  Suicidal thoughts 0  PHQ-9 Score 9    BP Readings from Last 3 Encounters:  05/17/22 122/64  02/11/22 (!) 159/56  01/15/22 (!) 122/52    Physical Exam Vitals and nursing note reviewed.  Constitutional:      General: She is not in acute distress.    Appearance: Normal appearance. She is well-developed.  HENT:     Head: Normocephalic and atraumatic.  Cardiovascular:     Rate and Rhythm: Normal rate and regular rhythm.     Pulses: Normal pulses.     Heart sounds: No murmur heard. Pulmonary:     Effort: Pulmonary effort is normal. No respiratory distress.     Breath sounds: No wheezing or rhonchi.  Abdominal:     General: Abdomen is flat.     Palpations:  Abdomen is soft. There is no mass.     Tenderness: There is no abdominal tenderness. There is no guarding or rebound.  Musculoskeletal:     Cervical back: Normal range of motion.     Right lower leg: No edema.     Left lower leg: No edema.  Lymphadenopathy:     Cervical: No cervical adenopathy.  Skin:    General: Skin is warm and dry.     Capillary Refill: Capillary refill takes less than 2 seconds.     Findings: No rash.  Neurological:     General: No focal deficit present.     Mental Status: She is alert and oriented to person, place, and time.  Psychiatric:        Mood and Affect: Mood normal.        Behavior: Behavior normal.    Wt Readings from Last 3 Encounters:  05/17/22 106 lb (48.1 kg)  01/15/22 107 lb (48.5 kg)  09/16/20 128 lb 15.5 oz (58.5 kg)    BP 122/64   Pulse (!) 48   Ht 5' (1.524 m)   Wt 106 lb (48.1 kg)   SpO2 97%   BMI 20.70 kg/m   Assessment and Plan: 1. Essential hypertension Clinically stable exam with well controlled BP. Tolerating medications without side effects at this time. Pt to continue current regimen and low sodium diet; benefits of regular exercise as able discussed. - Comprehensive metabolic panel  2. Right lower quadrant abdominal pain She notices this with certain foods but she is able to eat fruit, salads and chicken. She is s/p cholecystectomy, appendectomy and TAH - Comprehensive metabolic panel  3. GERD without esophagitis Symptoms well controlled on daily PPI No red flag signs such as weight loss, n/v, melena Will continue omeprazole  4. Prediabetes Continue healthy diet - Hemoglobin A1c  5. Chronic renal impairment, stage 2 (mild) Check labs - VITAMIN D 25 Hydroxy (Vit-D Deficiency, Fractures)  6. Gouty arthritis of both feet On allopurinol for prevention - will continue to monitor UA and Cr - Uric acid   Partially dictated using Editor, commissioning. Any errors are unintentional.  Halina Maidens, MD [facility]  05/17/2022

## 2022-05-18 LAB — COMPREHENSIVE METABOLIC PANEL
ALT: 13 IU/L (ref 0–32)
AST: 21 IU/L (ref 0–40)
Albumin/Globulin Ratio: 2.3 — ABNORMAL HIGH (ref 1.2–2.2)
Albumin: 3.7 g/dL (ref 3.7–4.7)
Alkaline Phosphatase: 49 IU/L (ref 44–121)
BUN/Creatinine Ratio: 13 (ref 12–28)
BUN: 10 mg/dL (ref 8–27)
Bilirubin Total: 0.4 mg/dL (ref 0.0–1.2)
CO2: 27 mmol/L (ref 20–29)
Calcium: 8.9 mg/dL (ref 8.7–10.3)
Chloride: 108 mmol/L — ABNORMAL HIGH (ref 96–106)
Creatinine, Ser: 0.76 mg/dL (ref 0.57–1.00)
Globulin, Total: 1.6 g/dL (ref 1.5–4.5)
Glucose: 106 mg/dL — ABNORMAL HIGH (ref 70–99)
Potassium: 4.3 mmol/L (ref 3.5–5.2)
Sodium: 148 mmol/L — ABNORMAL HIGH (ref 134–144)
Total Protein: 5.3 g/dL — ABNORMAL LOW (ref 6.0–8.5)
eGFR: 80 mL/min/{1.73_m2} (ref >59)

## 2022-05-18 LAB — HEMOGLOBIN A1C
Est. average glucose Bld gHb Est-mCnc: 120 mg/dL
Hgb A1c MFr Bld: 5.8 % — ABNORMAL HIGH (ref 4.8–5.6)

## 2022-05-18 LAB — URIC ACID: Uric Acid: 4 mg/dL (ref 3.1–7.9)

## 2022-05-18 LAB — VITAMIN D 25 HYDROXY (VIT D DEFICIENCY, FRACTURES): Vit D, 25-Hydroxy: 44.7 ng/mL (ref 30.0–100.0)

## 2022-06-11 ENCOUNTER — Ambulatory Visit
Admission: EM | Admit: 2022-06-11 | Discharge: 2022-06-11 | Disposition: A | Discharge status: Home / Self Care | Payer: Medicare HMO | Attending: Emergency Medicine | Admitting: Emergency Medicine

## 2022-06-11 ENCOUNTER — Ambulatory Visit (INDEPENDENT_AMBULATORY_CARE_PROVIDER_SITE_OTHER): Payer: Medicare HMO

## 2022-06-11 DIAGNOSIS — S60221A Contusion of right hand, initial encounter: Secondary | ICD-10-CM

## 2022-06-11 DIAGNOSIS — M79641 Pain in right hand: Secondary | ICD-10-CM

## 2022-06-11 NOTE — ED Provider Notes (Signed)
HPI  SUBJECTIVE:  Candice Cook is a right-handed 81 y.o. female who presents with burning, intermittent, seconds long right lateral hand pain after having a stumble and fall 2 weeks ago.  She is not completely sure how she fell onto her hand.  She denies wrist or forearm injury.  She denies syncope, preceding chest pain, palpitations causing the fall.  She denies head trauma.  She reports bruising, but states that this is improving.  No new deformities.  She reports grip weakness secondary to pain.  No distal numbness or tingling, limitation of motion of her fingers.  She has not tried anything for this.  No alleviating factors.  Symptoms are worse with closing her hand into a fist, and with other daily activities.  She has a past medical history of chronic kidney disease stage II, gout, diabetes, hypertension, osteoporosis, short-term memory loss.  PCP: [facility].    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   Wears dentures    full upper and lower    Past Surgical History:  Procedure Laterality Date   ABDOMINAL HYSTERECTOMY     APPENDECTOMY  1993   BROW LIFT Bilateral 02/27/2019   Procedure: BLEPHAROPLASTY UPPER EYELID W/EXCESS SKIN;  Surgeon: Karle Starch, MD;  Location: [place];  Service: Ophthalmology;  Laterality: Bilateral;   BROW PTOSIS Bilateral 02/27/2019   Procedure: BROW PTOSIS REPAIR;  Surgeon: Karle Starch, MD;  Location: [place];  Service: Ophthalmology;  Laterality: Bilateral;  Diabetic - oral meds   CATARACT EXTRACTION W/PHACO  Left 03/22/2017   Procedure: CATARACT EXTRACTION PHACO AND INTRAOCULAR LENS PLACEMENT ([place]) left diabetic;  Surgeon: Eulogio Bear, MD;  Location: [place];  Service: Ophthalmology;  Laterality: Left;  Diabetic   CATARACT EXTRACTION W/PHACO Right 05/17/2017   Procedure: CATARACT EXTRACTION PHACO AND INTRAOCULAR LENS PLACEMENT ([place]) Right diabetic;  Surgeon: Eulogio Bear, MD;  Location: [place];  Service: Ophthalmology;  Laterality: Right;   CHOLECYSTECTOMY     COLONOSCOPY WITH PROPOFOL N/A 11/14/2017   Procedure: COLONOSCOPY WITH PROPOFOL;  Surgeon: Lollie Sails, MD;  Location: [facility] ENDOSCOPY;  Service: Endoscopy;  Laterality: N/A;   EYE SURGERY     cataracts with extraocular prosthesis   KIDNEY SURGERY     gave daughter a kidney   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  Current Outpatient Medications:    allopurinol (ZYLOPRIM) 100 MG tablet, Take 2 tablets (200 mg total) by mouth daily., Disp: 180 tablet, Rfl: 1   aspirin EC 81 MG tablet, Take 81 mg by mouth daily., Disp: , Rfl:    gabapentin (NEURONTIN) 300 MG capsule, Take 900 mg by mouth at bedtime. [facility], Disp: , Rfl:    lisinopril-hydrochlorothiazide (ZESTORETIC) 20-12.5 MG tablet, Take 1 tablet by mouth daily., Disp: 90 tablet, Rfl: 1   Multiple Vitamin (MULTI-VITAMIN  PO), Take by mouth., Disp: , Rfl:    omeprazole (PRILOSEC) 40 MG capsule, Take 1 capsule (40 mg total) by mouth daily., Disp: 90 capsule, Rfl: 1   sertraline (ZOLOFT) 50 MG tablet, Take by mouth., Disp: , Rfl:    simvastatin (ZOCOR) 20 MG tablet, Take 1 tablet (20 mg total) by mouth daily., Disp: 90 tablet, Rfl: 1   traZODone (DESYREL) 50 MG tablet, TAKE 2 TABLETS BY MOUTH AT BEDTIME., Disp: 180 tablet, Rfl: 0  Allergies  Allergen Reactions   Mirabegron Other (See Comments)    Severe tremor, headache   Metformin And Related Diarrhea     ROS  As noted in HPI.   Physical Exam  BP (!) 131/54 (BP Location: Left Arm)   Pulse (!) 50   Temp 98.5 F (36.9 C) (Oral)   Resp 18   SpO2 98%   Constitutional: Well developed, well nourished, no acute distress Eyes:  EOMI, conjunctiva normal bilaterally HENT: Normocephalic, atraumatic,mucus membranes moist Respiratory: Normal inspiratory effort Cardiovascular: Normal rate GI: nondistended skin: No rash, skin intact Musculoskeletal: Right hand: Tenderness along the fourth and fifth metacarpals.  No bruising, soft tissue swelling.  Baseline Strength and Sensation with normal light touch intact for Pt, distal motor and sensation in median/radial/ulnar nerve distribution with CR< 2 secs and pulse intact.  Grip strength 5/5 and equal bilaterally.  Skin intact.  Forearm, wrist WNL.  No pain with wrist range of motion.  No scaphoid tenderness. Neurologic: Alert & oriented x 3, no focal neuro deficits Psychiatric: Speech and behavior appropriate   ED Course   Medications - No data to display  Orders Placed This Encounter  Procedures   DG Hand Complete Right    Standing Status:   Standing    Number of Occurrences:   1    Order Specific Question:   Reason for Exam (SYMPTOM  OR DIAGNOSIS REQUIRED)    Answer:   fall, hand pain    Order Specific Question:   Release to patient    Answer:   Immediate    No results found for this or any  previous visit (from the past 24 hour(s)). DG Hand Complete Right  Result Date: 06/11/2022 CLINICAL DATA:  Fall, inner and outer hand pain EXAM: RIGHT HAND - COMPLETE 3+ VIEW COMPARISON:  None Available. FINDINGS: No acute fracture or dislocation.Diffuse degenerative joint space narrowing.Chronic appearing deformity of the fifth DIP joint, with radial angulation of the distal phalanx. Alignment is otherwise unremarkable.No significant soft tissue abnormality or foreign body. IMPRESSION: No acute osseous abnormality. Electronically Signed   By: Merilyn Baba M.D.   On: 06/11/2022 12:11    ED Clinical Impression  1. Contusion of right hand, initial encounter      ED Assessment/Plan  Reviewed imaging independently.  No acute fracture or dislocation.  See radiology report for full details.  Patient with a hand contusion.  X-ray negative for fracture.  There is no evidence of injury to the wrist.  Advised ice, heat, whichever feels better, Tylenol 3-4 times a day as needed for pain.  Follow-up with PCP or may return here if not getting better in several weeks.  Discussed  imaging, MDM, treatment plan, and plan for follow-up with patient. patient agrees with plan.   No orders of the defined types were placed in this encounter.     *This clinic note was created using Dragon dictation software. Therefore, there may be occasional mistakes despite careful proofreading.  ?    Melynda Ripple, MD 06/11/22 1246

## 2022-06-11 NOTE — ED Triage Notes (Signed)
Pt presents s/p fall on deck approx 10 days ago.  Fell into table and believes she extended R arm.  Bruises have healed but continues to have pain in outer and inner hand.  Intermittent burning sensation at base  and mild bruising noted at base of 5th digit.  States pinky is curved at baseline.  Pain with use.

## 2022-06-11 NOTE — Discharge Instructions (Addendum)
There is no fracture on your x-ray today.  Apply ice or heat, whichever feels better.  Take 500 to 1000 mg of Tylenol 3-4 times a day as needed for pain.  This may take several more weeks to fully resolve.

## 2022-06-28 ENCOUNTER — Telehealth: Payer: Self-pay | Admitting: Internal Medicine

## 2022-06-28 NOTE — Telephone Encounter (Signed)
Copied from [place] [phone]. Topic: Medicare AWV >> Jun 28, 2022  9:29 AM Jae Dire wrote: Reason for CRM:  No answer unable to leave a  message for patient to call back and schedule Medicare Annual Wellness Visit (AWV) in office.   If unable to come into the office for AWV,  please offer to do virtually or by telephone.  No hx of AWV eligible for AWVI per [employer] as of 06/27/2007  Please schedule at anytime with [facility] Health Advisor.      45 minute appointment   Any questions, please call me at [phone]

## 2022-07-24 ENCOUNTER — Emergency Department: Payer: Medicare HMO

## 2022-07-24 ENCOUNTER — Emergency Department
Admission: EM | Admit: 2022-07-24 | Discharge: 2022-07-24 | Disposition: A | Discharge status: Home / Self Care | Payer: Medicare HMO | Attending: Emergency Medicine | Admitting: Emergency Medicine

## 2022-07-24 ENCOUNTER — Other Ambulatory Visit: Payer: Self-pay

## 2022-07-24 ENCOUNTER — Encounter: Payer: Self-pay | Admitting: Emergency Medicine

## 2022-07-24 DIAGNOSIS — R0789 Other chest pain: Secondary | ICD-10-CM | POA: Diagnosis not present

## 2022-07-24 DIAGNOSIS — R001 Bradycardia, unspecified: Secondary | ICD-10-CM | POA: Insufficient documentation

## 2022-07-24 DIAGNOSIS — K5730 Diverticulosis of large intestine without perforation or abscess without bleeding: Secondary | ICD-10-CM | POA: Diagnosis not present

## 2022-07-24 DIAGNOSIS — M545 Low back pain, unspecified: Secondary | ICD-10-CM | POA: Diagnosis not present

## 2022-07-24 DIAGNOSIS — I10 Essential (primary) hypertension: Secondary | ICD-10-CM | POA: Diagnosis not present

## 2022-07-24 DIAGNOSIS — R1011 Right upper quadrant pain: Secondary | ICD-10-CM | POA: Insufficient documentation

## 2022-07-24 DIAGNOSIS — R109 Unspecified abdominal pain: Secondary | ICD-10-CM

## 2022-07-24 DIAGNOSIS — R1013 Epigastric pain: Secondary | ICD-10-CM | POA: Diagnosis not present

## 2022-07-24 DIAGNOSIS — R1084 Generalized abdominal pain: Secondary | ICD-10-CM | POA: Diagnosis not present

## 2022-07-24 DIAGNOSIS — I959 Hypotension, unspecified: Secondary | ICD-10-CM | POA: Diagnosis not present

## 2022-07-24 DIAGNOSIS — M549 Dorsalgia, unspecified: Secondary | ICD-10-CM | POA: Diagnosis not present

## 2022-07-24 DIAGNOSIS — R1010 Upper abdominal pain, unspecified: Secondary | ICD-10-CM | POA: Diagnosis present

## 2022-07-24 LAB — URINALYSIS, ROUTINE W REFLEX MICROSCOPIC
Bilirubin Urine: NEGATIVE
Glucose, UA: NEGATIVE mg/dL
Hgb urine dipstick: NEGATIVE
Ketones, ur: NEGATIVE mg/dL
Leukocytes,Ua: NEGATIVE
Nitrite: NEGATIVE
Protein, ur: NEGATIVE mg/dL
Specific Gravity, Urine: 1.009 (ref 1.005–1.030)
pH: 5 (ref 5.0–8.0)

## 2022-07-24 LAB — COMPREHENSIVE METABOLIC PANEL
ALT: 15 U/L (ref 0–44)
AST: 22 U/L (ref 15–41)
Albumin: 3.8 g/dL (ref 3.5–5.0)
Alkaline Phosphatase: 38 U/L (ref 38–126)
Anion gap: 5 (ref 5–15)
BUN: 25 mg/dL — ABNORMAL HIGH (ref 8–23)
CO2: 29 mmol/L (ref 22–32)
Calcium: 9 mg/dL (ref 8.9–10.3)
Chloride: 105 mmol/L (ref 98–111)
Creatinine, Ser: 0.99 mg/dL (ref 0.44–1.00)
GFR, Estimated: 58 mL/min — ABNORMAL LOW (ref >60)
Glucose, Bld: 126 mg/dL — ABNORMAL HIGH (ref 70–99)
Potassium: 3.6 mmol/L (ref 3.5–5.1)
Sodium: 139 mmol/L (ref 135–145)
Total Bilirubin: 0.8 mg/dL (ref 0.3–1.2)
Total Protein: 6 g/dL — ABNORMAL LOW (ref 6.5–8.1)

## 2022-07-24 LAB — CBC WITH DIFFERENTIAL/PLATELET
Abs Immature Granulocytes: 0.02 10*3/uL (ref 0.00–0.07)
Basophils Absolute: 0.1 10*3/uL (ref 0.0–0.1)
Basophils Relative: 1 %
Eosinophils Absolute: 0.1 10*3/uL (ref 0.0–0.5)
Eosinophils Relative: 1 %
HCT: 39.5 % (ref 36.0–46.0)
Hemoglobin: 12.9 g/dL (ref 12.0–15.0)
Immature Granulocytes: 0 %
Lymphocytes Relative: 61 %
Lymphs Abs: 6.4 10*3/uL — ABNORMAL HIGH (ref 0.7–4.0)
MCH: 29.6 pg (ref 26.0–34.0)
MCHC: 32.7 g/dL (ref 30.0–36.0)
MCV: 90.6 fL (ref 80.0–100.0)
Monocytes Absolute: 0.4 10*3/uL (ref 0.1–1.0)
Monocytes Relative: 4 %
Neutro Abs: 3.4 10*3/uL (ref 1.7–7.7)
Neutrophils Relative %: 33 %
Platelets: 129 10*3/uL — ABNORMAL LOW (ref 150–400)
RBC: 4.36 MIL/uL (ref 3.87–5.11)
RDW: 13.9 % (ref 11.5–15.5)
Smear Review: NORMAL
WBC: 10.4 10*3/uL (ref 4.0–10.5)
nRBC: 0 % (ref 0.0–0.2)

## 2022-07-24 LAB — LIPASE, BLOOD: Lipase: 52 U/L — ABNORMAL HIGH (ref 11–51)

## 2022-07-24 MED ORDER — ALUM & MAG HYDROXIDE-SIMETH 200-200-20 MG/5ML PO SUSP
30.0000 mL | Freq: Once | ORAL | Status: AC
  Administered 2022-07-24: 30 mL via ORAL
  Filled 2022-07-24: qty 30

## 2022-07-24 MED ORDER — SUCRALFATE 1 G PO TABS: 1.0000 g | ORAL_TABLET | Freq: Four times a day (QID) | ORAL | 0 refills | Status: DC

## 2022-07-24 MED ORDER — LIDOCAINE VISCOUS HCL 2 % MT SOLN
15.0000 mL | Freq: Once | OROMUCOSAL | Status: AC
  Administered 2022-07-24: 15 mL via ORAL
  Filled 2022-07-24: qty 15

## 2022-07-24 MED ORDER — FAMOTIDINE 20 MG PO TABS: 20.0000 mg | ORAL_TABLET | Freq: Every day | ORAL | 1 refills | Status: DC

## 2022-07-24 NOTE — ED Notes (Signed)
Pt was given food and sts she feels all right afterwards.

## 2022-07-24 NOTE — ED Triage Notes (Signed)
Pt in via EMS from with c/o abd pain for 6 months, worsening today. Pt reports tenderness to RUQ and lower back pain with palpation. Pt also missing left kidney.

## 2022-07-24 NOTE — ED Triage Notes (Signed)
Pt reports abd pain when she eats anything. Pt states the pain is all over her stomach, crampy in nature and hurts all the way to her breasts to the point she has to remove her bra.

## 2022-07-24 NOTE — ED Provider Notes (Signed)
[facility] Provider Note    Event Date/Time   First MD Initiated Contact with Patient 07/24/22 1557     (approximate)   History   Abdominal Pain   HPI  Candice Cook is a 81 y.o. female  who presents to the emergency department today because of concern for abdominal pain.  The patient states that the pain has been present for months.  It is intermittent.  Will occur after the patient tries to eat something.  Located in the upper abdomen she feels like it does come into her lower chest as well.  We will then gradually ease off.  Today the patient tried eating a hot dog when the pain came back and was the most severe it has ever been.  She states she has not seen anybody about this pain at this time.  Did try taking the medication 1 time because she thought it might be due to constipation but that did not seem to help.  She has not had any vomiting. No recent fevers. Status post cholecystectomy.    Physical Exam   Triage Vital Signs: ED Triage Vitals  Enc Vitals Group     BP 07/24/22 1541 (!) 140/103     Pulse Rate 07/24/22 1541 62     Resp 07/24/22 1541 18     Temp 07/24/22 1541 98 F (36.7 C)     Temp Source 07/24/22 1541 Oral     SpO2 07/24/22 1541 100 %     Weight 07/24/22 1542 99 lb (44.9 kg)     Height 07/24/22 1542 5' (1.524 m)     Head Circumference --      Peak Flow --      Pain Score 07/24/22 1542 4     Pain Loc --      Pain Edu? --      Excl. in [place]? --     Most recent vital signs: Vitals:   07/24/22 1541  BP: (!) 140/103  Pulse: 62  Resp: 18  Temp: 98 F (36.7 C)  SpO2: 100%   General: Awake, alert, oriented. CV:  Good peripheral perfusion. Regular rate and rhythm. Resp:  Normal effort. Lungs clear. Abd:  No distention. Minimally tender in epigastric and RUQ.     ED Results / Procedures / Treatments   Labs (all labs ordered are listed, but only abnormal results are displayed) Labs Reviewed  CBC WITH  DIFFERENTIAL/PLATELET - Abnormal; Notable for the following components:      Result Value   Platelets 129 (*)    Lymphs Abs 6.4 (*)    All other components within normal limits  COMPREHENSIVE METABOLIC PANEL - Abnormal; Notable for the following components:   Glucose, Bld 126 (*)    BUN 25 (*)    Total Protein 6.0 (*)    GFR, Estimated 58 (*)    All other components within normal limits  LIPASE, BLOOD - Abnormal; Notable for the following components:   Lipase 52 (*)    All other components within normal limits  URINALYSIS, ROUTINE W REFLEX MICROSCOPIC - Abnormal; Notable for the following components:   Color, Urine YELLOW (*)    APPearance CLEAR (*)    All other components within normal limits     EKG  I, Nance Pear, attending physician, personally viewed and interpreted this EKG  EKG Time: 1549 Rate: 53 Rhythm: sinus bradycardia Axis: normal Intervals: qtc 430 QRS: narrow ST changes: no st elevation Impression: abnormal  ekg   RADIOLOGY I independently interpreted and visualized the CT abd/pel. My interpretation: No free air Radiology interpretation:  IMPRESSION:  There is no evidence of intestinal obstruction or pneumoperitoneum.  Status post left nephrectomy. Right kidney shows no hydronephrosis.    There are a few right renal stones. Diverticulosis of colon without  signs of focal diverticulitis. There is fluid in the lumen of right  colon which may be a normal finding are suggestive of nonspecific  colitis. There is no evidence of any significant wall thickening in  colon.    There is 8 mm nodule in right adrenal. There is 12 mm nodule in the  left adrenal.    Recommend 1 year follow up adrenal washout CT. If stable for > 1  year, no further f/u imaging.    Small hiatal hernia.  Significant atherosclerosis.      PROCEDURES:  Critical Care performed: No  Procedures   MEDICATIONS ORDERED IN ED: Medications - No data to display   IMPRESSION  / MDM / [place] / ED COURSE  I reviewed the triage vital signs and the nursing notes.                              Differential diagnosis includes, but is not limited to, gastritis, pancreatitis, perforation.  Patient's presentation is most consistent with acute presentation with potential threat to life or bodily function.  Patient presented to the emergency department today because of concerns for abdominal pain.  She states this has been going on for months.  It is worse after she eats.  Blood work without concerning electrolyte abnormality or leukocytosis.  Did obtain a CT scan which did not show any findings that would be the etiology of the patient's pain.  Patient did feel better after GI cocktail and was able to eat without discomfort afterwards.  At this point I do think gastritis and GERD likely.  I discussed this with the patient.  We will plan on discharging with an acid and sucralfate.  FINAL CLINICAL IMPRESSION(S) / ED DIAGNOSES   Final diagnoses:  Abdominal pain, unspecified abdominal location     Note:  This document was prepared using Dragon voice recognition software and may include unintentional dictation errors.    Nance Pear, MD 07/24/22 2113

## 2022-07-24 NOTE — Discharge Instructions (Signed)
Please seek medical attention for any high fevers, chest pain, shortness of breath, change in behavior, persistent vomiting, bloody stool or any other new or concerning symptoms.  

## 2022-07-29 ENCOUNTER — Other Ambulatory Visit: Payer: Self-pay | Admitting: Internal Medicine

## 2022-07-29 DIAGNOSIS — E782 Mixed hyperlipidemia: Secondary | ICD-10-CM

## 2022-07-29 NOTE — Telephone Encounter (Signed)
Requested Prescriptions  Pending Prescriptions Disp Refills  . simvastatin (ZOCOR) 20 MG tablet [Pharmacy Med Name: SIMVASTATIN 20 MG TABLET] 90 tablet 1    Sig: TAKE 1 TABLET BY MOUTH EVERY DAY     Cardiovascular:  Antilipid - Statins Failed - 07/29/2022  2:28 AM      Failed - Lipid Panel in normal range within the last 12 months    Cholesterol, Total  Date Value Ref Range Status  01/15/2022 140 100 - 199 mg/dL Final   LDL Chol Calc (NIH)  Date Value Ref Range Status  01/15/2022 58 0 - 99 mg/dL Final   HDL  Date Value Ref Range Status  01/15/2022 47 >39 mg/dL Final   Triglycerides  Date Value Ref Range Status  01/15/2022 217 (H) 0 - 149 mg/dL Final         Passed - Patient is not pregnant      Passed - Valid encounter within last 12 months    Recent Outpatient Visits          2 months ago Essential hypertension   [place], MD   6 months ago Benign hypertension   [facility] Glean Hess, MD

## 2022-08-03 ENCOUNTER — Ambulatory Visit
Admission: EM | Admit: 2022-08-03 | Discharge: 2022-08-03 | Disposition: A | Discharge status: Home / Self Care | Payer: Medicare HMO | Attending: Internal Medicine | Admitting: Internal Medicine

## 2022-08-03 ENCOUNTER — Other Ambulatory Visit: Payer: Self-pay | Admitting: Internal Medicine

## 2022-08-03 DIAGNOSIS — R21 Rash and other nonspecific skin eruption: Secondary | ICD-10-CM | POA: Diagnosis not present

## 2022-08-03 DIAGNOSIS — I10 Essential (primary) hypertension: Secondary | ICD-10-CM

## 2022-08-03 MED ORDER — MUPIROCIN 2 % EX OINT: 1.0000 | TOPICAL_OINTMENT | Freq: Two times a day (BID) | CUTANEOUS | 0 refills | Status: DC

## 2022-08-03 MED ORDER — EUCERIN EX CREA: TOPICAL_CREAM | Freq: Two times a day (BID) | CUTANEOUS | 0 refills | Status: DC | PRN

## 2022-08-03 NOTE — ED Provider Notes (Signed)
[facility]    CSN: [id number] Arrival date & time: 08/03/22  6967      History   Chief Complaint Chief Complaint  Patient presents with   Rash    HPI Candice Cook is a 81 y.o. female.  She presents today with rash on her face that appeared yesterday.  Rash is characterized as redness of the cheeks, forehead, around her chin, and right eyelid, without bumps or blisters.  It does not itch or hurt, but it does burn.  No new medications or skin care products, no new oral hygiene products.  She did have a new type of Metamucil OTC product for constipation recently.  She does use oil of Olay moisturizer twice daily.  Affected skin feels warm.  She had a cat scratch to the right eyebrow several days ago, this is not painful or red.  No fever, no new joint pains.  Chronic constipation, abdominal discomfort, recently in the hospital with increased crampiness.  Not short of breath.  No change in energy, but does say she does not have the energy to do things.  Feeling sad today because its her son's birthday; her husband and son died in the last couple of years.   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   Wears dentures    full upper and lower    Patient Active Problem List   Diagnosis Date Noted   Gouty arthritis of both feet 01/15/2022   Mixed hyperlipidemia 01/15/2022   Gait abnormality 01/15/2022   Chronic left-sided low back pain with left-sided sciatica 04/21/2020   Foraminal stenosis of lumbar region 04/21/2020   Lumbar degenerative disc disease  03/20/2020   Osteoporosis, 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   Pessary maintenance 01/11/2018   Vaginal polyp 12/12/2017   Vaginal atrophy 12/12/2017   Cystocele, midline 12/12/2017   Chronic diarrhea 09/15/2017   Chronic renal impairment, 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/30/2016   Mixed incontinence 03/23/2016   B12 deficiency 02/03/2016   High risk medication use 01/15/2016   Primary insomnia 01/15/2016   Vitamin D deficiency, unspecified 01/15/2016   Microalbuminuria 09/23/2015   GERD without esophagitis 05/02/2015   History of cervical cancer 05/02/2015   History of hypothyroidism 05/02/2015   Urge incontinence 05/02/2015   Prediabetes 09/20/2014   Constipation by delayed colonic transit 08/30/2013   Rectocele 08/30/2013   Essential hypertension 07/02/2013    Past Surgical History:  Procedure Laterality Date   ABDOMINAL HYSTERECTOMY     APPENDECTOMY  1993   BROW LIFT Bilateral 02/27/2019   Procedure: BLEPHAROPLASTY UPPER EYELID W/EXCESS SKIN;  Surgeon: Karle Starch, MD;  Location: [place];  Service: Ophthalmology;  Laterality: Bilateral;   BROW PTOSIS Bilateral 02/27/2019   Procedure: BROW PTOSIS REPAIR;  Surgeon: Karle Starch, MD;  Location: [place];  Service: Ophthalmology;  Laterality: Bilateral;  Diabetic - oral meds   CATARACT  EXTRACTION W/PHACO Left 03/22/2017   Procedure: CATARACT EXTRACTION PHACO AND INTRAOCULAR LENS PLACEMENT (IOC) left diabetic;  Surgeon: Eulogio Bear, MD;  Location: [place];  Service: Ophthalmology;  Laterality: Left;  Diabetic   CATARACT EXTRACTION W/PHACO Right 05/17/2017   Procedure: CATARACT EXTRACTION PHACO AND INTRAOCULAR LENS PLACEMENT ([place]) Right diabetic;  Surgeon: Eulogio Bear, MD;  Location: [place];  Service: Ophthalmology;  Laterality: Right;   CHOLECYSTECTOMY     COLONOSCOPY WITH PROPOFOL N/A 11/14/2017   Procedure: COLONOSCOPY  WITH PROPOFOL;  Surgeon: Lollie Sails, MD;  Location[facility] ENDOSCOPY;  Service: Endoscopy;  Laterality: N/A;   EYE SURGERY     cataracts with extraocular prosthesis   KIDNEY SURGERY     gave daughter a kidney   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 Authorizing Provider  mupirocin ointment (BACTROBAN) 2 % Apply 1 Application topically 2 (two) times daily. Thin smear to right eyebrow (area of cat scratch) 08/03/22  Yes Wynona Luna, MD  Skin Protectants, Misc. (EUCERIN) cream Apply topically 2 (two) times daily as needed (cheeks, forehead, chin where it is red/feels dry). Thin smear 1-2 times daily to affected areas.  Do not use any other face creams or soaps until redness improves (anticipate in next 2 weeks) 08/03/22  Yes Wynona Luna, MD  allopurinol (ZYLOPRIM) 100 MG tablet Take 2 tablets (200 mg total) by mouth daily. 01/15/22   Glean Hess, MD  aspirin EC 81 MG tablet Take 81 mg by mouth daily.    [provider]  famotidine (PEPCID) 20 MG tablet Take 1 tablet (20 mg total) by mouth daily. 07/24/22 07/24/23  Nance Pear, MD  gabapentin (NEURONTIN) 300 MG capsule Take 900 mg by mouth at bedtime. Dr[facility] 05/08/22   [provider]  lisinopril-hydrochlorothiazide (ZESTORETIC) 20-12.5 MG tablet Take 1 tablet by mouth daily. 01/15/22   Glean Hess, MD  omeprazole (PRILOSEC) 40 MG capsule Take 1 capsule (40 mg total) by mouth daily. 01/15/22   Glean Hess, MD  sertraline (ZOLOFT) 50 MG tablet Take by mouth. 04/13/22   [provider]  simvastatin (ZOCOR) 20 MG tablet TAKE 1 TABLET BY MOUTH EVERY DAY 07/29/22   Glean Hess,  MD  traZODone (DESYREL) 50 MG tablet TAKE 2 TABLETS BY MOUTH AT BEDTIME. 05/06/22   Glean Hess, MD    Family History Family History  Problem Relation Age of Onset   Heart disease Mother    Heart disease Father    Diabetes Father    Hypertension Father    Cancer Brother    Kidney disease Daughter    Bladder Cancer Neg Hx    Kidney cancer Neg Hx    Breast cancer Neg Hx     Social History Social History   Tobacco Use   Smoking status: Former   Smokeless tobacco: Never  Tobacco comments:    quit 25 years  Vaping Use   Vaping Use: Never used  Substance Use Topics   Alcohol use: No    Alcohol/week: 0.0 standard drinks of alcohol   Drug use: No     Allergies   Mirabegron and Metformin and related   Review of Systems Review of Systems  Skin:  Positive for rash.     Physical Exam Triage Vital Signs ED Triage Vitals  Enc Vitals Group     BP 08/03/22 0949 123/62     Pulse Rate 08/03/22 0949 (!) 58     Resp 08/03/22 0949 19     Temp 08/03/22 0949 98.1 F (36.7 C)     Temp Source 08/03/22 0949 Oral     SpO2 08/03/22 0949 99 %     Weight 08/03/22 0950 98 lb (44.5 kg)     Height 08/03/22 0950 5' (1.524 m)     Head Circumference --      Peak Flow --      Pain Score 08/03/22 0950 0     Pain Loc --      Pain Edu? --      Excl. in [place]? --    No data found.  Updated Vital Signs BP 123/62   Pulse (!) 58   Temp 98.1 F (36.7 C) (Oral)   Resp 19   Ht 5' (1.524 m)   Wt 44.5 kg   SpO2 99%   BMI 19.14 kg/m   Visual Acuity Right Eye Distance:   Left Eye Distance:   Bilateral Distance:    Right Eye Near:   Left Eye Near:    Bilateral Near:     Physical Exam Constitutional:      General: She is not in acute distress.    Appearance: She is not ill-appearing.     Comments: Good hygiene  HENT:     Head: Atraumatic.     Mouth/Throat:     Mouth: Mucous membranes are moist.  Eyes:     Conjunctiva/sclera:     Right eye: Right conjunctiva is not  injected. No exudate.    Left eye: Left conjunctiva is not injected. No exudate.    Comments: Conjugate gaze observed  Neck:     Comments: Turns head freely Cardiovascular:     Rate and Rhythm: Normal rate and regular rhythm.  Pulmonary:     Effort: Pulmonary effort is normal. No respiratory distress.     Breath sounds: No wheezing or rhonchi.  Abdominal:     General: There is no distension.  Musculoskeletal:     Comments: Walked into the urgent care independently  Skin:    General: Skin is warm and dry.     Comments: See photos of rash; diffuse redness as in photos, more violaceous over the right eyelid.  Affected skin feels slightly dry, no significant scale, nor papules nor blisters appreciated.  Neurological:     Mental Status: She is alert.     Comments: Face symmetric, speech clear, coherent, logical           UC Treatments / Results  Labs (all labs ordered are listed, but only abnormal results are displayed) Labs Reviewed - No data to display  EKG   Radiology No results found.  Procedures Procedures (including critical care time)  Medications Ordered in UC Medications - No data to display  Initial Impression / Assessment and Plan / UC Course  I have reviewed the triage vital signs  and the nursing notes.  Pertinent labs & imaging results that were available during my care of the patient were reviewed by me and considered in my medical decision making (see chart for details).     *** Final Clinical Impressions(s) / UC Diagnoses   Final diagnoses:  Rash     Discharge Instructions      Redness on cheeks/chin/forehead is consistent with contact or irritant dermatitis, to unknown substance.  Stop using current face creams/cleansers until redness improves.  Clean face with cool water and pat dry once daily.  Apply thin smear of eucerin to face 1-2 times daily for redness/burning.  Anticipate improvement in the next couple weeks.  Prescriptions for  eucerin (soothing moisturizer) and mupirocin (antibiotic ointment) were sent to the pharmacy.  Would put the mupirocin on your right eyebrow, where the cat scratched you.  The cat scratch does not look infected at present and the antibiotic ointment is preventive.   ED Prescriptions     Medication Sig Dispense Auth. Provider   mupirocin ointment (BACTROBAN) 2 % Apply 1 Application topically 2 (two) times daily. Thin smear to right eyebrow (area of cat scratch) 15 g Wynona Luna, MD   Skin Protectants, Misc. (EUCERIN) cream Apply topically 2 (two) times daily as needed (cheeks, forehead, chin where it is red/feels dry). Thin smear 1-2 times daily to affected areas.  Do not use any other face creams or soaps until redness improves (anticipate in next 2 weeks) 57 g Wynona Luna, MD      PDMP not reviewed this encounter.

## 2022-08-03 NOTE — Discharge Instructions (Addendum)
Redness on cheeks/chin/forehead is consistent with contact or irritant dermatitis, to unknown substance.  Stop using current face creams/cleansers until redness improves.  Clean face with cool water and pat dry once daily.  Apply thin smear of eucerin to face 1-2 times daily for redness/burning.  Anticipate improvement in the next couple weeks.  Prescriptions for eucerin (soothing moisturizer) and mupirocin (antibiotic ointment) were sent to the pharmacy.  Would put the mupirocin on your right eyebrow, where the cat scratched you.  The cat scratch does not look infected at present and the antibiotic ointment is preventive.

## 2022-08-03 NOTE — ED Triage Notes (Addendum)
Patient to Urgent Care with rash present to bilateral cheeks and left eyelid. Reports area appeared yesterday, area is warm and red, describes it as burning but no itching or pain. Reports the only new product she used prior to this was an off-brand metamucil, denies any new lotions/ detergents. Patient also reports cat scratch present to her forehead.

## 2022-08-03 NOTE — Telephone Encounter (Signed)
Requested Prescriptions  Pending Prescriptions Disp Refills  . lisinopril-hydrochlorothiazide (ZESTORETIC) 20-12.5 MG tablet [Pharmacy Med Name: LISINOPRIL-HCTZ 20-12.5 MG TAB] 90 tablet 1    Sig: TAKE 1 TABLET BY MOUTH EVERY DAY     Cardiovascular:  ACEI + Diuretic Combos Failed - 08/03/2022  2:21 AM      Failed - Last BP in normal range    BP Readings from Last 1 Encounters:  08/03/22 123/62         Passed - Na in normal range and within 180 days    Sodium  Date Value Ref Range Status  07/24/2022 139 135 - 145 mmol/L Final  05/17/2022 148 (H) 134 - 144 mmol/L Final  02/11/2014 135 (L) 136 - 145 mmol/L Final         Passed - K in normal range and within 180 days    Potassium  Date Value Ref Range Status  07/24/2022 3.6 3.5 - 5.1 mmol/L Final  02/11/2014 3.3 (L) 3.5 - 5.1 mmol/L Final         Passed - Cr in normal range and within 180 days    Creatinine  Date Value Ref Range Status  02/11/2014 0.98 0.60 - 1.30 mg/dL Final   Creatinine, Ser  Date Value Ref Range Status  07/24/2022 0.99 0.44 - 1.00 mg/dL Final         Passed - eGFR is 30 or above and within 180 days    EGFR (African American)  Date Value Ref Range Status  02/11/2014 >60  Final   GFR calc Af Amer  Date Value Ref Range Status  12/03/2018 >60 >60 mL/min Final   EGFR (Non-African Amer.)  Date Value Ref Range Status  02/11/2014 58 (L)  Final    Comment:    eGFR values <67m/min/1.73 m2 may be an indication of chronic kidney disease (CKD). Calculated eGFR is useful in patients with stable renal function. The eGFR calculation will not be reliable in acutely ill patients when serum creatinine is changing rapidly. It is not useful in  patients on dialysis. The eGFR calculation may not be applicable to patients at the low and high extremes of body sizes, pregnant women, and vegetarians.    GFR, Estimated  Date Value Ref Range Status  07/24/2022 58 (L) >60 mL/min Final    Comment:     (NOTE) Calculated using the CKD-EPI Creatinine Equation (2021)    eGFR  Date Value Ref Range Status  05/17/2022 80 >59 mL/min/1.73 Final         Passed - Patient is not pregnant      Passed - Valid encounter within last 6 months    Recent Outpatient Visits          2 months ago Essential hypertension   [place]   [facility]

## 2022-08-05 ENCOUNTER — Other Ambulatory Visit: Payer: Self-pay | Admitting: Internal Medicine

## 2022-08-05 NOTE — Telephone Encounter (Signed)
Requested medication (s) are due for refill today: historical medication  Requested medication (s) are on the active medication list: yes  Last refill:  05/08/22  Future visit scheduled: no  Notes to clinic:  historical medication . Do you want to order Rx?     Requested Prescriptions  Pending Prescriptions Disp Refills   gabapentin (NEURONTIN) 300 MG capsule [Pharmacy Med Name: GABAPENTIN 300 MG CAPSULE] 270 capsule     Sig: TAKE 3 CAPSULES (900 MG TOTAL) BY MOUTH NIGHTLY FOR BURNING IN FEET     Neurology: Anticonvulsants - gabapentin Passed - 08/05/2022  3:25 PM      Passed - Cr in normal range and within 360 days    Creatinine  Date Value Ref Range Status  02/11/2014 0.98 0.60 - 1.30 mg/dL Final   Creatinine, Ser  Date Value Ref Range Status  07/24/2022 0.99 0.44 - 1.00 mg/dL Final         Passed - Completed PHQ-2 or PHQ-9 in the last 360 days      Passed - Valid encounter within last 12 months    Recent Outpatient Visits           2 months ago Essential hypertension   [place], MD   6 months ago Benign hypertension   [facility] Glean Hess, MD

## 2022-08-08 ENCOUNTER — Other Ambulatory Visit: Payer: Self-pay | Admitting: Internal Medicine

## 2022-08-09 NOTE — Telephone Encounter (Signed)
Requested medication (s) are due for refill today: -  Requested medication (s) are on the active medication list: historical med  Last refill:  05/17/22  Future visit scheduled: no  Notes to clinic:  historical provider-    Requested Prescriptions  Pending Prescriptions Disp Refills   gabapentin (NEURONTIN) 300 MG capsule [Pharmacy Med Name: GABAPENTIN 300 MG CAPSULE] 270 capsule     Sig: TAKE 3 CAPSULES (900 MG TOTAL) BY MOUTH NIGHTLY FOR BURNING IN FEET     Neurology: Anticonvulsants - gabapentin Passed - 08/08/2022  2:05 PM      Passed - Cr in normal range and within 360 days    Creatinine  Date Value Ref Range Status  02/11/2014 0.98 0.60 - 1.30 mg/dL Final   Creatinine, Ser  Date Value Ref Range Status  07/24/2022 0.99 0.44 - 1.00 mg/dL Final         Passed - Completed PHQ-2 or PHQ-9 in the last 360 days      Passed - Valid encounter within last 12 months    Recent Outpatient Visits           2 months ago Essential hypertension   [place], MD   6 months ago Benign hypertension   [facility] Glean Hess, MD

## 2022-10-13 ENCOUNTER — Emergency Department: Payer: Medicare HMO

## 2022-10-13 ENCOUNTER — Encounter: Payer: Self-pay | Admitting: Emergency Medicine

## 2022-10-13 ENCOUNTER — Ambulatory Visit
Admission: EM | Admit: 2022-10-13 | Discharge: 2022-10-13 | Disposition: A | Discharge status: Other Acute Inpatient Hospital | Payer: Medicare HMO | Source: Home / Self Care

## 2022-10-13 DIAGNOSIS — E1142 Type 2 diabetes mellitus with diabetic polyneuropathy: Secondary | ICD-10-CM | POA: Insufficient documentation

## 2022-10-13 DIAGNOSIS — D72829 Elevated white blood cell count, unspecified: Secondary | ICD-10-CM | POA: Insufficient documentation

## 2022-10-13 DIAGNOSIS — Z85828 Personal history of other malignant neoplasm of skin: Secondary | ICD-10-CM | POA: Diagnosis not present

## 2022-10-13 DIAGNOSIS — I10 Essential (primary) hypertension: Secondary | ICD-10-CM | POA: Diagnosis not present

## 2022-10-13 DIAGNOSIS — Z7982 Long term (current) use of aspirin: Secondary | ICD-10-CM | POA: Insufficient documentation

## 2022-10-13 DIAGNOSIS — Z87891 Personal history of nicotine dependence: Secondary | ICD-10-CM | POA: Insufficient documentation

## 2022-10-13 DIAGNOSIS — R112 Nausea with vomiting, unspecified: Secondary | ICD-10-CM | POA: Insufficient documentation

## 2022-10-13 DIAGNOSIS — R1033 Periumbilical pain: Secondary | ICD-10-CM | POA: Insufficient documentation

## 2022-10-13 DIAGNOSIS — G309 Alzheimer's disease, unspecified: Secondary | ICD-10-CM | POA: Insufficient documentation

## 2022-10-13 DIAGNOSIS — K5730 Diverticulosis of large intestine without perforation or abscess without bleeding: Secondary | ICD-10-CM | POA: Diagnosis not present

## 2022-10-13 DIAGNOSIS — F0390 Unspecified dementia without behavioral disturbance: Secondary | ICD-10-CM | POA: Diagnosis not present

## 2022-10-13 DIAGNOSIS — R634 Abnormal weight loss: Secondary | ICD-10-CM | POA: Insufficient documentation

## 2022-10-13 DIAGNOSIS — R109 Unspecified abdominal pain: Secondary | ICD-10-CM | POA: Diagnosis not present

## 2022-10-13 DIAGNOSIS — G8929 Other chronic pain: Secondary | ICD-10-CM | POA: Insufficient documentation

## 2022-10-13 LAB — COMPREHENSIVE METABOLIC PANEL
ALT: 12 U/L (ref 0–44)
ALT: 11 U/L (ref 0–44)
AST: 19 U/L (ref 15–41)
AST: 19 U/L (ref 15–41)
Albumin: 4 g/dL (ref 3.5–5.0)
Albumin: 3.9 g/dL (ref 3.5–5.0)
Alkaline Phosphatase: 42 U/L (ref 38–126)
Alkaline Phosphatase: 43 U/L (ref 38–126)
Anion gap: 5 (ref 5–15)
Anion gap: 8 (ref 5–15)
BUN: 16 mg/dL (ref 8–23)
BUN: 17 mg/dL (ref 8–23)
CO2: 28 mmol/L (ref 22–32)
CO2: 28 mmol/L (ref 22–32)
Calcium: 8.9 mg/dL (ref 8.9–10.3)
Calcium: 9.5 mg/dL (ref 8.9–10.3)
Chloride: 96 mmol/L — ABNORMAL LOW (ref 98–111)
Chloride: 98 mmol/L (ref 98–111)
Creatinine, Ser: 0.73 mg/dL (ref 0.44–1.00)
Creatinine, Ser: 0.84 mg/dL (ref 0.44–1.00)
GFR, Estimated: >60 mL/min (ref >60)
GFR, Estimated: >60 mL/min (ref >60)
Glucose, Bld: 99 mg/dL (ref 70–99)
Glucose, Bld: 101 mg/dL — ABNORMAL HIGH (ref 70–99)
Potassium: 4 mmol/L (ref 3.5–5.1)
Potassium: 4 mmol/L (ref 3.5–5.1)
Sodium: 129 mmol/L — ABNORMAL LOW (ref 135–145)
Sodium: 134 mmol/L — ABNORMAL LOW (ref 135–145)
Total Bilirubin: 0.6 mg/dL (ref 0.3–1.2)
Total Bilirubin: 1 mg/dL (ref 0.3–1.2)
Total Protein: 6.3 g/dL — ABNORMAL LOW (ref 6.5–8.1)
Total Protein: 6.1 g/dL — ABNORMAL LOW (ref 6.5–8.1)

## 2022-10-13 LAB — CBC WITH DIFFERENTIAL/PLATELET
Abs Immature Granulocytes: 0.03 10*3/uL (ref 0.00–0.07)
Abs Immature Granulocytes: 0.03 10*3/uL (ref 0.00–0.07)
Basophils Absolute: 0.1 10*3/uL (ref 0.0–0.1)
Basophils Absolute: 0.1 10*3/uL (ref 0.0–0.1)
Basophils Relative: 0 %
Basophils Relative: 0 %
Eosinophils Absolute: 0.1 10*3/uL (ref 0.0–0.5)
Eosinophils Absolute: 0.2 10*3/uL (ref 0.0–0.5)
Eosinophils Relative: 1 %
Eosinophils Relative: 1 %
HCT: 40.7 % (ref 36.0–46.0)
HCT: 41.2 % (ref 36.0–46.0)
Hemoglobin: 13.6 g/dL (ref 12.0–15.0)
Hemoglobin: 13.7 g/dL (ref 12.0–15.0)
Immature Granulocytes: 0 %
Immature Granulocytes: 0 %
Lymphocytes Relative: 61 %
Lymphocytes Relative: 65 %
Lymphs Abs: 9.9 10*3/uL — ABNORMAL HIGH (ref 0.7–4.0)
Lymphs Abs: 10.2 10*3/uL — ABNORMAL HIGH (ref 0.7–4.0)
MCH: 30 pg (ref 26.0–34.0)
MCH: 29.9 pg (ref 26.0–34.0)
MCHC: 33.4 g/dL (ref 30.0–36.0)
MCHC: 33.3 g/dL (ref 30.0–36.0)
MCV: 89.8 fL (ref 80.0–100.0)
MCV: 90 fL (ref 80.0–100.0)
Monocytes Absolute: 0.5 10*3/uL (ref 0.1–1.0)
Monocytes Absolute: 0.5 10*3/uL (ref 0.1–1.0)
Monocytes Relative: 3 %
Monocytes Relative: 3 %
Neutro Abs: 5.6 10*3/uL (ref 1.7–7.7)
Neutro Abs: 4.9 10*3/uL (ref 1.7–7.7)
Neutrophils Relative %: 35 %
Neutrophils Relative %: 31 %
Platelets: 179 10*3/uL (ref 150–400)
Platelets: 177 10*3/uL (ref 150–400)
RBC: 4.53 MIL/uL (ref 3.87–5.11)
RBC: 4.58 MIL/uL (ref 3.87–5.11)
RDW: 14.5 % (ref 11.5–15.5)
RDW: 14.2 % (ref 11.5–15.5)
Smear Review: NORMAL
WBC Morphology: ABNORMAL
WBC: 16.2 10*3/uL — ABNORMAL HIGH (ref 4.0–10.5)
WBC: 15.9 10*3/uL — ABNORMAL HIGH (ref 4.0–10.5)
nRBC: 0 % (ref 0.0–0.2)
nRBC: 0 % (ref 0.0–0.2)

## 2022-10-13 LAB — URINALYSIS, ROUTINE W REFLEX MICROSCOPIC
Bacteria, UA: NONE SEEN
Bilirubin Urine: NEGATIVE
Glucose, UA: NEGATIVE mg/dL
Hgb urine dipstick: NEGATIVE
Ketones, ur: NEGATIVE mg/dL
Nitrite: NEGATIVE
Protein, ur: NEGATIVE mg/dL
Specific Gravity, Urine: 1.044 — ABNORMAL HIGH (ref 1.005–1.030)
pH: 6 (ref 5.0–8.0)

## 2022-10-13 LAB — LIPASE, BLOOD
Lipase: 56 U/L — ABNORMAL HIGH (ref 11–51)
Lipase: 58 U/L — ABNORMAL HIGH (ref 11–51)

## 2022-10-13 MED ORDER — IOHEXOL 300 MG/ML  SOLN
75.0000 mL | Freq: Once | INTRAMUSCULAR | Status: AC | PRN
  Administered 2022-10-13: 75 mL via INTRAVENOUS

## 2022-10-13 NOTE — ED Triage Notes (Signed)
Pt arrives with c/o ABD pain that started about 3 months ago. Pt endorses n/v. Pt denies CP or SOB. Per pt, she had some abnormal labs too.

## 2022-10-13 NOTE — ED Triage Notes (Signed)
Pt presents with abdominal pain and vomiting off and on since July. She had a work up in the ED and prescribed carafate  and famotidine. Pt states she only took the medication one time but her grandson states she takes the medication. The pain is worse after eating.

## 2022-10-13 NOTE — ED Provider Notes (Signed)
[facility]    CSN: [id number] Arrival date & time: 10/13/22  1724      History   Chief Complaint Chief Complaint  Patient presents with   Abdominal Pain   Emesis    HPI Candice Cook is a 81 y.o. female presenting with grandson for upper abdominal pain and periumbilical cramping for the past 3 months.  It is associated with nausea, vomiting, dry heaving and weight loss of 20 pounds.  Patient also reports a reduced appetite.  She has also had issues with BMs and has to use laxatives to have a BM.  Last BM was yesterday.  She has not had any associated fevers or fatigue.  No urinary symptoms.  Patient has had a colonoscopy in the past but cannot remember when.  She is not sure if it was done in the past 10 years.  She has never had an upper endoscopy.  She did go to the emergency department when the symptoms began a couple of months ago and had a CT scan and lab work and was told she just had gastritis.  She was prescribed antacids.  She does state that she takes the famotidine but is not sure it helps.  She does have a PCP at [facility] but reports they have not done much.  Patient has history significant for anxiety, depression, diabetes, GERD, hyperlipidemia, hypertension.  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   Wears dentures    full upper and lower    Patient Active Problem List   Diagnosis Date Noted   Gouty arthritis of both feet 01/15/2022   Mixed hyperlipidemia 01/15/2022   Gait abnormality 01/15/2022   Chronic left-sided low back  pain with left-sided sciatica 04/21/2020   Foraminal stenosis of lumbar region 04/21/2020   Lumbar degenerative disc disease 03/20/2020   Osteoporosis, 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   Pessary maintenance 01/11/2018   Vaginal polyp 12/12/2017   Vaginal atrophy 12/12/2017   Cystocele, midline 12/12/2017   Chronic diarrhea 09/15/2017   Chronic renal impairment, 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   Alzheimer's dementia without behavioral disturbance ([place]) 09/30/2016   Mixed incontinence 03/23/2016   B12 deficiency 02/03/2016   High risk medication use 01/15/2016   Primary insomnia 01/15/2016   Vitamin D deficiency, unspecified 01/15/2016   Microalbuminuria 09/23/2015   GERD without esophagitis 05/02/2015   History of cervical cancer 05/02/2015   History of hypothyroidism 05/02/2015   Urge incontinence 05/02/2015   Prediabetes 09/20/2014   Constipation by delayed colonic transit 08/30/2013   Rectocele 08/30/2013   Essential hypertension 07/02/2013    Past Surgical History:  Procedure Laterality Date   ABDOMINAL HYSTERECTOMY     APPENDECTOMY  1993   BROW LIFT Bilateral 02/27/2019   Procedure: BLEPHAROPLASTY UPPER EYELID W/EXCESS SKIN;  Surgeon: Karle Starch, MD;  Location: [place];  Service: Ophthalmology;  Laterality: Bilateral;   BROW PTOSIS Bilateral 02/27/2019   Procedure: BROW PTOSIS REPAIR;  Surgeon: Karle Starch, MD;  Location: [place];  Service: Ophthalmology;  Laterality: Bilateral;  Diabetic - oral meds   CATARACT EXTRACTION W/PHACO Left 03/22/2017   Procedure: CATARACT EXTRACTION PHACO AND INTRAOCULAR LENS PLACEMENT ([place]) left diabetic;  Surgeon: Eulogio Bear, MD;  Location: [place];  Service: Ophthalmology;  Laterality: Left;  Diabetic   CATARACT EXTRACTION W/PHACO Right 05/17/2017   Procedure: CATARACT EXTRACTION PHACO AND INTRAOCULAR LENS PLACEMENT ([place]) Right diabetic;  Surgeon: Eulogio Bear, MD;  Location: [place];  Service:  Ophthalmology;  Laterality: Right;   CHOLECYSTECTOMY     COLONOSCOPY WITH PROPOFOL N/A 11/14/2017   Procedure: COLONOSCOPY WITH PROPOFOL;  Surgeon: Lollie Sails, MD;  Location[facility];  Service: Endoscopy;  Laterality: N/A;   EYE SURGERY     cataracts with extraocular prosthesis   KIDNEY SURGERY     gave daughter a kidney   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 Authorizing Provider  allopurinol (ZYLOPRIM) 100 MG tablet Take 2 tablets (200 mg total) by mouth daily. 01/15/22   Glean Hess, MD  aspirin EC 81 MG tablet Take 81 mg by mouth daily.    [provider]  famotidine (PEPCID) 20 MG tablet Take 1 tablet (20 mg total) by mouth daily. 07/24/22 07/24/23  Nance Pear, MD  gabapentin (NEURONTIN) 300 MG capsule Take 900 mg by mouth at bedtime. [facility] 05/08/22   [provider]  lisinopril-hydrochlorothiazide (ZESTORETIC) 20-12.5 MG tablet TAKE 1 TABLET BY MOUTH EVERY DAY 08/03/22   Glean Hess, MD  mupirocin ointment (BACTROBAN) 2 % Apply 1 Application topically 2 (two) times daily. Thin smear to right eyebrow (area of cat scratch) 08/03/22   Wynona Luna, MD  omeprazole (PRILOSEC) 40 MG capsule Take 1 capsule (40 mg total) by mouth daily. 01/15/22   Glean Hess, MD  sertraline (ZOLOFT) 50 MG tablet Take by mouth. 04/13/22   [provider]  simvastatin (ZOCOR) 20 MG tablet TAKE 1 TABLET BY MOUTH EVERY DAY 07/29/22   Glean Hess, MD  Skin Protectants, Misc. (EUCERIN) cream Apply topically 2 (two) times daily as needed (cheeks, forehead, chin where it is red/feels dry). Thin smear 1-2 times daily to affected areas.  Do  not use any other face creams or soaps until redness improves (anticipate in next 2 weeks) 08/03/22   Wynona Luna, MD  traZODone (DESYREL) 50 MG tablet TAKE 2 TABLETS BY MOUTH AT BEDTIME. 05/06/22   Glean Hess, MD    Family History Family History  Problem Relation Age of Onset   Heart disease Mother    Heart disease Father    Diabetes Father    Hypertension Father    Cancer Brother    Kidney disease Daughter    Bladder Cancer Neg Hx    Kidney cancer Neg Hx    Breast  cancer Neg Hx     Social History Social History   Tobacco Use   Smoking status: Former   Smokeless tobacco: Never   Tobacco comments:    quit 25 years  Vaping Use   Vaping Use: Never used  Substance Use Topics   Alcohol use: No    Alcohol/week: 0.0 standard drinks of alcohol   Drug use: No     Allergies   Mirabegron and Metformin and related   Review of Systems Review of Systems  Constitutional:  Positive for appetite change and unexpected weight change. Negative for chills, diaphoresis, fatigue and fever.  Respiratory:  Negative for shortness of breath.   Cardiovascular:  Negative for chest pain.  Gastrointestinal:  Positive for abdominal pain, constipation, nausea and vomiting. Negative for blood in stool, diarrhea and rectal pain.  Genitourinary:  Negative for difficulty urinating, dysuria, hematuria and pelvic pain.  Musculoskeletal:  Negative for myalgias.  Neurological:  Negative for dizziness, weakness and headaches.     Physical Exam Triage Vital Signs ED Triage Vitals  Enc Vitals Group     BP 10/13/22 1755 (!) 146/73     Pulse Rate 10/13/22 1755 72     Resp 10/13/22 1755 16     Temp 10/13/22 1755 98.5 F (36.9 C)     Temp Source 10/13/22 1755 Oral     SpO2 10/13/22 1755 99 %     Weight --      Height --      Head Circumference --      Peak Flow --      Pain Score 10/13/22 1753 0     Pain Loc --      Pain Edu? --      Excl. in [place]? --    No data found.  Updated  Vital Signs BP (!) 146/73 (BP Location: Right Arm)   Pulse 72   Temp 98.5 F (36.9 C) (Oral)   Resp 16   SpO2 99%   Physical Exam Vitals and nursing note reviewed.  Constitutional:      General: She is not in acute distress.    Appearance: Normal appearance. She is not ill-appearing or toxic-appearing.     Comments: Well dressed and clean.  Thin.  HENT:     Head: Normocephalic and atraumatic.     Nose: Nose normal.     Mouth/Throat:     Mouth: Mucous membranes are moist.     Pharynx: Oropharynx is clear.  Eyes:     General: No scleral icterus.       Right eye: No discharge.        Left eye: No discharge.     Conjunctiva/sclera: Conjunctivae normal.  Cardiovascular:     Rate and Rhythm: Normal rate and regular rhythm.     Heart sounds: Normal heart sounds.  Pulmonary:     Effort: Pulmonary effort is normal. No respiratory distress.     Breath sounds: Normal breath sounds.  Abdominal:     Tenderness: There is abdominal tenderness in the periumbilical area.  Musculoskeletal:     Cervical back: Neck supple.  Skin:    General: Skin is dry.  Neurological:     General: No focal deficit present.     Mental Status: She is alert. Mental status is at baseline.     Motor: No weakness.     Gait: Gait normal.  Psychiatric:        Mood and Affect: Mood normal.  Behavior: Behavior normal.        Thought Content: Thought content normal.      UC Treatments / Results  Labs (all labs ordered are listed, but only abnormal results are displayed) Labs Reviewed  COMPREHENSIVE METABOLIC PANEL - Abnormal; Notable for the following components:      Result Value   Sodium 129 (*)    Chloride 96 (*)    Total Protein 6.3 (*)    All other components within normal limits  CBC WITH DIFFERENTIAL/PLATELET - Abnormal; Notable for the following components:   WBC 16.2 (*)    All other components within normal limits  LIPASE, BLOOD - Abnormal; Notable for the following components:    Lipase 56 (*)    All other components within normal limits    EKG   Radiology No results found.  Procedures Procedures (including critical care time)  Medications Ordered in UC Medications - No data to display  Initial Impression / Assessment and Plan / UC Course  I have reviewed the triage vital signs and the nursing notes.  Pertinent labs & imaging results that were available during my care of the patient were reviewed by me and considered in my medical decision making (see chart for details).   81 year old female presents to grandson for approximately 24-monthhistory of generalized but more slight upper abdominal pain, nausea/vomiting, dry heaving and unintentional weight loss of 20 pounds as well as decreased appetite.  Symptoms have recently worsened.  No associated fever.  [facility] had [facility] and was put on antacids which have not really helped.  She does have a history of GERD, diabetes, hypertension.  BP elevated at 146/73.  Other vitals normal and stable.  Patient is overall well-appearing.  On exam chest is clear auscultation heart regular rate and rhythm.  Abdomen is soft and she has periumbilical tenderness.  No CVA tenderness.  Reviewed patient's office visit from July 2023 as well as her lab work at that time.  CBC, CMP and lipase obtained today.  Advised patient with any acute abnormalities, may recommend going to emergency department.  If labs are reassuring, can place referral to GI for endoscopy/colonoscopy and set her up with a PCP as well as try her on Zofran and continue the antacids for gastritis.  CBC shows WBC count of 16.2.  Patient's WBC count in July was normal.  Her sodium was also decreased of 129.  Sodium was also normal couple months ago.  Additionally there is a slight increase in her lipase to 56.  Discussed lab results with patient and her grandson.  Advised that she needs to go to the  emergency department this time for further work-up likely to include CT scan and possible GI consult.  Patient really does need endoscopy/colonoscopy performed.  I have some concern for possible malignancy but it is also possible she could have gastritis/GERD/pud/pancreatitis.  She has had a cholecystectomy so no concern for gallbladder disease.  Patient is reluctantly agreeable to go to ED.  Her grandson is taking her and she is leaving in stable condition at this time.   Final Clinical Impressions(s) / UC Diagnoses   Final diagnoses:  Periumbilical abdominal pain  Leukocytosis, unspecified type  Nausea and vomiting, unspecified vomiting type  Unintentional weight loss     Discharge Instructions      You have been advised to follow up immediately in the emergency department for concerning signs.symptoms. If  you declined EMS transport, please have a family member take you directly to the ED at this time. Do not delay. Based on concerns about condition, if you do not follow up in th e ED, you may risk poor outcomes including worsening of condition, delayed treatment and potentially life threatening issues. If you have declined to go to the ED at this time, you should call your PCP immediately to set up a follow up appointment.  Go to ED for red flag symptoms, including; fevers you cannot reduce with Tylenol/Motrin, severe headaches, vision changes, numbness/weakness in part of the body, lethargy, confusion, intractable vomiting, severe dehydration, chest pain, breathing difficulty, severe persistent abdominal or pelvic pain, signs of severe infection (increased redness, swelling of an area), feeling faint or passing out, dizziness, etc. You should especially go to the ED for sudden acute worsening of condition if you do not elect to go at this time.      ED Prescriptions   None    PDMP not reviewed this encounter.   Danton Clap, PA-C 10/13/22 1909

## 2022-10-13 NOTE — ED Notes (Signed)
Patient is being discharged from the Urgent Care and sent to the Emergency Department via POV . Per Laurene Footman, NP, patient is in need of higher level of care due to abdominal pain. Patient is aware and verbalizes understanding of plan of care.  Vitals:   10/13/22 1755  BP: (!) 146/73  Pulse: 72  Resp: 16  Temp: 98.5 F (36.9 C)  SpO2: 99%

## 2022-10-13 NOTE — Discharge Instructions (Signed)

## 2022-10-13 NOTE — ED Provider Triage Note (Signed)
Emergency Medicine Provider Triage Evaluation Note  Candice Cook , a 81 y.o. female  was evaluated in triage.  Pt complains of abdominal pain.  Sent by urgent care for imaging.  Review of Systems  Positive:  Negative:   Physical Exam  BP (!) 177/68 (BP Location: Left Arm)   Pulse 67   Temp 98.1 F (36.7 C) (Oral)   Resp 16   Ht 5' (1.524 m)   Wt 44.5 kg   SpO2 99%   BMI 19.14 kg/m  Gen:   Awake, no distress   Resp:  Normal effort  MSK:   Moves extremities without difficulty  Other:    Medical Decision Making  Medically screening exam initiated at 8:18 PM.  Appropriate orders placed.  Candice Cook was informed that the remainder of the evaluation will be completed by another provider, this initial triage assessment does not replace that evaluation, and the importance of remaining in the ED until their evaluation is complete.     Versie Starks, PA-C 10/13/22 2018

## 2022-10-14 ENCOUNTER — Emergency Department
Admission: EM | Admit: 2022-10-14 | Discharge: 2022-10-14 | Disposition: A | Discharge status: Home / Self Care | Payer: Medicare HMO | Attending: Emergency Medicine | Admitting: Emergency Medicine

## 2022-10-14 DIAGNOSIS — G8929 Other chronic pain: Secondary | ICD-10-CM

## 2022-10-14 LAB — PATHOLOGIST SMEAR REVIEW

## 2022-10-14 MED ORDER — DOCUSATE SODIUM 100 MG PO CAPS: 100.0000 mg | ORAL_CAPSULE | Freq: Two times a day (BID) | ORAL | 2 refills | Status: AC

## 2022-10-14 MED ORDER — DICYCLOMINE HCL 10 MG PO CAPS: 10.0000 mg | ORAL_CAPSULE | Freq: Three times a day (TID) | ORAL | 0 refills | Status: DC | PRN

## 2022-10-14 MED ORDER — ONDANSETRON 4 MG PO TBDP: 4.0000 mg | ORAL_TABLET | Freq: Four times a day (QID) | ORAL | 0 refills | Status: DC | PRN

## 2022-10-14 NOTE — Discharge Instructions (Addendum)
Steps to find a Primary Care Provider (PCP):  Call [phone] or [phone] to access "[facility]."  2.  You may also go on the [place] website at [email or web address]   Your Ct showed:  Abnormal  See below: Negative     1.025     Collection Time Result Time PH, UR COLOR U  10/13/22 21:40:00 10/13/22 22:11:19    Previous Results  07/24/22 19:52:00 07/24/22 20:12:34    12/18/19 09:48:00 12/18/19 10:02:23    04/13/19 10:28:00 04/13/19 10:57:11    08/10/16 13:20:00 08/10/16 13:57:22    03/23/16 08:55:00 03/23/16 11:39:00    03/23/16 08:55:00 03/23/16 11:39:00 6.0 Yellow      Final result                   Contains abnormal data Comprehensive metabolic panel (Final result)  Component (Lab Inquiry) Collection Time Result Time NA K CL CO2 GLUCOSE BUN Creatinine, Ser CALCIUM PROTEIN Albumin  10/13/22 20:06:00 10/13/22 20:38:14 134 Low  4.0 98 28 101  Glucose reference rang... High  17 0.84 9.5 6.1 Low  3.9  Previous Results  10/13/22 18:26:00 10/13/22 18:53:42 129 Low  4.0 96 Low  28 99  Glucose reference rang... 16 0.73 8.9 6.3 Low  4.0  07/24/22 16:00:00 07/24/22 16:14:42 139 3.6 105 29 126  Glucose reference rang... High  25 High  0.99 9.0 6.0 Low  3.8  05/17/22 09:21:00 05/18/22 05:36:00 148 High  4.3 108 High  27 106 High  10 0.76 8.9 5.3 Low  3.7  01/15/22 15:48:00 01/16/22 05:36:00 139 3.7 99 27 153 High  11 0.93 9.0 5.5 Low  4.0  03/11/20 14:08:00 03/11/20 14:10:45       0.90        Collection Time Result Time AST ALT ALK PHOS BILI TOTL GFR, Estimated Anion gap  10/13/22 20:06:00 10/13/22 20:38:14 19 11  43 1.0 >60  (NOTE)  Calculated usi... 8  Performed at [facility] ...  Previous Results  10/13/22 18:26:00 10/13/22 18:53:42 19 12 42 0.6 >60  (NOTE)  Calculated usi... 5  Performed at [place]...  07/24/22 16:00:00 07/24/22 16:14:42 22 15 38 0.8 58  (NOTE)  Calculated usi... Low  5  Performed at [facility] ...   05/17/22 09:21:00 05/18/22 05:36:00 21 13 49 0.4    01/15/22 15:48:00 01/16/22 05:36:00 18 12 56 0.4    03/11/20 14:08:00 03/11/20 14:10:45            Final result                   Contains abnormal data Lipase, blood (Final result)  Component (Lab Inquiry) Collection Time Result Time LIPASE  10/13/22 20:06:00 10/13/22 20:38:14 58  Performed at [place] ... High   Previous Results  10/13/22 18:26:00 10/13/22 18:53:42 56  Performed at [place]... High   07/24/22 16:00:00 07/24/22 16:14:42 52  Performed at [facility] ... High       Final result                   Contains abnormal data CBC with Differential (Final result)  Component (Lab Inquiry) Collection Time Result Time WBC RBC HGB HCT MCV MCH MCHC RDW PLT nRBC  10/13/22 20:06:00 10/13/22 21:15:09 15.9 High  4.58 13.7 41.2 90.0 29.9 33.3 14.2 177 0.0  Previous Results  10/13/22 18:26:00 10/13/22 19:12:13 16.2 High  4.53 13.6 40.7 89.8 30.0 33.4 14.5 179 0.0  07/24/22 16:00:00 07/24/22 16:27:39 10.4 4.36  12.9 39.5 90.6 29.6 32.7 13.9 129 Low  0.0  01/15/22 15:48:00 01/16/22 05:36:00 13.0 High  4.03 12.2 36.2 90 30.3 33.7 13.5 140 Low    12/03/18 05:40:00 12/03/18 06:35:47 4.3 4.28 12.6 38.9 90.9 29.4 32.4 14.5 117  Immature Platelet Frac... Low  0.0  Performed at [facility] ...  12/02/18 10:07:00 12/02/18 10:29:59 6.6 4.96 14.5 45.0 90.7 29.2 32.2 14.5 137 Low  0.0     Collection Time Result Time NEUTRO PCT AB NEUTRO LYMPHO PCT AB LYM MONO PCT MONO ABS EOS PCT EOSINO ABS BASOS PCT BASOS ABS  10/13/22 20:06:00 10/13/22 21:15:09 31 4.9 65 10.2 High  3 0.5 1 0.2 0 0.1  Previous Results  10/13/22 18:26:00 10/13/22 19:12:13 35 5.6 61 9.9 High  3 0.5 1 0.1 0 0.1  07/24/22 16:00:00 07/24/22 16:27:39 33 3.4 61 6.4 High  4 0.4 1 0.1 1 0.1  01/15/22 15:48:00 01/16/22 05:36:00 38 5.0  7.2 High       0.1  12/03/18 05:40:00 12/03/18 06:35:47            12/02/18 10:07:00 12/02/18 10:29:59 64 4.3 24 1.6 10 0.6 1 0.0 1 0.0      Collection Time Result Time Immature Granulocytes Abs Immature Granulocytes WBC Morphology Immature Grans (Abs) RBC Morphology Smear Review Lymphs Monocytes Eos Basos  10/13/22 20:06:00 10/13/22 21:15:09 0 0.03  Performed at [facility] ...          Previous Results  10/13/22 18:26:00 10/13/22 19:12:13 0 0.03  Performed at [place]... Abnormal lymphocytes present  MORPHOLOGY UNREMARKABLE Normal platelet morphology      07/24/22 16:00:00 07/24/22 16:27:39 0 0.02  Performed at [facility] ... MORPHOLOGY UNREMARKABLE  MORPHOLOGY UNREMARKABLE Normal platelet morphology      01/15/22 15:48:00 01/16/22 05:36:00 0   0.0   56 4 1 1   12/03/18 05:40:00 12/03/18 06:35:47            12/02/18 10:07:00 12/02/18 10:29:59 0 0.02  Performed at [facility] ...             Collection Time Result Time MONOS ABS EOS (ABSOLUTE)  10/13/22 20:06:00 10/13/22 21:15:09    Previous Results  10/13/22 18:26:00 10/13/22 19:12:13    07/24/22 16:00:00 07/24/22 16:27:39    01/15/22 15:48:00 01/16/22 05:36:00 0.5 0.2  12/03/18 05:40:00 12/03/18 06:35:47    12/02/18 10:07:00 12/02/18 10:29:59        Final result                  Pathologist smear review (In process) Result time 10/13/22 21:33:33 Imaging Results     CT ABDOMEN PELVIS W CONTRAST (Final result) Result time 10/13/22 21:03:38 Final result by Madie Reno, MD (10/13/22 21:03:38)           Narrative:   CLINICAL DATA:  Abdomen pain vomiting   EXAM:  CT ABDOMEN AND PELVIS WITH CONTRAST   TECHNIQUE:  Multidetector CT imaging of the abdomen and pelvis was performed  using the standard protocol following bolus administration of  intravenous contrast.   RADIATION DOSE REDUCTION: This exam was performed according to the  departmental dose-optimization program which includes automated  exposure control, adjustment of the mA and/or kV according to  patient size and/or use of iterative reconstruction technique.   CONTRAST:  36m OMNIPAQUE IOHEXOL  300 MG/ML  SOLN   COMPARISON:  07/24/2022   FINDINGS:  Lower chest: Lung bases demonstrate no acute airspace disease.   Hepatobiliary: Subcentimeter hypodensities in the liver too small  to  further characterize. Calcified granuloma. Status post  cholecystectomy. No biliary dilatation   Pancreas: Unremarkable. No pancreatic ductal dilatation or  surrounding inflammatory changes.   Spleen: Normal in size without focal abnormality.   Adrenals/Urinary Tract: Adrenal glands 10 mm right adrenal nodule  with density value of 96. Left 15 mm adrenal nodule with density  value of 152. Status post left nephrectomy. Linear calcification in  the mid to upper right kidney. 1.7 cm hyperenhancing area in the  upper pole right kidney, series 2, image 17, relative hypodensity on  delayed views, series 7, image 11, raising concern for enhancing  renal mass. The bladder is unremarkable   Stomach/Bowel: The stomach is nonenlarged. There is no dilated small  bowel. Scattered fluid and stool in the colon. No acute bowel wall  thickening. Diverticular disease of the left colon.   Vascular/Lymphatic: Advanced aortic atherosclerosis. No aneurysm. No  suspicious lymph nodes.   Reproductive: Status post hysterectomy. No adnexal masses.   Other: Negative for pelvic effusion or free air.   Musculoskeletal: No acute osseous abnormality.   IMPRESSION:  1. No CT evidence for acute intra-abdominal or pelvic abnormality.  2. Status post left nephrectomy. Possible solid enhancing mass in  the upper right kidney. When the patient is clinically stable and  able to follow directions and hold their breath (preferably as an  outpatient) further evaluation with dedicated abdominal MRI should  be considered.  3. 10 mm right adrenal nodule with density value of 96. This  probably represents an adenoma, 1 year follow-up adrenal washout CT  is recommended, if stable greater than or equal to 1 year, no  further  imaging follow-up is recommended  4. 15 mm left adrenal nodule with density value of 152. This may  represent possible pheochromocytoma, recommend biochemical lab  evaluation, if laboratory values are normal, 1 year follow-up  adrenal washout CT is recommended. If stable greater than or equal  to 1 year, no further follow-up imaging is recommended.           You need an abdominal MRI not emergently as an outpatient.  This can be scheduled by a primary care doctor.   You may take Tylenol 1000 mg every 6 hours as needed over-the-counter for pain control.

## 2022-10-15 ENCOUNTER — Telehealth: Payer: Self-pay

## 2022-10-15 NOTE — Telephone Encounter (Signed)
Transition Care Management Unsuccessful Follow-up Telephone Call  Date of discharge and from where:  10/13/2022 [facility]  Attempts:  1st Attempt  Reason for unsuccessful TCM follow-up call:  Left voice message

## 2022-10-15 NOTE — Telephone Encounter (Signed)
Transition Care Management Unsuccessful Follow-up Telephone Call  Date of discharge and from where:  [facility] 10/14/2022  Attempts:  3rd Attempt  Reason for unsuccessful TCM follow-up call:  Unable to reach patient

## 2022-10-15 NOTE — Telephone Encounter (Signed)
Transition Care Management Unsuccessful Follow-up Telephone Call  Date of discharge and from where:  [facility] 10/14/2022  Attempts:  2nd Attempt  Reason for unsuccessful TCM follow-up call:  Left voice message

## 2022-10-26 ENCOUNTER — Ambulatory Visit: Payer: Medicare HMO | Admitting: Internal Medicine

## 2022-10-27 ENCOUNTER — Telehealth: Payer: Self-pay | Admitting: Internal Medicine

## 2022-10-27 ENCOUNTER — Ambulatory Visit (INDEPENDENT_AMBULATORY_CARE_PROVIDER_SITE_OTHER): Payer: Medicare HMO | Admitting: Internal Medicine

## 2022-10-27 ENCOUNTER — Encounter: Payer: Self-pay | Admitting: Internal Medicine

## 2022-10-27 VITALS — BP 128/68 | HR 66 | Ht 60.0 in | Wt 96.0 lb

## 2022-10-27 DIAGNOSIS — I10 Essential (primary) hypertension: Secondary | ICD-10-CM

## 2022-10-27 DIAGNOSIS — R109 Unspecified abdominal pain: Secondary | ICD-10-CM

## 2022-10-27 DIAGNOSIS — Z23 Encounter for immunization: Secondary | ICD-10-CM

## 2022-10-27 DIAGNOSIS — G8929 Other chronic pain: Secondary | ICD-10-CM

## 2022-10-27 DIAGNOSIS — E278 Other specified disorders of adrenal gland: Secondary | ICD-10-CM

## 2022-10-27 NOTE — Patient Instructions (Signed)
Try the Dicyclomine three times a day.  Collect all your urine for 24 hours.

## 2022-10-27 NOTE — Progress Notes (Signed)
Date:  10/27/2022   Name:  Candice Cook   DOB:  Jul 13, 1941   MRN:  [id number]  Patient is here today with her grandson Candice Cook.  Chief Complaint: Abdominal Pain (Wants referral to GI, vomitting)  Abdominal Pain This is a new problem. Episode onset: X3-4 months. The onset quality is sudden. The problem occurs intermittently. The problem has been gradually worsening. The pain is located in the generalized abdominal region. The pain is at a severity of 10/10. The pain is mild. The quality of the pain is cramping. The abdominal pain radiates to the back. Associated symptoms include constipation, headaches, nausea, vomiting (only twice - clear fluid, no food) and weight loss. Pertinent negatives include no diarrhea or fever. Nothing aggravates the pain. Relieved by: laying down. She has tried antacids (laxative) for the symptoms. The treatment provided mild relief.    Lab Results  Component Value Date   NA 134 (L) 10/13/2022   K 4.0 10/13/2022   CO2 28 10/13/2022   GLUCOSE 101 (H) 10/13/2022   BUN 17 10/13/2022   CREATININE 0.84 10/13/2022   CALCIUM 9.5 10/13/2022   EGFR 80 05/17/2022   GFRNONAA >60 10/13/2022   Lab Results  Component Value Date   CHOL 140 01/15/2022   HDL 47 01/15/2022   LDLCALC 58 01/15/2022   TRIG 217 (H) 01/15/2022   CHOLHDL 3.0 01/15/2022   No results found for: "TSH" Lab Results  Component Value Date   HGBA1C 5.8 (H) 05/17/2022   Lab Results  Component Value Date   WBC 15.9 (H) 10/13/2022   HGB 13.7 10/13/2022   HCT 41.2 10/13/2022   MCV 90.0 10/13/2022   PLT 177 10/13/2022   Lab Results  Component Value Date   ALT 11 10/13/2022   AST 19 10/13/2022   ALKPHOS 43 10/13/2022   BILITOT 1.0 10/13/2022   Lab Results  Component Value Date   VD25OH 44.7 05/17/2022     Review of Systems  Constitutional:  Positive for appetite change and weight loss. Negative for chills, diaphoresis, fatigue and fever.  HENT:  Negative for trouble swallowing.    Respiratory:  Negative for chest tightness and shortness of breath.   Cardiovascular:  Negative for chest pain and leg swelling.  Gastrointestinal:  Positive for abdominal pain, constipation, nausea and vomiting (only twice - clear fluid, no food). Negative for blood in stool and diarrhea.  Neurological:  Positive for headaches. Negative for dizziness.  Psychiatric/Behavioral:  Positive for decreased concentration. Negative for dysphoric mood and sleep disturbance. The patient is not nervous/anxious.    Abd CT10/18/23 IMPRESSION: 1. No CT evidence for acute intra-abdominal or pelvic abnormality. 2. Status post left nephrectomy. Possible solid enhancing mass in the upper right kidney. When the patient is clinically stable and able to follow directions and hold their breath (preferably as an outpatient) further evaluation with dedicated abdominal MRI should be considered. 3. 10 mm right adrenal nodule with density value of 96. This probably represents an adenoma, 1 year follow-up adrenal washout CT is recommended, if stable greater than or equal to 1 year, no further imaging follow-up is recommended 4. 15 mm left adrenal nodule with density value of 152. This may represent possible pheochromocytoma, recommend biochemical lab evaluation, if laboratory values are normal, 1 year follow-up adrenal washout CT is recommended. If stable greater than or equal to 1 year, no further follow-up imaging is recommended.    Patient Active Problem List   Diagnosis Date Noted   Gouty  arthritis of both feet 01/15/2022   Mixed hyperlipidemia 01/15/2022   Gait abnormality 01/15/2022   Chronic left-sided low back pain with left-sided sciatica 04/21/2020   Foraminal stenosis of lumbar region 04/21/2020   Lumbar degenerative disc disease 03/20/2020   Osteoporosis, 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   Pessary maintenance 01/11/2018   Vaginal polyp 12/12/2017   Vaginal atrophy 12/12/2017   Cystocele, midline  12/12/2017   Chronic diarrhea 09/15/2017   Chronic renal impairment, 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   Alzheimer's dementia without behavioral disturbance ([place]) 09/30/2016   Mixed incontinence 03/23/2016   B12 deficiency 02/03/2016   High risk medication use 01/15/2016   Primary insomnia 01/15/2016   Vitamin D deficiency, unspecified 01/15/2016   Microalbuminuria 09/23/2015   GERD without esophagitis 05/02/2015   History of cervical cancer 05/02/2015   History of hypothyroidism 05/02/2015   Urge incontinence 05/02/2015   Prediabetes 09/20/2014   Constipation by delayed colonic transit 08/30/2013   Rectocele 08/30/2013   Essential hypertension 07/02/2013    Allergies  Allergen Reactions   Mirabegron Other (See Comments)    Severe tremor, headache   Metformin And Related Diarrhea    Past Surgical History:  Procedure Laterality Date   ABDOMINAL HYSTERECTOMY     APPENDECTOMY  1993   BROW LIFT Bilateral 02/27/2019   Procedure: BLEPHAROPLASTY UPPER EYELID W/EXCESS SKIN;  Surgeon: Karle Starch, MD;  Location: [place];  Service: Ophthalmology;  Laterality: Bilateral;   BROW PTOSIS Bilateral 02/27/2019   Procedure: BROW PTOSIS REPAIR;  Surgeon: Karle Starch, MD;  Location: [place];  Service: Ophthalmology;  Laterality: Bilateral;  Diabetic - oral meds   CATARACT EXTRACTION W/PHACO Left 03/22/2017   Procedure: CATARACT EXTRACTION PHACO AND INTRAOCULAR LENS PLACEMENT ([place]) left diabetic;  Surgeon: Eulogio Bear, MD;  Location: [place];  Service: Ophthalmology;  Laterality: Left;  Diabetic   CATARACT EXTRACTION W/PHACO Right 05/17/2017   Procedure: CATARACT EXTRACTION PHACO AND INTRAOCULAR LENS PLACEMENT ([place]) Right diabetic;  Surgeon: Eulogio Bear, MD;  Location: [place];  Service: Ophthalmology;  Laterality: Right;   CHOLECYSTECTOMY     COLONOSCOPY WITH PROPOFOL N/A 11/14/2017   Procedure: COLONOSCOPY WITH PROPOFOL;  Surgeon: Lollie Sails, MD;  Location: [facility] ENDOSCOPY;  Service: Endoscopy;  Laterality: N/A;   EYE SURGERY     cataracts with extraocular prosthesis   KIDNEY SURGERY     gave daughter a kidney   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  Current Meds  Medication Sig   allopurinol (ZYLOPRIM) 100 MG tablet Take 2 tablets (200 mg total) by mouth daily.   aspirin EC 81 MG tablet Take 81 mg by mouth daily.   famotidine (PEPCID) 20 MG tablet Take 1 tablet (20 mg total) by mouth daily.   gabapentin (NEURONTIN) 300 MG capsule Take 900 mg by mouth at bedtime. [facility]   lisinopril-hydrochlorothiazide (ZESTORETIC) 20-12.5 MG tablet TAKE 1 TABLET BY MOUTH EVERY DAY   omeprazole (PRILOSEC) 40 MG capsule Take 1 capsule (40 mg total) by mouth daily.   ondansetron (ZOFRAN-ODT)  4 MG disintegrating tablet Take 1 tablet (4 mg total) by mouth every 6 (six) hours as needed for nausea or vomiting.   simvastatin (ZOCOR) 20 MG tablet TAKE 1 TABLET BY MOUTH EVERY DAY   Skin Protectants, Misc. (EUCERIN) cream Apply topically 2 (two) times daily as needed (cheeks, forehead, chin where it is red/feels dry). Thin smear 1-2 times daily to affected areas.  Do not use any other face creams or soaps until redness improves (anticipate in next 2 weeks)   [DISCONTINUED] dicyclomine (BENTYL) 10 MG capsule Take 1 capsule (10 mg total) by mouth every 8 (eight) hours as needed for up to 15 doses for spasms.       10/27/2022    1:33 PM 05/17/2022    8:16 AM 01/15/2022    2:53 PM  GAD 7 : Generalized Anxiety Score  Nervous, Anxious, on Edge _0 Control/stop worrying _1 Worry too much -  different things _2 Trouble relaxing _3 Restless _4 Easily annoyed or irritable _5 Afraid - awful might happen 1 0 1  Total GAD 7 Score _6 Anxiety Difficulty Very difficult         10/27/2022    1:32 PM 05/17/2022    8:15 AM 01/15/2022    2:53 PM  Depression screen PHQ 2/9  Decreased Interest _7 Down, Depressed, Hopeless _8 PHQ - 2 Score _9 Altered sleeping _10 Tired, decreased energy _11 Change in appetite _12 Feeling bad or failure about yourself  _13 Trouble concentrating 0 1 2  Moving slowly or fidgety/restless 2 0 2  Suicidal thoughts 0 0 0  PHQ-9 Score _14 Difficult doing work/chores Extremely dIfficult  Very difficult    BP Readings from Last 3 Encounters:  10/27/22 128/68  10/14/22 (!) 155/60  10/13/22 (!) 146/73    Physical Exam Vitals and nursing note reviewed.  Constitutional:      General: She is not in acute distress.    Appearance: She is well-developed.  HENT:     Head: Normocephalic and atraumatic.  Cardiovascular:     Rate and Rhythm: Normal rate and regular rhythm.     Heart sounds: Normal heart sounds.  Pulmonary:     Effort: Pulmonary effort is normal. No respiratory distress.     Breath sounds: Normal breath sounds. No decreased breath sounds or wheezing.  Abdominal:     General: Abdomen is flat. Bowel sounds are normal.     Palpations: Abdomen is soft.     Tenderness: There is no abdominal tenderness. There is no right CVA tenderness, left CVA tenderness, guarding or rebound.  Skin:    General: Skin is warm and dry.     Findings: No rash.  Neurological:     Mental Status: She is alert and oriented to person, place, and time.  Psychiatric:        Mood and Affect: Mood normal.        Behavior: Behavior normal.     Wt Readings from Last 3 Encounters:  10/27/22 96 lb (43.5 kg)  10/13/22 98 lb (44.5 kg)  08/03/22 98 lb (44.5 kg)    BP 128/68   Pulse 66   Ht 5' (1.524 m)   Wt 96 lb  (43.5 kg)   SpO2  97%   BMI 18.75 kg/m   Assessment and Plan: 1. Essential hypertension Clinically stable exam with well controlled BP today. BP has been high at other encounters but she does not check it at home to know if it is fluctuating. Tolerating medications without side effects at this time.  2. Chronic abdominal pain Three trips to the ED over the past few months. Recent CT suggestive of a pheochromocytoma and the episodic pain could be consistent with this presentation.  She denies flushing and does not check BP at home. Recommend that she try the Bentyl given at last ED encounter (she chose not to try it at that time).  Her grandson will make sure she gives it a try. Will refer to GI -  - Ambulatory referral to Gastroenterology  3. Adrenal hypertrophy (HCC) Will attempt a 24 hour urine collection - Ambulatory referral to Gastroenterology - Catecholamine+VMA, 24-Hr Urine - Metanephrines, Urine, 24 hour   Partially dictated using Editor, commissioning. Any errors are unintentional.  Halina Maidens, MD [facility]  10/27/2022

## 2022-10-27 NOTE — Telephone Encounter (Signed)
Copied from [place] [id number]. Topic: Medicare AWV >> Oct 27, 2022 10:17 AM Jae Dire wrote: Reason for CRM:  No answer unable to leave a message for patient to call back and schedule Medicare Annual Wellness Visit (AWV) in office.   If unable to come into the office for AWV,  please offer to do virtually or by telephone.  No hx of AWV eligible for AWVI per [employer] as of 06/27/2007  Please schedule at any time with [facility] -Nurse Health Advisor.      45 minute appointment   Any questions, please call me at [phone]

## 2022-10-28 ENCOUNTER — Other Ambulatory Visit: Payer: Self-pay | Admitting: Internal Medicine

## 2022-10-28 DIAGNOSIS — E782 Mixed hyperlipidemia: Secondary | ICD-10-CM

## 2022-10-28 NOTE — Telephone Encounter (Signed)
Requested Prescriptions  Pending Prescriptions Disp Refills   simvastatin (ZOCOR) 20 MG tablet [Pharmacy Med Name: SIMVASTATIN 20 MG TABLET] 90 tablet 0    Sig: TAKE 1 TABLET BY MOUTH EVERY DAY     Cardiovascular:  Antilipid - Statins Failed - 10/28/2022  2:30 AM      Failed - Lipid Panel in normal range within the last 12 months    Cholesterol, Total  Date Value Ref Range Status  01/15/2022 140 100 - 199 mg/dL Final   LDL Chol Calc (NIH)  Date Value Ref Range Status  01/15/2022 58 0 - 99 mg/dL Final   HDL  Date Value Ref Range Status  01/15/2022 47 >39 mg/dL Final   Triglycerides  Date Value Ref Range Status  01/15/2022 217 (H) 0 - 149 mg/dL Final         Passed - Patient is not pregnant      Passed - Valid encounter within last 12 months    Recent Outpatient Visits           Yesterday Essential hypertension   [place] [facility], Jesse Sans, MD   5 months ago Essential hypertension   [place] [facility] at [facility], Jesse Sans, MD   9 months ago Benign hypertension   [place] [facility] at [facility], Jesse Sans, MD

## 2022-10-29 DIAGNOSIS — E278 Other specified disorders of adrenal gland: Secondary | ICD-10-CM | POA: Diagnosis not present

## 2022-11-02 NOTE — ED Provider Notes (Signed)
[facility] Provider Note    Event Date/Time   First MD Initiated Contact with Patient 10/14/22 0158     (approximate)   History   Abdominal Pain   HPI  Candice Cook is a 81 y.o. female with history of diabetes, hypertension who presents to the emergency department with 2 months of chronic diffuse abdominal pain.  No fevers, vomiting, diarrhea, bloody stool, melena, urinary symptoms.   History provided by patient.    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   Wears dentures    full upper and lower    Past Surgical History:  Procedure Laterality Date   ABDOMINAL HYSTERECTOMY     APPENDECTOMY  1993   BROW LIFT Bilateral 02/27/2019   Procedure: BLEPHAROPLASTY UPPER EYELID W/EXCESS SKIN;  Surgeon: Karle Starch, MD;  Location: [place];  Service: Ophthalmology;  Laterality: Bilateral;   BROW PTOSIS Bilateral 02/27/2019   Procedure: BROW PTOSIS REPAIR;  Surgeon: Karle Starch, MD;  Location: [place];  Service: Ophthalmology;  Laterality: Bilateral;  Diabetic - oral meds   CATARACT EXTRACTION W/PHACO Left 03/22/2017   Procedure: CATARACT EXTRACTION PHACO AND INTRAOCULAR LENS PLACEMENT ([place]) left diabetic;  Surgeon: Eulogio Bear, MD;  Location: [place];  Service: Ophthalmology;  Laterality: Left;  Diabetic   CATARACT EXTRACTION W/PHACO Right 05/17/2017   Procedure: CATARACT EXTRACTION PHACO AND INTRAOCULAR LENS PLACEMENT ([place]) Right diabetic;  Surgeon: Eulogio Bear, MD;  Location: [place];  Service: Ophthalmology;  Laterality: Right;   CHOLECYSTECTOMY     COLONOSCOPY WITH PROPOFOL N/A 11/14/2017   Procedure: COLONOSCOPY WITH PROPOFOL;  Surgeon: Lollie Sails, MD;  [facility];  Service: Endoscopy;  Laterality: N/A;   EYE SURGERY     cataracts with extraocular prosthesis   KIDNEY SURGERY     gave daughter a kidney   NEPHRECTOMY Left 1993   donated to her daughter    MEDICATIONS:  Prior to Admission medications   Medication Sig Start Date End Date Taking? Authorizing Provider  docusate sodium (COLACE) 100 MG capsule Take 1 capsule (100 mg total) by mouth 2 (two) times daily. Patient not taking: Reported on 10/27/2022 10/14/22 10/14/23 Yes Aaron Bostwick, Cyril Mourning N, DO  ondansetron (ZOFRAN-ODT) 4 MG disintegrating tablet Take 1 tablet (4 mg total) by mouth every 6 (six) hours as needed for nausea or vomiting. 10/14/22  Yes Azjah Pardo, Delice Bison, DO  allopurinol (ZYLOPRIM) 100 MG tablet Take 2 tablets (200 mg total) by mouth daily. 01/15/22   Glean Hess, MD  aspirin EC 81 MG tablet Take 81 mg by mouth daily.    [provider]  famotidine (PEPCID) 20 MG tablet Take 1 tablet (20 mg total) by mouth daily. 07/24/22 07/24/23  Nance Pear, MD  gabapentin (NEURONTIN) 300 MG capsule Take 900 mg by mouth at bedtime. [facility] 05/08/22   [provider]  lisinopril-hydrochlorothiazide (ZESTORETIC) 20-12.5  MG tablet TAKE 1 TABLET BY MOUTH EVERY DAY 08/03/22   Glean Hess, MD  omeprazole (PRILOSEC) 40 MG capsule Take 1 capsule (40 mg total) by mouth daily. 01/15/22   Glean Hess, MD  sertraline (ZOLOFT) 50 MG tablet Take by mouth. Patient not taking: Reported on 10/27/2022 04/13/22   [provider]  simvastatin (ZOCOR) 20 MG tablet TAKE 1 TABLET BY MOUTH EVERY DAY 10/28/22   Glean Hess, MD  Skin Protectants, Misc. (EUCERIN) cream Apply topically 2 (two) times daily as needed (cheeks, forehead, chin where it is  red/feels dry). Thin smear 1-2 times daily to affected areas.  Do not use any other face creams or soaps until redness improves (anticipate in next 2 weeks) 08/03/22   Wynona Luna, MD  traZODone (DESYREL) 50 MG tablet TAKE 2 TABLETS BY MOUTH AT BEDTIME. Patient not taking: Reported on 10/27/2022 05/06/22   Glean Hess, MD    Physical Exam   Triage Vital Signs: ED Triage Vitals  Enc Vitals Group     BP 10/13/22 2005 (!) 177/68     Pulse Rate 10/13/22 2005 67     Resp 10/13/22 2005 16     Temp 10/13/22 2005 98.1 F (36.7 C)     Temp Source 10/13/22 2005 Oral     SpO2 10/13/22 2005 99 %     Weight 10/13/22 2006 98 lb (44.5 kg)     Height 10/13/22 2006 5' (1.524 m)     Head Circumference --      Peak Flow --      Pain Score 10/14/22 0251 0     Pain Loc --      Pain Edu? --      Excl. in [place]? --     Most recent vital signs: Vitals:   10/13/22 2346 10/14/22 0245  BP: (!) 168/61 (!) 155/60  Pulse: (!) 58 62  Resp: 16 16  Temp: 98.2 F (36.8 C) 98 F (36.7 C)  SpO2: 100% 100%    CONSTITUTIONAL: Alert and oriented and responds appropriately to questions. Well-appearing; well-nourished HEAD: Normocephalic, atraumatic EYES: Conjunctivae clear, pupils appear equal, sclera nonicteric ENT: normal nose; moist mucous membranes NECK: Supple, normal ROM CARD: RRR; S1 and S2 appreciated; no murmurs, no clicks, no rubs, no gallops RESP: Normal chest excursion without splinting or tachypnea; breath sounds clear and equal bilaterally; no wheezes, no rhonchi, no rales, no hypoxia or respiratory distress, speaking full sentences ABD/GI: Normal bowel sounds; non-distended; soft, non-tender, no rebound, no guarding, no peritoneal signs BACK: The back appears normal EXT: Normal ROM in all joints; no deformity noted, no edema; no cyanosis SKIN: Normal color for age and race; warm; no rash on exposed skin NEURO: Moves all extremities equally, normal speech PSYCH: The patient's mood  and manner are appropriate.   ED Results / Procedures / Treatments   LABS: (all labs ordered are listed, but only abnormal results are displayed) Labs Reviewed  COMPREHENSIVE METABOLIC PANEL - Abnormal; Notable for the following components:      Result Value   Sodium 134 (*)    Glucose, Bld 101 (*)    Total Protein 6.1 (*)    All other components within normal limits  LIPASE, BLOOD - Abnormal; Notable for the following components:   Lipase 58 (*)    All other components within normal limits  CBC WITH DIFFERENTIAL/PLATELET - Abnormal; Notable for the following components:   WBC 15.9 (*)    Lymphs Abs  10.2 (*)    All other components within normal limits  URINALYSIS, ROUTINE W REFLEX MICROSCOPIC - Abnormal; Notable for the following components:   Color, Urine YELLOW (*)    APPearance CLEAR (*)    Specific Gravity, Urine 1.044 (*)    Leukocytes,Ua TRACE (*)    All other components within normal limits  PATHOLOGIST SMEAR REVIEW     EKG:  EKG Interpretation  Date/Time:  Wednesday October 13 2022 20:24:38 EDT Ventricular Rate:  74 PR Interval:  152 QRS Duration: 76 QT Interval:  400 QTC Calculation: 444 R Axis:   55 Text Interpretation: Sinus rhythm with Premature supraventricular complexes Cannot rule out Anterior infarct , age undetermined Abnormal ECG When compared with ECG of 24-Jul-2022 15:49, PREVIOUS ECG IS PRESENT Confirmed by UNCONFIRMED, DOCTOR ([id number]), editor Dwaine Deter ([id number]) on 10/14/2022 12:26:15 PM         RADIOLOGY: My personal review and interpretation of imaging: CT abdomen pelvis shows no acute abnormality.  I have personally reviewed all radiology reports.   No results found.   PROCEDURES:  Critical Care performed: No    Procedures    IMPRESSION / MDM / ASSESSMENT AND PLAN / ED COURSE  I reviewed the triage vital signs and the nursing notes.    Patient here with chronic abdominal pain.  The patient is on the cardiac monitor to  evaluate for evidence of arrhythmia and/or significant heart rate changes.   DIFFERENTIAL DIAGNOSIS (includes but not limited to):   Chronic abdominal pain, IBS, gastritis, GERD, doubt appendicitis, cholecystitis, pancreatitis, colitis, bowel obstruction based on benign exam and length of symptoms   Patient's presentation is most consistent with exacerbation of chronic illness.   PLAN: Work-up initiated from triage.  Patient does have a slight leukocytosis but is afebrile.  Normal electrolytes and liver function test.  Urine does not appear infected.  Lipase minimally elevated but this appears to be her baseline.  CT of the abdomen pelvis obtained from triage and reviewed/interpreted by myself and the radiologist and shows no acute abnormality.  Recommended close follow-up with her PCP and gastroenterology.  She states she is feeling well at this time.  Abdominal exam is benign.  I do not feel further emergent work-up is indicated.  Will discharge with Zofran, Bentyl.   MEDICATIONS GIVEN IN ED: Medications  iohexol (OMNIPAQUE) 300 MG/ML solution 75 mL (75 mLs Intravenous Contrast Given 10/13/22 2038)     ED COURSE:  At this time, I do not feel there is any life-threatening condition present. I reviewed all nursing notes, vitals, pertinent previous records.  All lab and urine results, EKGs, imaging ordered have been independently reviewed and interpreted by myself.  I reviewed all available radiology reports from any imaging ordered this visit.  Based on my assessment, I feel the patient is safe to be discharged home without further emergent workup and can continue workup as an outpatient as needed. Discussed all findings, treatment plan as well as usual and customary return precautions.  They verbalize understanding and are comfortable with this plan.  Outpatient follow-up has been provided as needed.  All questions have been answered.    CONSULTS:  none   OUTSIDE RECORDS REVIEWED: Reviewed  patient's recent PCP notes.       FINAL CLINICAL IMPRESSION(S) / ED DIAGNOSES   Final diagnoses:  Chronic abdominal pain     Rx / DC Orders   ED Discharge Orders          Ordered  ondansetron (ZOFRAN-ODT) 4 MG disintegrating tablet  Every 6 hours PRN        10/14/22 0223    docusate sodium (COLACE) 100 MG capsule  2 times daily        10/14/22 0223    dicyclomine (BENTYL) 10 MG capsule  Every 8 hours PRN,   Status:  Discontinued        10/14/22 0224             Note:  This document was prepared using Dragon voice recognition software and may include unintentional dictation errors.   Vieva Brummitt, Delice Bison, DO 11/02/22 [id number]

## 2022-11-05 ENCOUNTER — Ambulatory Visit (INDEPENDENT_AMBULATORY_CARE_PROVIDER_SITE_OTHER): Payer: Medicare HMO

## 2022-11-05 ENCOUNTER — Ambulatory Visit
Admission: RE | Admit: 2022-11-05 | Discharge: 2022-11-05 | Disposition: A | Discharge status: Home / Self Care | Payer: Medicare HMO | Source: Ambulatory Visit | Attending: Physician Assistant | Admitting: Physician Assistant

## 2022-11-05 VITALS — BP 107/93 | HR 91 | Temp 98.5°F | Resp 18 | Ht 60.0 in | Wt 90.0 lb

## 2022-11-05 DIAGNOSIS — R0781 Pleurodynia: Secondary | ICD-10-CM

## 2022-11-05 DIAGNOSIS — S0990XA Unspecified injury of head, initial encounter: Secondary | ICD-10-CM

## 2022-11-05 DIAGNOSIS — M545 Low back pain, unspecified: Secondary | ICD-10-CM

## 2022-11-05 DIAGNOSIS — R55 Syncope and collapse: Secondary | ICD-10-CM | POA: Diagnosis not present

## 2022-11-05 DIAGNOSIS — Z043 Encounter for examination and observation following other accident: Secondary | ICD-10-CM | POA: Diagnosis not present

## 2022-11-05 DIAGNOSIS — M47816 Spondylosis without myelopathy or radiculopathy, lumbar region: Secondary | ICD-10-CM | POA: Diagnosis not present

## 2022-11-05 DIAGNOSIS — E860 Dehydration: Secondary | ICD-10-CM | POA: Diagnosis not present

## 2022-11-05 DIAGNOSIS — W19XXXA Unspecified fall, initial encounter: Secondary | ICD-10-CM | POA: Diagnosis not present

## 2022-11-05 DIAGNOSIS — R079 Chest pain, unspecified: Secondary | ICD-10-CM | POA: Diagnosis not present

## 2022-11-05 LAB — CBC WITH DIFFERENTIAL/PLATELET
Abs Immature Granulocytes: 0.03 10*3/uL (ref 0.00–0.07)
Basophils Absolute: 0.1 10*3/uL (ref 0.0–0.1)
Basophils Relative: 1 %
Eosinophils Absolute: 0.1 10*3/uL (ref 0.0–0.5)
Eosinophils Relative: 1 %
HCT: 38.6 % (ref 36.0–46.0)
Hemoglobin: 13.2 g/dL (ref 12.0–15.0)
Immature Granulocytes: 0 %
Lymphocytes Relative: 50 %
Lymphs Abs: 5.1 10*3/uL — ABNORMAL HIGH (ref 0.7–4.0)
MCH: 30.4 pg (ref 26.0–34.0)
MCHC: 34.2 g/dL (ref 30.0–36.0)
MCV: 88.9 fL (ref 80.0–100.0)
Monocytes Absolute: 0.4 10*3/uL (ref 0.1–1.0)
Monocytes Relative: 4 %
Neutro Abs: 4.5 10*3/uL (ref 1.7–7.7)
Neutrophils Relative %: 44 %
Platelets: 153 10*3/uL (ref 150–400)
RBC: 4.34 MIL/uL (ref 3.87–5.11)
RDW: 13.9 % (ref 11.5–15.5)
WBC: 10.2 10*3/uL (ref 4.0–10.5)
nRBC: 0 % (ref 0.0–0.2)

## 2022-11-05 LAB — URINALYSIS, ROUTINE W REFLEX MICROSCOPIC
Glucose, UA: NEGATIVE mg/dL
Hgb urine dipstick: NEGATIVE
Nitrite: NEGATIVE
Protein, ur: 30 mg/dL — AB
Specific Gravity, Urine: 1.02 (ref 1.005–1.030)
pH: 5.5 (ref 5.0–8.0)

## 2022-11-05 LAB — COMPREHENSIVE METABOLIC PANEL
ALT: 13 U/L (ref 0–44)
AST: 15 U/L (ref 15–41)
Albumin: 3.6 g/dL (ref 3.5–5.0)
Alkaline Phosphatase: 52 U/L (ref 38–126)
Anion gap: 7 (ref 5–15)
BUN: 28 mg/dL — ABNORMAL HIGH (ref 8–23)
CO2: 28 mmol/L (ref 22–32)
Calcium: 8.9 mg/dL (ref 8.9–10.3)
Chloride: 100 mmol/L (ref 98–111)
Creatinine, Ser: 1.05 mg/dL — ABNORMAL HIGH (ref 0.44–1.00)
GFR, Estimated: 54 mL/min — ABNORMAL LOW (ref >60)
Glucose, Bld: 132 mg/dL — ABNORMAL HIGH (ref 70–99)
Potassium: 3.2 mmol/L — ABNORMAL LOW (ref 3.5–5.1)
Sodium: 135 mmol/L (ref 135–145)
Total Bilirubin: 0.9 mg/dL (ref 0.3–1.2)
Total Protein: 6.3 g/dL — ABNORMAL LOW (ref 6.5–8.1)

## 2022-11-05 LAB — URINALYSIS, MICROSCOPIC (REFLEX)

## 2022-11-05 NOTE — ED Notes (Signed)
Called [employer] to start a prior authorization for CT Head WO Contrast 70450. Prior Authorization was approved with authorization number [id number]

## 2022-11-05 NOTE — ED Provider Notes (Addendum)
[facility]    CSN: [id number] Arrival date & time: 11/05/22  1053      History   Chief Complaint Chief Complaint  Patient presents with   Fall   Nausea   Cough    HPI Candice Cook is a 81 y.o. female presenting with her grandson for evaluation after a fall that happened 2 days ago.  He did not witness the fall and the patient does not member exactly what happened.  She says she can remember getting up to go to her TV to change a button and then states that she suddenly fell.  She does not know if she lost her balance or something else caused her to fall.  She says she pulled off a box from the dresser which hit her.  She reports hitting the back of her head and her ribs and back.  Unsure if she had a loss of consciousness or not.  She does have an area of swelling and bruising of the occipital region.  She is also reporting right-sided rib pain that is worse when she touches the area or takes of breath.  Also increased right lower back pain.  She is denying any headaches, dizziness.  She states that she does feel more fatigued than normal.  Denies any confusion.  Grandson says gait is a little off balance but she does not seem confused.  She denies any numbness or tingling.  Has not had any vision changes, chest pain, palpitations or shortness of breath.  She does have a history of chronic abdominal pain and has been seen for it in the emergency department and is followed by her PCP for this which she saw about a week and a half ago.  She does still report some upper abdominal pain and nausea/vomiting.  This does not seem any worse than normal.  Additionally, they do report that she has been coughing and congested for the past 2 weeks and so has her grandson.  She has not had any fevers.  No other injuries or complaints.  Medical history significant for anxiety depression, diabetes, hypertension, hyperlipidemia, type 2 diabetes and thyroid disease.  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   Wears dentures    full upper and lower    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   Pessary maintenance 01/11/2018   Vaginal polyp 12/12/2017   Vaginal atrophy 12/12/2017   Cystocele, midline 12/12/2017   Chronic diarrhea 09/15/2017   Chronic renal impairment, 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   Alzheimer's dementia without behavioral disturbance ([place]) 09/30/2016   Mixed incontinence 03/23/2016   B12 deficiency 02/03/2016  High risk medication use 01/15/2016   Primary insomnia 01/15/2016   Vitamin D deficiency, unspecified 01/15/2016   Microalbuminuria 09/23/2015   GERD without esophagitis 05/02/2015   History of cervical cancer 05/02/2015   History of hypothyroidism 05/02/2015   Urge incontinence 05/02/2015   Prediabetes 09/20/2014   Constipation by delayed colonic transit 08/30/2013   Rectocele 08/30/2013   Essential hypertension 07/02/2013    Past Surgical History:  Procedure Laterality Date   ABDOMINAL HYSTERECTOMY     APPENDECTOMY  1993   BROW LIFT Bilateral 02/27/2019   Procedure: BLEPHAROPLASTY UPPER EYELID W/EXCESS SKIN;  Surgeon: Karle Starch, MD;  Location: [place];  Service: Ophthalmology;  Laterality:  Bilateral;   BROW PTOSIS Bilateral 02/27/2019   Procedure: BROW PTOSIS REPAIR;  Surgeon: Karle Starch, MD;  Location: [place];  Service: Ophthalmology;  Laterality: Bilateral;  Diabetic - oral meds   CATARACT EXTRACTION W/PHACO Left 03/22/2017   Procedure: CATARACT EXTRACTION PHACO AND INTRAOCULAR LENS PLACEMENT ([place]) left diabetic;  Surgeon: Eulogio Bear, MD;  Location: [place];  Service: Ophthalmology;  Laterality: Left;  Diabetic   CATARACT EXTRACTION W/PHACO Right 05/17/2017   Procedure: CATARACT EXTRACTION PHACO AND INTRAOCULAR LENS PLACEMENT ([place]) Right diabetic;  Surgeon: Eulogio Bear, MD;  Location: [place];  Service: Ophthalmology;  Laterality: Right;   CHOLECYSTECTOMY     COLONOSCOPY WITH PROPOFOL N/A 11/14/2017   Procedure: COLONOSCOPY WITH PROPOFOL;  Surgeon: Lollie Sails, MD;  Location[facility] ENDOSCOPY;  Service: Endoscopy;  Laterality: N/A;   EYE SURGERY     cataracts with extraocular prosthesis   KIDNEY SURGERY     gave daughter a kidney   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 Authorizing Provider  allopurinol (ZYLOPRIM) 100 MG tablet Take 2 tablets (200 mg total) by mouth daily. 01/15/22  Yes Glean Hess, MD  aspirin EC 81 MG tablet Take 81 mg by mouth daily.   Yes [provider]  docusate sodium (COLACE) 100 MG capsule Take 1 capsule (100 mg total) by mouth 2 (two) times daily. 10/14/22 10/14/23 Yes Ward, Kristen N, DO  famotidine (PEPCID) 20 MG tablet Take 1 tablet (20 mg total) by mouth daily. 07/24/22 07/24/23 Yes Nance Pear, MD  gabapentin (NEURONTIN) 300 MG capsule Take 900 mg by mouth at bedtime. Dr[facility] 05/08/22  Yes [provider]  lisinopril-hydrochlorothiazide (ZESTORETIC) 20-12.5 MG tablet TAKE 1 TABLET BY MOUTH EVERY DAY 08/03/22  Yes Glean Hess, MD  omeprazole (PRILOSEC) 40 MG capsule Take 1 capsule (40 mg total) by mouth daily. 01/15/22  Yes Glean Hess, MD  ondansetron (ZOFRAN-ODT) 4 MG disintegrating tablet Take 1 tablet (4 mg total) by mouth every 6 (six) hours as needed for nausea or vomiting. 10/14/22  Yes Ward, Cyril Mourning N, DO  simvastatin (ZOCOR) 20 MG tablet TAKE 1 TABLET BY MOUTH EVERY DAY 10/28/22  Yes Glean Hess, MD  Skin Protectants, Misc. (EUCERIN) cream Apply topically 2 (two) times daily as needed (cheeks, forehead, chin where it is red/feels dry). Thin smear 1-2 times daily to affected areas.  Do not use any other face creams or soaps until redness improves (anticipate in next 2 weeks) 08/03/22  Yes Wynona Luna, MD  sertraline (ZOLOFT) 50 MG tablet Take by mouth. Patient not taking: Reported on 10/27/2022 04/13/22   [provider]  traZODone (DESYREL) 50 MG tablet TAKE 2 TABLETS BY MOUTH AT BEDTIME. Patient not taking: Reported on 10/27/2022 05/06/22   Glean Hess, MD    Family History Family History  Problem Relation Age of Onset   Heart disease Mother    Heart disease Father    Diabetes Father    Hypertension Father    Cancer Brother    Kidney disease Daughter    Bladder Cancer Neg Hx    Kidney cancer Neg Hx    Breast cancer Neg Hx     Social History Social History   Tobacco Use   Smoking status: Former   Smokeless tobacco: Never   Tobacco comments:    quit 25 years  Vaping Use   Vaping Use: Never used  Substance Use Topics   Alcohol use: No    Alcohol/week: 0.0 standard drinks of alcohol   Drug use: No     Allergies   Mirabegron and Metformin and related   Review of Systems Review of Systems  Constitutional:  Negative for fatigue.  HENT:  Negative for congestion.   Eyes:  Negative  for visual disturbance.  Respiratory:  Positive for cough. Negative for shortness of breath and wheezing.   Cardiovascular:  Negative for chest pain, palpitations and leg swelling.  Gastrointestinal:  Positive for abdominal pain and nausea. Negative for vomiting.  Musculoskeletal:  Positive for back pain and gait problem.  Skin:  Negative for color change and wound.  Neurological:  Positive for syncope (unsure). Negative for dizziness, weakness, numbness and headaches.  Psychiatric/Behavioral:  Negative for confusion.      Physical Exam Triage Vital Signs ED Triage Vitals  Enc Vitals Group     BP 11/05/22 1117 (!) 107/93     Pulse Rate 11/05/22 1117 91     Resp 11/05/22 1117 18     Temp 11/05/22 1117 98.5 F (36.9 C)     Temp Source 11/05/22 1117 Oral     SpO2 11/05/22 1117 97 %     Weight 11/05/22 1115 90 lb (40.8 kg)     Height 11/05/22 1115 5' (1.524 m)     Head Circumference --      Peak Flow --      Pain Score 11/05/22 1115 10     Pain Loc --      Pain Edu? --      Excl. in [place]? --    No data found.  Updated Vital Signs BP (!) 107/93 (BP Location: Left Arm)   Pulse 91   Temp 98.5 F (36.9 C) (Oral)   Resp 18   Ht 5' (1.524 m)   Wt 90 lb (40.8 kg)   SpO2 97%   BMI 17.58 kg/m   Physical Exam Vitals and nursing note reviewed.  Constitutional:      General: She is not in acute distress.    Appearance: Normal appearance. She is not ill-appearing or toxic-appearing.  HENT:     Head:     Comments: There is  an area of swelling and yellowish contusion of occiput. TTP.     Nose: Nose normal.     Mouth/Throat:     Mouth: Mucous membranes are moist.     Pharynx: Oropharynx is clear.  Eyes:     General: No scleral icterus.       Right eye: No discharge.        Left eye: No discharge.     Extraocular Movements: Extraocular movements intact.     Conjunctiva/sclera: Conjunctivae normal.     Pupils: Pupils are equal, round, and reactive to light.  Cardiovascular:      Rate and Rhythm: Normal rate and regular rhythm.     Heart sounds: Normal heart sounds.  Pulmonary:     Effort: Pulmonary effort is normal. No respiratory distress.     Breath sounds: Normal breath sounds.  Chest:     Chest wall: Tenderness (TTP diffusely right lateral ribs and right paralumbar muscles) present.  Abdominal:     Palpations: Abdomen is soft.     Tenderness: There is abdominal tenderness (RUQ, epigastric.).  Musculoskeletal:     Cervical back: Neck supple.  Skin:    General: Skin is dry.  Neurological:     General: No focal deficit present.     Mental Status: She is alert and oriented to person, place, and time. Mental status is at baseline.     Cranial Nerves: No cranial nerve deficit.     Motor: No weakness.     Gait: Gait normal.     Comments: 5 out of 5 strength bilateral upper and lower extremities.  Normal nose to finger testing.  Negative pronator drift.  Psychiatric:        Mood and Affect: Mood normal.        Behavior: Behavior normal.        Thought Content: Thought content normal.      UC Treatments / Results  Labs (all labs ordered are listed, but only abnormal results are displayed) Labs Reviewed  CBC WITH DIFFERENTIAL/PLATELET - Abnormal; Notable for the following components:      Result Value   Lymphs Abs 5.1 (*)    All other components within normal limits  COMPREHENSIVE METABOLIC PANEL - Abnormal; Notable for the following components:   Potassium 3.2 (*)    Glucose, Bld 132 (*)    BUN 28 (*)    Creatinine, Ser 1.05 (*)    Total Protein 6.3 (*)    GFR, Estimated 54 (*)    All other components within normal limits  URINALYSIS, ROUTINE W REFLEX MICROSCOPIC - Abnormal; Notable for the following components:   APPearance HAZY (*)    Bilirubin Urine SMALL (*)    Ketones, ur TRACE (*)    Protein, ur 30 (*)    Leukocytes,Ua TRACE (*)    All other components within normal limits  URINALYSIS, MICROSCOPIC (REFLEX) - Abnormal; Notable for the  following components:   Bacteria, UA FEW (*)    Non Squamous Epithelial PRESENT (*)    All other components within normal limits  URINE CULTURE    EKG   Radiology CT Head Wo Contrast  Result Date: 11/05/2022 CLINICAL DATA:  Head trauma, minor (Age >= 65y) EXAM: CT HEAD WITHOUT CONTRAST TECHNIQUE: Contiguous axial images were obtained from the base of the skull through the vertex without intravenous contrast. RADIATION DOSE REDUCTION: This exam was performed according to the departmental dose-optimization program which includes automated exposure control, adjustment of the mA and/or  kV according to patient size and/or use of iterative reconstruction technique. COMPARISON:  MR head March 11, 2020 FINDINGS: Brain: No evidence of acute infarction, hemorrhage, hydrocephalus, extra-axial collection or mass lesion/mass effect. Scattered white matter hypodensities, better evaluated on prior MRI. Vascular: No hyperdense vessel identified. Skull: No acute fracture identified. Sinuses/Orbits: Largely clear sinuses.  No acute orbital findings. Other: No mastoid effusions. IMPRESSION: No evidence of acute intracranial abnormality. Electronically Signed   By: Margaretha Sheffield M.D.   On: 11/05/2022 14:36   DG Ribs Unilateral W/Chest Right  Result Date: 11/05/2022 CLINICAL DATA:  Fall EXAM: RIGHT RIBS AND CHEST - 3+ VIEW COMPARISON:  09/16/2020 FINDINGS: No displaced fracture or other bone lesions are seen involving the ribs. There is no evidence of pneumothorax or pleural effusion. Both lungs are clear. Heart size and mediastinal contours are within normal limits. IMPRESSION: No displaced fracture or other radiographic findings to explain pain. Electronically Signed   By: Delanna Ahmadi M.D.   On: 11/05/2022 12:02   DG Lumbar Spine Complete  Result Date: 11/05/2022 CLINICAL DATA:  Status post fall. EXAM: LUMBAR SPINE - COMPLETE 4+ VIEW COMPARISON:  August 07, 2013 FINDINGS: There is no evidence of lumbar  spine fracture. Mild levoconvex scoliosis. Multilevel osteoarthritic changes, worse at L2-L3. Posterior facet arthropathy. Heavy calcific atherosclerotic disease of the aorta. IMPRESSION: 1. No acute fracture or dislocation identified about the lumbosacral spine. 2. Multilevel osteoarthritic changes, worse at L2-L3. Electronically Signed   By: Fidela Salisbury M.D.   On: 11/05/2022 12:00     Abd CT 10/13/22 IMPRESSION: 1. No CT evidence for acute intra-abdominal or pelvic abnormality. 2. Status post left nephrectomy. Possible solid enhancing mass in the upper right kidney. When the patient is clinically stable and able to follow directions and hold their breath (preferably as an outpatient) further evaluation with dedicated abdominal MRI should be considered. 3. 10 mm right adrenal nodule with density value of 96. This probably represents an adenoma, 1 year follow-up adrenal washout CT is recommended, if stable greater than or equal to 1 year, no further imaging follow-up is recommended 4. 15 mm left adrenal nodule with density value of 152. This may represent possible pheochromocytoma, recommend biochemical lab evaluation, if laboratory values are normal, 1 year follow-up adrenal washout CT is recommended. If stable greater than or equal to 1 year, no further follow-up imaging is recommended.   Procedures ED EKG  Date/Time: 11/05/2022 2:50 PM  Performed by: Danton Clap, PA-C Authorized by: Danton Clap, PA-C   Previous ECG:    Previous ECG:  Compared to current   Similarity:  Changes noted   Comparison ECG info:  Bradycardia now Interpretation:    Interpretation: abnormal     Details:  Bradycardia Rhythm:    Rhythm: sinus bradycardia   Ectopy:    Ectopy: none   QRS:    QRS axis:  Normal   QRS intervals:  Normal   QRS conduction: normal   ST segments:    ST segments:  Normal T waves:    T waves: normal   Comments:     Sinus bradycardia  (including critical  care time)  Medications Ordered in UC Medications - No data to display  Initial Impression / Assessment and Plan / UC Course  I have reviewed the triage vital signs and the nursing notes.  Pertinent labs & imaging results that were available during my care of the patient were reviewed by me and considered in my medical decision  making (see chart for details).  Clinical Course as of 11/05/22 1504  Fri Nov 05, 2022  1205 DG Lumbar Spine Complete [LE]    Clinical Course User Index [LE] Danton Clap, PA-C   81 year old female presenting for injuries following a fall that occurred 2 days ago.  She does not remember the details of the fall other than she was going to her TV to change the channel and then fell.  Unsure of loss of consciousness.  No one witnessed the fall.  She does have a hematoma of the occipital region and pain along the right lateral ribs and right lower back.  X-ray of ribs and L-spine do not show any fractures or acute abnormalities.  We will proceed with further work-up to see if I can identify any potential cause for patient's presyncopal/syncopal episode/fall/head injury.  EKG performed today shows sinus bradycardia.  Urinalysis shows hazy urine with small bili, ketones and trace leukocytes.  We will send urine for culture to ensure no urinary tract infection.  Will wait for results before starting patient on antibiotics.  CBC essentially normal.  CMP does show slightly decreased potassium of 3.2 and elevated BUN of 28 as well as elevated creatinine 1.05.  This is up a little from patient's normal.  GFR is 54.  He is usually over 70.  CT head without contrast ordered.  Results reveal no acute intracranial abnormality.  Discussed results with the patient and her grandson.  Suspect patient is mildly dehydrated.  She has also been ill since she has had cough and congestion for the past couple of weeks.  The chest x-ray does not show any evidence of pneumonia.  Suspect  that is a viral illness.  The the mild dehydration may have caused her to feel dizzy and falling.  Encouraged her to increase her fluid intake at home.  Her other findings are all very reassuring.  However, I have advised that she should follow-up with her PCP at next available appointment especially if she has any recurrence of symptoms.  She could have a mild concussion as well since she is having some fatigue since the fall.  Discussed care of head injury with patient and grandson.  Discussed use of Tylenol and ice for the back of her head and ribs.  Suspect bruised ribs but there are no fractures seen.  Possible lumbar strain to lower back.  Advised use of heat and Tylenol as above.  Encouraged patient to continue to follow-up with PCP regarding her chronic abdominal pain.  Thoroughly reviewed ED precautions with patient and her grandson.  She is feeling well at this time and vitals are stable.  She is leaving in stable condition.  Of note, patient is being worked up for possible pheochromocytoma and adrenal hypertrophy.  This is believed to be the cause of her chronic abdominal pain.  She has been referred to GI and is awaiting an appointment.  Follow-up with GI is on the 11/16/2022.  Final Clinical Impressions(s) / UC Diagnoses   Final diagnoses:  Injury of head, initial encounter  Rib pain on right side  Acute right-sided low back pain, unspecified whether sciatica present  Syncope and collapse  Dehydration     Discharge Instructions      -You do not have any fractures.  You may have bruised your ribs and pulled a muscle in your back.  I would advise taking Tylenol for pain and using ice, heat, muscle rubs.  This should get better gradually. -  Your CT of your head does not show any bleeding or acute abnormality.  You may have a mild concussion causing your fatigue. - Your lab work all looks pretty good except for the fact that you are a bit dehydrated and should increase your fluid  intake. -The x-ray does not show any evidence of pneumonia.  You are coughing is likely due to a viral infection.  This should get better but if you develop a fever or have worsening coughing or breathing you should be seen again right away. - Try to make a follow-up appointment with your PCP next week for reexamination.     ED Prescriptions   None    PDMP not reviewed this encounter.   Danton Clap, PA-C 11/05/22 1509    Laurene Footman B, PA-C 11/05/22 1511

## 2022-11-05 NOTE — Discharge Instructions (Addendum)
-  You do not have any fractures.  You may have bruised your ribs and pulled a muscle in your back.  I would advise taking Tylenol for pain and using ice, heat, muscle rubs.  This should get better gradually. - Your CT of your head does not show any bleeding or acute abnormality.  You may have a mild concussion causing your fatigue. - Your lab work all looks pretty good except for the fact that you are a bit dehydrated and should increase your fluid intake. -The x-ray does not show any evidence of pneumonia.  You are coughing is likely due to a viral infection.  This should get better but if you develop a fever or have worsening coughing or breathing you should be seen again right away. - Try to make a follow-up appointment with your PCP next week for reexamination.

## 2022-11-05 NOTE — ED Triage Notes (Signed)
Pt is with her grandson  Pt c/o fall 2 days ago.   Pt states that she does not remember falling and fall on her back. Pt states that she had vertigo and was unable to get up due to the room spinning.   Pt states that she has pain along the right side of her back and ribs. Pt also has a bump along the side of her head.    Pt c/o cough, nasuea x2weeks.

## 2022-11-06 LAB — URINE CULTURE: Culture: NO GROWTH

## 2022-11-08 LAB — METANEPHRINES, URINE, 24 HOUR
Metaneph Total, Ur: 187 ug/L
Metanephrines, 24H Ur: 98 ug/24 hr (ref 36–209)
Normetanephrine, 24H Ur: 224 ug/24 hr (ref 131–612)
Normetanephrine, Ur: 426 ug/L

## 2022-11-08 LAB — CATECHOLAMINE+VMA, 24-HR URINE
Dopamine , 24H Ur: 112 ug/24 hr (ref 0–510)
Dopamine, Rand Ur: 214 ug/L
Epinephrine, 24H Ur: 4 ug/24 hr (ref 0–20)
Epinephrine, Rand Ur: 7 ug/L
Norepinephrine, 24H Ur: 31 ug/24 hr (ref 0–135)
Norepinephrine, Rand Ur: 59 ug/L
VMA, 24H Ur Adult: 3.9 mg/24 hr (ref 0.0–7.5)
VMA, Urine: 7.5 mg/L

## 2022-11-16 ENCOUNTER — Ambulatory Visit: Payer: Medicare HMO | Admitting: Gastroenterology

## 2022-11-17 ENCOUNTER — Other Ambulatory Visit: Payer: Self-pay | Admitting: Internal Medicine

## 2022-11-17 DIAGNOSIS — K219 Gastro-esophageal reflux disease without esophagitis: Secondary | ICD-10-CM

## 2022-11-17 NOTE — Telephone Encounter (Signed)
Requested Prescriptions  Pending Prescriptions Disp Refills   omeprazole (PRILOSEC) 40 MG capsule [Pharmacy Med Name: OMEPRAZOLE DR 40 MG CAPSULE] 90 capsule 0    Sig: TAKE 1 CAPSULE (40 MG TOTAL) BY MOUTH DAILY.     Gastroenterology: Proton Pump Inhibitors Passed - 11/17/2022  3:13 PM      Passed - Valid encounter within last 12 months    Recent Outpatient Visits           3 weeks ago Essential hypertension   [place] [facility] at [facility], Jesse Sans, MD   6 months ago Essential hypertension   [place] [facility] at [facility], Jesse Sans, MD   10 months ago Benign hypertension   [place] [facility] at [facility], Jesse Sans, MD       Future Appointments             In 1 week Army Melia Jesse Sans, MD [facility] at [facility], [facility]   In 4 months Army Melia, Jesse Sans, MD [place] [facility] at [facility], [facility]

## 2022-11-29 ENCOUNTER — Ambulatory Visit (INDEPENDENT_AMBULATORY_CARE_PROVIDER_SITE_OTHER): Payer: Medicare HMO | Admitting: Internal Medicine

## 2022-11-29 ENCOUNTER — Encounter: Payer: Self-pay | Admitting: Internal Medicine

## 2022-11-29 VITALS — BP 122/58 | HR 74 | Ht 60.0 in | Wt 94.2 lb

## 2022-11-29 DIAGNOSIS — E782 Mixed hyperlipidemia: Secondary | ICD-10-CM | POA: Diagnosis not present

## 2022-11-29 DIAGNOSIS — M5136 Other intervertebral disc degeneration, lumbar region: Secondary | ICD-10-CM

## 2022-11-29 DIAGNOSIS — F5101 Primary insomnia: Secondary | ICD-10-CM | POA: Diagnosis not present

## 2022-11-29 DIAGNOSIS — E278 Other specified disorders of adrenal gland: Secondary | ICD-10-CM | POA: Diagnosis not present

## 2022-11-29 DIAGNOSIS — I10 Essential (primary) hypertension: Secondary | ICD-10-CM | POA: Diagnosis not present

## 2022-11-29 MED ORDER — TRAZODONE HCL 50 MG PO TABS: 50.0000 mg | ORAL_TABLET | Freq: Every evening | ORAL | 1 refills | Status: DC | PRN

## 2022-11-29 MED ORDER — GABAPENTIN 300 MG PO CAPS: 900.0000 mg | ORAL_CAPSULE | Freq: Every day | ORAL | 5 refills | Status: DC

## 2022-11-29 MED ORDER — SIMVASTATIN 20 MG PO TABS: 20.0000 mg | ORAL_TABLET | Freq: Every day | ORAL | 1 refills | Status: DC

## 2022-11-29 NOTE — Progress Notes (Signed)
Date:  11/29/2022   Name:  Candice Cook   DOB:  November 27, 1941   MRN:  [id number]   Chief Complaint: Hypertension  Hypertension This is a chronic problem. The problem is controlled. Past treatments include ACE inhibitors and diuretics.  Back Pain This is a chronic problem. The problem is unchanged. The pain is present in the lumbar spine. Risk factors: she did fall last month and bruised her right ribs.  No fx on Xray.  Feeling better. Treatments tried: was on gabapentin but ran out a few weeks ago and can tell the difference.  Depression        This is a chronic problem.The problem is unchanged.  Past treatments include other medications and SSRIs - Selective serotonin reuptake inhibitors (zoloft and trazodone).  Compliance with treatment is good. Abdominal pain - this has completely resolved.  Her pheo workup was negative.  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 However the possible mass of the right kidney is recommended to be imaged by MRI. Other adrenal masses can be follow up with a CT in one year.  Due to recent fall and rib fractures, will defer MRI to the next visit.  1. No CT evidence for acute intra-abdominal or pelvic abnormality. 2. Status post left nephrectomy. Possible solid enhancing mass in the upper right kidney. When the patient is clinically stable and able to follow directions and hold their breath (preferably as an outpatient) further evaluation with dedicated abdominal MRI should be considered. 3. 10 mm right adrenal nodule with density value of 96. This probably represents an adenoma, 1 year follow-up adrenal washout CT is recommended, if stable greater than or equal to 1 year, no further imaging follow-up is recommended 4. 15 mm left adrenal nodule with density value of 152. This may represent possible pheochromocytoma, recommend biochemical lab evaluation, if laboratory values are normal, 1 year follow-up adrenal washout CT is recommended. If stable  greater than or equal to 1 year, no further follow-up imaging is recommended.   Lab Results  Component Value Date   NA 135 11/05/2022   K 3.2 (L) 11/05/2022   CO2 28 11/05/2022   GLUCOSE 132 (H) 11/05/2022   BUN 28 (H) 11/05/2022   CREATININE 1.05 (H) 11/05/2022   CALCIUM 8.9 11/05/2022   EGFR 80 05/17/2022   GFRNONAA 54 (L) 11/05/2022   Lab Results  Component Value Date   CHOL 140 01/15/2022   HDL 47 01/15/2022   LDLCALC 58 01/15/2022   TRIG 217 (H) 01/15/2022   CHOLHDL 3.0 01/15/2022   No results found for: "TSH" Lab Results  Component Value Date   HGBA1C 5.8 (H) 05/17/2022   Lab Results  Component Value Date   WBC 10.2 11/05/2022   HGB 13.2 11/05/2022   HCT 38.6 11/05/2022   MCV 88.9 11/05/2022   PLT 153 11/05/2022   Lab Results  Component Value Date   ALT 13 11/05/2022   AST 15 11/05/2022   ALKPHOS 52 11/05/2022   BILITOT 0.9 11/05/2022   Lab Results  Component Value Date   VD25OH 44.7 05/17/2022     Review of Systems  Musculoskeletal:  Positive for back pain.  Psychiatric/Behavioral:  Positive for depression.     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   Pessary maintenance 01/11/2018   Vaginal polyp 12/12/2017   Vaginal atrophy 12/12/2017   Cystocele, midline 12/12/2017   Chronic diarrhea 09/15/2017   Chronic renal impairment, 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   Alzheimer's dementia without behavioral disturbance ([place]) 09/30/2016   Mixed incontinence 03/23/2016   B12 deficiency 02/03/2016   High risk medication use 01/15/2016   Primary insomnia 01/15/2016   Vitamin D deficiency, unspecified 01/15/2016   Microalbuminuria  09/23/2015   GERD without esophagitis 05/02/2015   History of cervical cancer 05/02/2015   History of hypothyroidism 05/02/2015   Urge incontinence 05/02/2015   Prediabetes 09/20/2014   Constipation by delayed colonic transit 08/30/2013   Rectocele 08/30/2013   Essential hypertension 07/02/2013    Allergies  Allergen Reactions   Mirabegron Other (See Comments)    Severe tremor, headache   Metformin And Related Diarrhea    Past Surgical History:  Procedure Laterality Date   ABDOMINAL HYSTERECTOMY     APPENDECTOMY  1993   BROW LIFT Bilateral 02/27/2019   Procedure: BLEPHAROPLASTY UPPER EYELID W/EXCESS SKIN;  Surgeon: Karle Starch, MD;  Location: [place];  Service: Ophthalmology;  Laterality: Bilateral;   BROW PTOSIS Bilateral 02/27/2019   Procedure: BROW PTOSIS REPAIR;  Surgeon: Karle Starch, MD;  Location: [place];  Service: Ophthalmology;  Laterality: Bilateral;  Diabetic - oral meds   CATARACT EXTRACTION W/PHACO Left 03/22/2017   Procedure: CATARACT EXTRACTION PHACO AND INTRAOCULAR LENS PLACEMENT ([place]) left diabetic;  Surgeon: Eulogio Bear, MD;  Location: [place];  Service: Ophthalmology;  Laterality: Left;  Diabetic   CATARACT EXTRACTION W/PHACO Right 05/17/2017   Procedure: CATARACT EXTRACTION PHACO AND INTRAOCULAR LENS PLACEMENT ([place]) Right diabetic;  Surgeon: Eulogio Bear, MD;  Location: [place];  Service: Ophthalmology;  Laterality: Right;   CHOLECYSTECTOMY     COLONOSCOPY WITH PROPOFOL N/A 11/14/2017   Procedure: COLONOSCOPY WITH PROPOFOL;  Surgeon: Lollie Sails, MD;  Location[facility];  Service: Endoscopy;  Laterality: N/A;   EYE SURGERY     cataracts with extraocular prosthesis   KIDNEY SURGERY     gave daughter a kidney   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  Current Meds  Medication Sig   allopurinol (ZYLOPRIM) 100 MG tablet Take 2 tablets (200 mg total) by mouth daily.   aspirin EC 81 MG tablet Take 81 mg by mouth daily.   docusate sodium (COLACE) 100 MG capsule Take 1 capsule (100 mg total) by mouth 2 (two) times daily.   gabapentin (NEURONTIN) 300 MG capsule Take 900 mg by mouth at bedtime. [facility]   lisinopril-hydrochlorothiazide (ZESTORETIC) 20-12.5 MG tablet TAKE 1 TABLET BY MOUTH EVERY DAY   omeprazole (PRILOSEC) 40 MG capsule TAKE 1 CAPSULE (40 MG TOTAL) BY MOUTH DAILY.   ondansetron (ZOFRAN-ODT) 4 MG disintegrating tablet Take 1 tablet (4 mg total) by mouth every 6 (six) hours as needed for nausea or vomiting.   sertraline (ZOLOFT) 50 MG  tablet Take by mouth.   simvastatin (ZOCOR) 20 MG tablet TAKE 1 TABLET BY MOUTH EVERY DAY   Skin Protectants, Misc. (EUCERIN) cream Apply topically 2 (two) times daily as needed (cheeks, forehead, chin where it is red/feels dry). Thin smear 1-2 times daily to affected areas.  Do not use any other face creams or soaps until redness improves (anticipate in next 2 weeks)   traZODone (DESYREL) 50 MG tablet TAKE 2 TABLETS BY MOUTH AT BEDTIME.       11/29/2022    9:50 AM 10/27/2022    1:33 PM 05/17/2022    8:16 AM 01/15/2022    2:53 PM  GAD 7 : Generalized Anxiety Score  Nervous, Anxious, on Edge _0 Control/stop worrying _1 Worry too much - different things _2 Trouble relaxing _3 Restless _4 Easily annoyed or irritable _5 Afraid - awful might happen 0 1 0 1  Total GAD 7 Score _6 Anxiety Difficulty Not difficult at all Very difficult         11/29/2022    9:50 AM 10/27/2022    1:32 PM 05/17/2022    8:15 AM  Depression screen PHQ 2/9   Decreased Interest _7 Down, Depressed, Hopeless _8 PHQ - 2 Score _9 Altered sleeping _10 Tired, decreased energy _11 Change in appetite _12 Feeling bad or failure about yourself  0 1 1  Trouble concentrating 1 0 1  Moving slowly or fidgety/restless 0 2 0  Suicidal thoughts 0 0 0  PHQ-9 Score _13 Difficult doing work/chores Not difficult at all Extremely dIfficult     BP Readings from Last 3 Encounters:  11/29/22 (!) 122/58  11/05/22 (!) 107/93  10/27/22 128/68    Physical Exam Vitals and nursing note reviewed.  Constitutional:      General: She is not in acute distress.    Appearance: Normal appearance. She is well-developed.  HENT:     Head: Normocephalic and atraumatic.  Cardiovascular:     Rate and Rhythm: Normal rate and regular rhythm.     Pulses: Normal pulses.  Pulmonary:     Effort: Pulmonary effort is normal. No respiratory distress.     Breath sounds: No wheezing or rhonchi.  Musculoskeletal:     Cervical back: Normal range of motion.     Right lower leg: No edema.     Left lower leg: No edema.  Skin:    General: Skin is warm and dry.     Capillary Refill: Capillary refill takes less than 2 seconds.     Findings: No rash.  Neurological:     General: No focal deficit present.     Mental Status: She is alert and oriented to person, place, and time.  Psychiatric:        Mood and Affect: Mood normal.        Behavior: Behavior normal.     Wt Readings from Last 3 Encounters:  11/29/22 94 lb 3.2 oz (42.7 kg)  11/05/22 90 lb (40.8 kg)  10/27/22 96 lb (43.5 kg)    BP (!) 122/58   Pulse 74   Ht 5' (1.524 m)   Wt 94 lb 3.2 oz (42.7 kg)   BMI 18.40 kg/m   Assessment and  Plan: 1. Essential hypertension Clinically stable exam with well controlled BP. Tolerating medications without side effects at this time. Pt to continue current regimen and low sodium diet; benefits of regular exercise as able discussed.  2. Lumbar  degenerative disc disease Resume gabapentin at bedtime - gabapentin (NEURONTIN) 300 MG capsule; Take 3 capsules (900 mg total) by mouth at bedtime. [facility]  Dispense: 90 capsule; Refill: 5  3. Primary insomnia Doing well on Trazodone at HS - traZODone (DESYREL) 50 MG tablet; Take 1 tablet (50 mg total) by mouth at bedtime as needed for sleep.  Dispense: 90 tablet; Refill: 1  4. Mixed hyperlipidemia - simvastatin (ZOCOR) 20 MG tablet; Take 1 tablet (20 mg total) by mouth daily.  Dispense: 90 tablet; Refill: 1  5. Adrenal hypertrophy (HCC) Pheo workup was negative.  Her abdominal pain and diarrhea have completely resolve. Will need to do MRI next visit for the right renal mass.   Partially dictated using Editor, commissioning. Any errors are unintentional.  Halina Maidens, MD [facility]  11/29/2022

## 2022-12-07 ENCOUNTER — Telehealth: Payer: Self-pay | Admitting: Internal Medicine

## 2022-12-07 NOTE — Telephone Encounter (Addendum)
Pt has been scheduled! OK per Juliann Pulse.

## 2022-12-07 NOTE — Telephone Encounter (Signed)
Copied from [address] [id number]. Topic: Medicare AWV >> Dec 07, 2022 11:01 AM Devoria Glassing wrote: Reason for CRM: Called patient to schedule Medicare Annual Wellness Visit (AWV) with [facility] Health Advisor.  Appointment can be an offiice/telephone or virtual visit;  Please call [phone] ask for [facility]. Patient mailbox full

## 2022-12-17 ENCOUNTER — Ambulatory Visit (INDEPENDENT_AMBULATORY_CARE_PROVIDER_SITE_OTHER): Payer: Medicare HMO

## 2022-12-17 NOTE — Progress Notes (Signed)
The pt did not answer the phone for her virtual telephone AWV appointment. Left a message on the patient voicemail.

## 2022-12-17 NOTE — Patient Instructions (Signed)

## 2022-12-19 ENCOUNTER — Other Ambulatory Visit: Payer: Self-pay | Admitting: Internal Medicine

## 2022-12-19 DIAGNOSIS — M109 Gout, unspecified: Secondary | ICD-10-CM

## 2022-12-21 ENCOUNTER — Other Ambulatory Visit: Payer: Self-pay | Admitting: Internal Medicine

## 2022-12-21 DIAGNOSIS — M5136 Other intervertebral disc degeneration, lumbar region: Secondary | ICD-10-CM

## 2022-12-21 DIAGNOSIS — M109 Gout, unspecified: Secondary | ICD-10-CM

## 2022-12-21 NOTE — Telephone Encounter (Unsigned)
Copied from [place] [id number]. Topic: General - Other >> Dec 21, 2022  3:17 PM Candice Cook wrote: Reason for CRM: Medication Refill - Medication: gabapentin (NEURONTIN) 300 MG capsule [[id number]]  allopurinol (ZYLOPRIM) 100 MG tablet [[id number]]  Has the patient contacted their pharmacy? Yes.   (Agent: If no, request that the patient contact the pharmacy for the refill. If patient does not wish to contact the pharmacy document the reason why and proceed with request.) (Agent: If yes, when and what did the pharmacy advise?)  Preferred Pharmacy (with phone number or street name): [id number] [id number]: [phone]: [phone]: Not open 24 hours   Has the patient been seen for an appointment in the last year OR does the patient have an upcoming appointment? Yes.    Agent: Please be advised that RX refills may take up to 3 business days. We ask that you follow-up with your pharmacy.

## 2022-12-23 NOTE — Telephone Encounter (Signed)
Medications have been refilled ?

## 2022-12-29 ENCOUNTER — Telehealth: Payer: Self-pay | Admitting: Internal Medicine

## 2022-12-29 NOTE — Telephone Encounter (Signed)
Copied from [place] [phone]. Topic: Medicare AWV >> Dec 29, 2022  1:11 PM Devoria Glassing wrote: Reason for CRM: Left message tor patient to schedule Medicare Annual Wellness Visit (AWV) with [facility] Health Advisor.  Appointment can be an offiice/telephone or virtual visit;  Please call [phone] ask for [facility].

## 2023-01-06 ENCOUNTER — Telehealth: Payer: Self-pay | Admitting: Internal Medicine

## 2023-01-06 NOTE — Telephone Encounter (Signed)
Copied from [place] [id number]. Topic: Medicare AWV >> Jan 06, 2023 11:03 AM Devoria Glassing wrote: Reason for CRM: Left message tor patient to schedule Medicare Annual Wellness Visit (AWV) with [facility] Health Advisor.  Appointment can be an offiice/telephone or virtual visit;  Please call [phone] ask for [facility]

## 2023-01-21 ENCOUNTER — Telehealth: Payer: Self-pay | Admitting: Internal Medicine

## 2023-01-21 NOTE — Telephone Encounter (Signed)
Copied from [place] [phone]. Topic: Medicare AWV >> Jan 21, 2023 10:32 AM Devoria Glassing wrote: Reason for CRM: Left message tor patient to schedule Medicare Annual Wellness Visit (AWV) with [facility] Health Advisor.  Appointment can be an offiice/telephone or virtual visit;  Please call [phone] ask for [facility].

## 2023-02-17 ENCOUNTER — Other Ambulatory Visit: Payer: Self-pay | Admitting: Internal Medicine

## 2023-02-17 DIAGNOSIS — K219 Gastro-esophageal reflux disease without esophagitis: Secondary | ICD-10-CM

## 2023-02-17 NOTE — Telephone Encounter (Signed)
Requested Prescriptions  Pending Prescriptions Disp Refills   omeprazole (PRILOSEC) 40 MG capsule [Pharmacy Med Name: OMEPRAZOLE DR 40 MG CAPSULE] 90 capsule 1    Sig: TAKE 1 CAPSULE (40 MG TOTAL) BY MOUTH DAILY.     Gastroenterology: Proton Pump Inhibitors Passed - 02/17/2023  2:15 AM      Passed - Valid encounter within last 12 months    Recent Outpatient Visits           2 months ago Essential hypertension   [place] at [facility], Jesse Sans, MD   3 months ago Essential hypertension   [facility], Jesse Sans, MD   9 months ago Essential hypertension   [facility] [facility], Jesse Sans, MD   1 year ago Benign hypertension    [place] at [facility], Jesse Sans, MD       Future Appointments             In 1 month [facility], Jesse Sans, MD [place] at [facility]

## 2023-02-23 ENCOUNTER — Telehealth: Payer: Self-pay | Admitting: Internal Medicine

## 2023-02-23 NOTE — Telephone Encounter (Signed)
Copied from [place] [phone]. Topic: Medicare AWV >> Feb 23, 2023 12:10 PM Devoria Glassing wrote: Reason for CRM: Called patient to schedule Medicare Annual Wellness Visit (AWV). No voicemail available to leave a message.  Last date of AWV: NONE  Please schedule an appointment at any time with Kirke Shaggy, NHA  .  If any questions, please contact me.  Thank you ,  Sherol Dade; [place] Direct Dial: [phone]

## 2023-02-23 NOTE — Telephone Encounter (Signed)
ERROR

## 2023-04-08 ENCOUNTER — Encounter: Payer: Self-pay | Admitting: Internal Medicine

## 2023-04-08 ENCOUNTER — Ambulatory Visit (INDEPENDENT_AMBULATORY_CARE_PROVIDER_SITE_OTHER): Payer: Medicare HMO | Admitting: Internal Medicine

## 2023-04-08 VITALS — BP 124/76 | HR 53 | Ht 60.0 in | Wt 94.0 lb

## 2023-04-08 DIAGNOSIS — N182 Chronic kidney disease, stage 2 (mild): Secondary | ICD-10-CM

## 2023-04-08 DIAGNOSIS — I10 Essential (primary) hypertension: Secondary | ICD-10-CM | POA: Diagnosis not present

## 2023-04-08 DIAGNOSIS — E782 Mixed hyperlipidemia: Secondary | ICD-10-CM

## 2023-04-08 DIAGNOSIS — R7303 Prediabetes: Secondary | ICD-10-CM

## 2023-04-08 DIAGNOSIS — M5136 Other intervertebral disc degeneration, lumbar region: Secondary | ICD-10-CM | POA: Diagnosis not present

## 2023-04-08 DIAGNOSIS — N2889 Other specified disorders of kidney and ureter: Secondary | ICD-10-CM | POA: Diagnosis not present

## 2023-04-08 MED ORDER — GABAPENTIN 300 MG PO CAPS: 900.0000 mg | ORAL_CAPSULE | Freq: Every day | ORAL | 5 refills | Status: DC

## 2023-04-08 MED ORDER — SIMVASTATIN 20 MG PO TABS: 20.0000 mg | ORAL_TABLET | Freq: Every day | ORAL | 1 refills | Status: DC

## 2023-04-08 MED ORDER — SERTRALINE HCL 50 MG PO TABS: 50.0000 mg | ORAL_TABLET | Freq: Every day | ORAL | 1 refills | Status: DC

## 2023-04-08 NOTE — Assessment & Plan Note (Signed)
Noted on CT in November Needs further imaging with MRI

## 2023-04-08 NOTE — Assessment & Plan Note (Signed)
Mild worsening without worrisome features Continue gabapentin

## 2023-04-08 NOTE — Assessment & Plan Note (Signed)
Clinically stable exam with well controlled BP on lisinopril hct. Tolerating medications without side effects. Pt to continue current regimen and low sodium diet.  

## 2023-04-08 NOTE — Progress Notes (Signed)
Date:  04/08/2023   Name:  Candice Cook   DOB:  25-Feb-1941   MRN:  [id number]   Chief Complaint: Hypertension and MRI kidney  Hypertension This is a chronic problem. Pertinent negatives include no chest pain, headaches, palpitations or shortness of breath. Past treatments include ACE inhibitors and diuretics. Hypertensive end-organ damage includes kidney disease. There is no history of CAD/MI or CVA.  Back Pain This is a chronic problem. The problem is unchanged. The pain is present in the lumbar spine. Pertinent negatives include no abdominal pain, chest pain, headaches or weakness.  She had a fall with a clock landing on her in December.  Did not get hurt and did not go to ED or UC but her lower back has been slightly worse since then.  No leg weakness or numbness, no incontinence.  Taking gabapentin for DDD.  Right renal mass - biochemical testing was normal for Pheo.  Further imaging was not done in December due to a recent fall with rib fractures.    CT Abd/pelvis 09/2022: IMPRESSION: 1. No CT evidence for acute intra-abdominal or pelvic abnormality. 2. Status post left nephrectomy. Possible solid enhancing mass in the upper right kidney. When the patient is clinically stable and able to follow directions and hold their breath (preferably as an outpatient) further evaluation with dedicated abdominal MRI should be considered. 3. 10 mm right adrenal nodule with density value of 96. This probably represents an adenoma, 1 year follow-up adrenal washout CT is recommended, if stable greater than or equal to 1 year, no further imaging follow-up is recommended 4. 15 mm left adrenal nodule with density value of 152. This may represent possible pheochromocytoma, recommend biochemical lab evaluation, if laboratory values are normal, 1 year follow-up adrenal washout CT is recommended. If stable greater than or equal to 1 year, no further follow-up imaging is recommended.  Lab Results   Component Value Date   NA 135 11/05/2022   K 3.2 (L) 11/05/2022   CO2 28 11/05/2022   GLUCOSE 132 (H) 11/05/2022   BUN 28 (H) 11/05/2022   CREATININE 1.05 (H) 11/05/2022   CALCIUM 8.9 11/05/2022   EGFR 80 05/17/2022   GFRNONAA 54 (L) 11/05/2022   Lab Results  Component Value Date   CHOL 140 01/15/2022   HDL 47 01/15/2022   LDLCALC 58 01/15/2022   TRIG 217 (H) 01/15/2022   CHOLHDL 3.0 01/15/2022   No results found for: "TSH" Lab Results  Component Value Date   HGBA1C 5.8 (H) 05/17/2022   Lab Results  Component Value Date   WBC 10.2 11/05/2022   HGB 13.2 11/05/2022   HCT 38.6 11/05/2022   MCV 88.9 11/05/2022   PLT 153 11/05/2022   Lab Results  Component Value Date   ALT 13 11/05/2022   AST 15 11/05/2022   ALKPHOS 52 11/05/2022   BILITOT 0.9 11/05/2022   Lab Results  Component Value Date   VD25OH 44.7 05/17/2022     Review of Systems  Constitutional:  Negative for fatigue and unexpected weight change.  HENT:  Negative for nosebleeds.   Eyes:  Negative for visual disturbance.  Respiratory:  Negative for cough, chest tightness, shortness of breath and wheezing.   Cardiovascular:  Negative for chest pain, palpitations and leg swelling.  Gastrointestinal:  Negative for abdominal pain, constipation and diarrhea.  Musculoskeletal:  Positive for back pain.  Neurological:  Negative for dizziness, weakness, light-headedness and headaches.  Psychiatric/Behavioral:  Negative for dysphoric mood and sleep  disturbance. The patient is not nervous/anxious.     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   Pessary maintenance 01/11/2018   Vaginal polyp 12/12/2017   Vaginal atrophy 12/12/2017    Cystocele, midline 12/12/2017   Chronic diarrhea 09/15/2017   Chronic renal impairment, 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   Alzheimer's dementia without behavioral disturbance 09/30/2016   Mixed incontinence 03/23/2016   B12 deficiency 02/03/2016   High risk medication use 01/15/2016   Primary insomnia 01/15/2016   Vitamin D deficiency, unspecified 01/15/2016   Microalbuminuria 09/23/2015   GERD without esophagitis 05/02/2015   History of cervical cancer 05/02/2015   History of hypothyroidism 05/02/2015   Urge incontinence 05/02/2015   Prediabetes 09/20/2014   Constipation by delayed colonic transit 08/30/2013   Rectocele 08/30/2013   Essential hypertension 07/02/2013    Allergies  Allergen Reactions   Mirabegron Other (See Comments)    Severe tremor, headache   Metformin And Related Diarrhea    Past Surgical History:  Procedure Laterality Date   ABDOMINAL HYSTERECTOMY     APPENDECTOMY  1993   BROW LIFT Bilateral 02/27/2019   Procedure: BLEPHAROPLASTY UPPER EYELID W/EXCESS SKIN;  Surgeon: Imagene Riches, MD;  Location: [facility];  Service: Ophthalmology;  Laterality: Bilateral;   BROW PTOSIS Bilateral 02/27/2019   Procedure: BROW PTOSIS REPAIR;  Surgeon: Imagene Riches, MD;  Location: [facility];  Service: Ophthalmology;  Laterality: Bilateral;  Diabetic - oral meds   CATARACT EXTRACTION W/PHACO Left 03/22/2017   Procedure: CATARACT EXTRACTION PHACO AND INTRAOCULAR LENS PLACEMENT (IOC) left diabetic;  Surgeon: Nevada Crane, MD;  Location: [facility];  Service: Ophthalmology;  Laterality: Left;  Diabetic   CATARACT EXTRACTION W/PHACO Right 05/17/2017   Procedure: CATARACT EXTRACTION PHACO AND INTRAOCULAR LENS PLACEMENT (IOC) Right diabetic;  Surgeon: Nevada Crane, MD;  Location: [facility];  Service: Ophthalmology;  Laterality: Right;   CHOLECYSTECTOMY     COLONOSCOPY WITH PROPOFOL N/A 11/14/2017   Procedure: COLONOSCOPY WITH PROPOFOL;   Surgeon: Christena Deem, MD;  Location: [facility] END[facility];  Service: Endoscopy;  Laterality: N/A;   EYE SURGERY     cataracts with extraocular prosthesis   KIDNEY SURGERY     gave daughter a kidney   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  Current Meds  Medication Sig   allopurinol (ZYLOPRIM) 100 MG tablet TAKE 2 TABLETS BY MOUTH EVERY DAY   aspirin EC 81 MG tablet Take 81 mg by mouth daily.   docusate sodium (COLACE) 100 MG capsule Take 1 capsule (100 mg total) by mouth 2 (two) times daily.   lisinopril-hydrochlorothiazide (ZESTORETIC) 20-12.5 MG tablet TAKE 1 TABLET BY MOUTH EVERY DAY   omeprazole (PRILOSEC) 40 MG capsule TAKE 1 CAPSULE (40 MG TOTAL) BY MOUTH DAILY.   ondansetron (ZOFRAN-ODT)  4 MG disintegrating tablet Take 1 tablet (4 mg total) by mouth every 6 (six) hours as needed for nausea or vomiting.   Skin Protectants, Misc. (EUCERIN) cream Apply topically 2 (two) times daily as needed (cheeks, forehead, chin where it is red/feels dry). Thin smear 1-2 times daily to affected areas.  Do not use any other face creams or soaps until redness improves (anticipate in next 2 weeks)   traZODone (DESYREL) 50 MG tablet Take 1 tablet (50 mg total) by mouth at bedtime as needed for sleep.   [DISCONTINUED] gabapentin (NEURONTIN) 300 MG capsule Take 3 capsules (900 mg total) by mouth at bedtime. Dr[facility]   [DISCONTINUED] sertraline (ZOLOFT) 50 MG tablet Take by mouth.   [DISCONTINUED] simvastatin (ZOCOR) 20 MG tablet Take 1 tablet (20 mg total) by mouth daily.       04/08/2023   10:00 AM 11/29/2022    9:50 AM 10/27/2022    1:33 PM 05/17/2022     8:16 AM  GAD 7 : Generalized Anxiety Score  Nervous, Anxious, on Edge 0 Control/stop worrying 0 Worry too much - different things 0 Trouble relaxing 0 Restless 0 Easily annoyed or irritable 0 Afraid - awful might happen 0 0 1 0  Total GAD 7 Score 0 Anxiety Difficulty Not difficult at all Not difficult at all Very difficult        04/08/2023    9:59 AM 11/29/2022    9:50 AM 10/27/2022    1:32 PM  Depression screen PHQ 2/9  Decreased Interest 0 1 3  Down, Depressed, Hopeless 0 1 1  PHQ - 2 Score 0 2 4  Altered sleeping 0 3 3  Tired, decreased energy 0 3 3  Change in appetite 0 3 3  Feeling bad or failure about yourself  0 0 1  Trouble concentrating 0 1 0  Moving slowly or fidgety/restless 0 0 2  Suicidal thoughts 0 0 0  PHQ-9 Score 0 12 16  Difficult doing work/chores Not difficult at all Not difficult at all Extremely dIfficult    BP Readings from Last 3 Encounters:  04/08/23 124/76  11/29/22 (!) 122/58  11/05/22 (!) 107/93    Physical Exam Vitals and nursing note reviewed.  Constitutional:      General: She is not in acute distress.    Appearance: She is well-developed.  HENT:     Head: Normocephalic and atraumatic.  Cardiovascular:     Rate and Rhythm: Normal rate and regular rhythm.  Pulmonary:     Effort: Pulmonary effort is normal. No respiratory distress.     Breath sounds: No wheezing or rhonchi.  Abdominal:     General: Abdomen is flat.     Palpations: Abdomen is soft.  Musculoskeletal:     Cervical back: Normal range of motion.     Lumbar back: Tenderness present. Negative right straight leg raise test and negative left straight leg raise test.  Skin:    General: Skin is warm and dry.     Findings: No rash.  Neurological:     Mental Status: She is alert and oriented to person, place, and time.     Sensory: Sensation is intact.     Motor: Motor function is intact.     Coordination: Coordination is  intact.     Deep Tendon  Reflexes:     Reflex Scores:      Patellar reflexes are 3+ on the right side and 3+ on the left side. Psychiatric:        Mood and Affect: Mood normal.        Behavior: Behavior normal.     Wt Readings from Last 3 Encounters:  04/08/23 94 lb (42.6 kg)  11/29/22 94 lb 3.2 oz (42.7 kg)  11/05/22 90 lb (40.8 kg)    BP 124/76   Pulse (!) 53   Ht 5' (1.524 m)   Wt 94 lb (42.6 kg)   SpO2 94%   BMI 18.36 kg/m   Assessment and Plan:  Problem List Items Addressed This Visit       Cardiovascular and Mediastinum   Essential hypertension - Primary (Chronic)    Clinically stable exam with well controlled BP on lisinopril hct. Tolerating medications without side effects. Pt to continue current regimen and low sodium diet.       Relevant Medications   simvastatin (ZOCOR) 20 MG tablet     Musculoskeletal and Integument   Lumbar degenerative disc disease (Chronic)    Mild worsening without worrisome features Continue gabapentin      Relevant Medications   gabapentin (NEURONTIN) 300 MG capsule     Genitourinary   Chronic renal impairment, stage 2 (mild) (Chronic)    GFR stable in the 50's s/p left nephrectomy (organ donation)      Relevant Orders   Basic metabolic panel     Other   Mixed hyperlipidemia   Relevant Medications   simvastatin (ZOCOR) 20 MG tablet   Prediabetes    Diet controlled Lab Results  Component Value Date   HGBA1C 5.8 (H) 05/17/2022        Right renal mass    Noted on CT in November Needs further imaging with MRI      Relevant Orders   MR Abdomen W Wo Contrast    Return in about 4 months (around 08/08/2023) for HTN and fasting labs.   Partially dictated using Dragon software, any errors are not intentional.  Reubin Milan, MD [facility] [place], [place]

## 2023-04-08 NOTE — Assessment & Plan Note (Signed)
Diet controlled Lab Results  Component Value Date   HGBA1C 5.8 (H) 05/17/2022

## 2023-04-08 NOTE — Assessment & Plan Note (Addendum)
GFR stable in the 50's s/p left nephrectomy (organ donation)

## 2023-04-09 LAB — BASIC METABOLIC PANEL
BUN/Creatinine Ratio: 18 (ref 12–28)
BUN: 13 mg/dL (ref 8–27)
CO2: 26 mmol/L (ref 20–29)
Calcium: 9.5 mg/dL (ref 8.7–10.3)
Chloride: 106 mmol/L (ref 96–106)
Creatinine, Ser: 0.73 mg/dL (ref 0.57–1.00)
Glucose: 101 mg/dL — ABNORMAL HIGH (ref 70–99)
Potassium: 4.3 mmol/L (ref 3.5–5.2)
Sodium: 146 mmol/L — ABNORMAL HIGH (ref 134–144)
eGFR: 83 mL/min/{1.73_m2} (ref >59)

## 2023-04-16 ENCOUNTER — Ambulatory Visit
Admission: EM | Admit: 2023-04-16 | Discharge: 2023-04-16 | Disposition: A | Discharge status: Home / Self Care | Payer: Medicare HMO | Attending: Family Medicine | Admitting: Family Medicine

## 2023-04-16 DIAGNOSIS — I10 Essential (primary) hypertension: Secondary | ICD-10-CM

## 2023-04-16 DIAGNOSIS — R21 Rash and other nonspecific skin eruption: Secondary | ICD-10-CM

## 2023-04-16 MED ORDER — TRIAMCINOLONE ACETONIDE 0.5 % EX OINT: 1.0000 | TOPICAL_OINTMENT | Freq: Two times a day (BID) | CUTANEOUS | 0 refills | Status: DC

## 2023-04-16 NOTE — Discharge Instructions (Signed)
Stop by the pharmacy to pick up your prescriptions. Do not use more than 14 days in a row. Take your blood pressure medications when you get home. Follow up with your primary care provider.

## 2023-04-16 NOTE — ED Provider Notes (Signed)
[facility]    CSN: [id number] Arrival date & time: 04/16/23  1429      History   Chief Complaint Chief Complaint  Patient presents with   Urticaria    HPI Candice Cook is a 82 y.o. female.   HPI  Candice Cook presents for itching for the past 2 days. She has bilateral arm and lower abdomen rash. Tried rubbing alcohol and hydrocortisone without relief. Two days ago she was pulling weeds away from her outhouse. She gets some relief while in the shower.  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   Wears dentures    full upper and lower    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   Pessary maintenance 01/11/2018   Vaginal polyp 12/12/2017   Vaginal atrophy 12/12/2017   Cystocele, midline 12/12/2017   Chronic diarrhea 09/15/2017   Chronic renal impairment, 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   Alzheimer's dementia without behavioral disturbance 09/30/2016   Mixed incontinence 03/23/2016   B12 deficiency 02/03/2016   High risk medication use 01/15/2016   Primary insomnia 01/15/2016   Vitamin D deficiency, unspecified 01/15/2016   Microalbuminuria 09/23/2015   GERD without esophagitis  05/02/2015   History of cervical cancer 05/02/2015   History of hypothyroidism 05/02/2015   Urge incontinence 05/02/2015   Prediabetes 09/20/2014   Constipation by delayed colonic transit 08/30/2013   Rectocele 08/30/2013   Essential hypertension 07/02/2013    Past Surgical History:  Procedure Laterality Date   ABDOMINAL HYSTERECTOMY     APPENDECTOMY  1993   BROW LIFT Bilateral 02/27/2019   Procedure: BLEPHAROPLASTY UPPER EYELID W/EXCESS SKIN;  Surgeon: Imagene Riches, MD;  Location: [facility];  Service: Ophthalmology;  Laterality: Bilateral;   BROW PTOSIS Bilateral 02/27/2019   Procedure: BROW PTOSIS REPAIR;  Surgeon: Imagene Riches, MD;  Location: [facility];  Service: Ophthalmology;  Laterality: Bilateral;  Diabetic - oral meds   CATARACT EXTRACTION W/PHACO Left 03/22/2017   Procedure: CATARACT EXTRACTION PHACO AND INTRAOCULAR LENS PLACEMENT (IOC) left diabetic;  Surgeon: Nevada Crane, MD;  Location: [facility];  Service: Ophthalmology;  Laterality: Left;  Diabetic   CATARACT EXTRACTION W/PHACO Right 05/17/2017   Procedure: CATARACT EXTRACTION PHACO AND INTRAOCULAR LENS PLACEMENT (IOC) Right diabetic;  Surgeon: Nevada Crane, MD;  Location: [facility];  Service: Ophthalmology;  Laterality: Right;   CHOLECYSTECTOMY     COLONOSCOPY WITH PROPOFOL N/A 11/14/2017   Procedure: COLONOSCOPY WITH PROPOFOL;  Surgeon: Christena Deem, MD;  Location: [facility];  Service: Endoscopy;  Laterality: N/A;   EYE SURGERY  cataracts with extraocular prosthesis   KIDNEY SURGERY     gave daughter a kidney   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 Authorizing  Provider  allopurinol (ZYLOPRIM) 100 MG tablet TAKE 2 TABLETS BY MOUTH EVERY DAY 12/20/22  Yes Reubin Milan, MD  aspirin EC 81 MG tablet Take 81 mg by mouth daily.   Yes [provider]  docusate sodium (COLACE) 100 MG capsule Take 1 capsule (100 mg total) by mouth 2 (two) times daily. 10/14/22 10/14/23 Yes Ward, Kristen N, DO  gabapentin (NEURONTIN) 300 MG capsule Take 3 capsules (900 mg total) by mouth at bedtime. Dr[facility] 04/08/23  Yes Reubin Milan, MD  lisinopril-hydrochlorothiazide (ZESTORETIC) 20-12.5 MG tablet TAKE 1 TABLET BY MOUTH EVERY DAY 08/03/22  Yes Reubin Milan, MD  omeprazole (PRILOSEC) 40 MG capsule TAKE 1 CAPSULE (40 MG TOTAL) BY MOUTH DAILY. 02/17/23  Yes Reubin Milan, MD  ondansetron (ZOFRAN-ODT) 4 MG disintegrating tablet Take 1 tablet (4 mg total) by mouth every 6 (six) hours as needed for nausea or vomiting. 10/14/22  Yes Ward, Kristen N, DO  sertraline (ZOLOFT) 50 MG tablet Take 1 tablet (50 mg total) by mouth at bedtime. 04/08/23  Yes Reubin Milan, MD  simvastatin (ZOCOR) 20 MG tablet Take 1 tablet (20 mg total) by mouth daily. 04/08/23  Yes Reubin Milan, MD  Skin Protectants, Misc. (EUCERIN) cream Apply topically 2 (two) times daily as needed (cheeks, forehead, chin where it is red/feels dry). Thin smear 1-2 times daily to affected areas.  Do not use any other face creams or soaps until redness improves (anticipate in next 2 weeks) 08/03/22  Yes Isa Rankin, MD  traZODone (DESYREL) 50 MG tablet Take 1 tablet (50 mg total) by mouth at bedtime as needed for sleep. 11/29/22  Yes Reubin Milan, MD  triamcinolone ointment (KENALOG) 0.5 % Apply 1 Application topically 2 (two) times daily. For moderate to severe eczema.  Do not use for more than 1 week at a time. 04/16/23  Yes Jennene Downie, DO  doxycycline (VIBRAMYCIN) 100 MG capsule Take 1 capsule (100 mg total) by mouth 2 (two) times daily. 04/20/23   Delenn Ahn, Seward Meth, DO   predniSONE (STERAPRED UNI-PAK 21 TAB) 10 MG (21) TBPK tablet Take by mouth daily. Stop by the pharmacy to pick up your prescriptions.  Follow up with your primary care provider as needed. 04/20/23   Katha Cabal, DO    Family History Family History  Problem Relation Age of Onset   Heart disease Mother    Heart disease Father    Diabetes Father    Hypertension Father    Cancer Brother    Kidney disease Daughter    Bladder Cancer Neg Hx    Kidney cancer Neg Hx    Breast cancer Neg Hx     Social History Social History  Tobacco Use   Smoking status: Former   Smokeless tobacco: Never   Tobacco comments:    quit 25 years  Vaping Use   Vaping Use: Never used  Substance Use Topics   Alcohol use: No    Alcohol/week: 0.0 standard drinks of alcohol   Drug use: No     Allergies   Mirabegron and Metformin and related   Review of Systems Review of Systems :negative unless otherwise stated in HPI.      Physical Exam Triage Vital Signs ED Triage Vitals  Enc Vitals Group     BP 04/16/23 1432 (!) 195/66     Pulse Rate 04/16/23 1432 67     Resp 04/16/23 1432 16     Temp 04/16/23 1432 98.5 F (36.9 C)     Temp Source 04/16/23 1432 Oral     SpO2 04/16/23 1432 94 %     Weight 04/16/23 1431 93 lb (42.2 kg)     Height 04/16/23 1431 5' (1.524 m)     Head Circumference --      Peak Flow --      Pain Score 04/16/23 1436 0     Pain Loc --      Pain Edu? --      Excl. in GC? --    No data found.  Updated Vital Signs BP (!) 199/71 (BP Location: Left Arm)   Pulse 67   Temp 98.5 F (36.9 C) (Oral)   Resp 16   Ht 5' (1.524 m)   Wt 42.2 kg   SpO2 94%   BMI 18.16 kg/m   Visual Acuity Right Eye Distance:   Left Eye Distance:   Bilateral Distance:    Right Eye Near:   Left Eye Near:    Bilateral Near:     Physical Exam  GEN: alert, well appearing female, in no acute distress  EYES: no scleral injection or discharge CV: regular rate and rhythm RESP: no  increased work of breathing, clear to ascultation bilaterally MSK: no extremity edema, no gross deformities NEURO: alert, moves all extremities appropriately, normal gait PSYCH: Normal affect, appropriate speech and behavior  SKIN: warm and dry; erythematous papules without primarily on arms, lower abdomen and slightly on lower legs     UC Treatments / Results  Labs (all labs ordered are listed, but only abnormal results are displayed) Labs Reviewed - No data to display  EKG   Radiology No results found.  Procedures Procedures (including critical care time)  Medications Ordered in UC Medications - No data to display  Initial Impression / Assessment and Plan / UC Course  I have reviewed the triage vital signs and the nursing notes.  Pertinent labs & imaging results that were available during my care of the patient were reviewed by me and considered in my medical decision making (see chart for details).     Patient is a 82 y.o. femalewho presents for pruritic rash for the past 2 days.  Overall, patient is well-appearing and well-hydrated.  Vital signs stable.  Candice Cook is afebrile.  Exam concerning for contact dermatitis.  Treat with steroid ointment.  No sign of infection to suggest antibiotics at this time.   Candice Cook is hypertensive here.  BP 195/56  then after sitting was 199/71. Pt reports she did not take her medication today. Takes lisinopril and HCTZ.  Denies needing refills. Recommended she check his blood pressure when the itching  subsides and follow up with his primary care provider in  the next 2 weeks.   Reviewed expectations regarding course of current medical issues.  All questions asked were answered.  Outlined signs and symptoms indicating need for more acute intervention. Patient verbalized understanding. After Visit Summary given.   Final Clinical Impressions(s) / UC Diagnoses   Final diagnoses:  Rash and nonspecific skin eruption     Discharge Instructions       Stop by the pharmacy to pick up your prescriptions. Do not use more than 14 days in a row. Take your blood pressure medications when you get home. Follow up with your primary care provider.     ED Prescriptions     Medication Sig Dispense Auth. Provider   triamcinolone ointment (KENALOG) 0.5 % Apply 1 Application topically 2 (two) times daily. For moderate to severe eczema.  Do not use for more than 1 week at a time. 30 g Katha Cabal, DO      PDMP not reviewed this encounter.              Katha Cabal, DO 04/20/23 1550

## 2023-04-16 NOTE — ED Triage Notes (Signed)
Pt c/o hives in bilateral arms & abd along w/itchiness x2 days. Denies any fevers,new foods,lotions or soaps.

## 2023-04-17 ENCOUNTER — Other Ambulatory Visit: Payer: Self-pay

## 2023-04-17 ENCOUNTER — Emergency Department
Admission: EM | Admit: 2023-04-17 | Discharge: 2023-04-17 | Discharge status: Against Medical Advice | Payer: Medicare HMO | Attending: Emergency Medicine | Admitting: Emergency Medicine

## 2023-04-17 DIAGNOSIS — L509 Urticaria, unspecified: Secondary | ICD-10-CM | POA: Diagnosis not present

## 2023-04-17 DIAGNOSIS — Z5321 Procedure and treatment not carried out due to patient leaving prior to being seen by health care provider: Secondary | ICD-10-CM | POA: Insufficient documentation

## 2023-04-17 DIAGNOSIS — R21 Rash and other nonspecific skin eruption: Secondary | ICD-10-CM | POA: Diagnosis present

## 2023-04-17 NOTE — ED Triage Notes (Signed)
Pt c/o rash and hives for 4 days and went to UC and they gave her medication of a topical cream. Pt still has the rash and sts it is very itchy. She used benadryl cream pta

## 2023-04-20 ENCOUNTER — Ambulatory Visit
Admission: RE | Admit: 2023-04-20 | Discharge: 2023-04-20 | Disposition: A | Discharge status: Home / Self Care | Payer: Medicare HMO | Source: Ambulatory Visit | Attending: Internal Medicine | Admitting: Internal Medicine

## 2023-04-20 ENCOUNTER — Ambulatory Visit
Admission: EM | Admit: 2023-04-20 | Discharge: 2023-04-20 | Disposition: A | Discharge status: Home / Self Care | Payer: Medicare HMO | Attending: Family Medicine | Admitting: Family Medicine

## 2023-04-20 DIAGNOSIS — I10 Essential (primary) hypertension: Secondary | ICD-10-CM | POA: Diagnosis not present

## 2023-04-20 DIAGNOSIS — N2889 Other specified disorders of kidney and ureter: Secondary | ICD-10-CM | POA: Insufficient documentation

## 2023-04-20 DIAGNOSIS — R21 Rash and other nonspecific skin eruption: Secondary | ICD-10-CM | POA: Diagnosis not present

## 2023-04-20 LAB — CBC WITH DIFFERENTIAL/PLATELET
Abs Immature Granulocytes: 0.03 10*3/uL (ref 0.00–0.07)
Basophils Absolute: 0.1 10*3/uL (ref 0.0–0.1)
Basophils Relative: 1 %
Eosinophils Absolute: 0.3 10*3/uL (ref 0.0–0.5)
Eosinophils Relative: 3 %
HCT: 39.4 % (ref 36.0–46.0)
Hemoglobin: 13 g/dL (ref 12.0–15.0)
Immature Granulocytes: 0 %
Lymphocytes Relative: 59 %
Lymphs Abs: 6 10*3/uL — ABNORMAL HIGH (ref 0.7–4.0)
MCH: 29.5 pg (ref 26.0–34.0)
MCHC: 33 g/dL (ref 30.0–36.0)
MCV: 89.5 fL (ref 80.0–100.0)
Monocytes Absolute: 0.3 10*3/uL (ref 0.1–1.0)
Monocytes Relative: 3 %
Neutro Abs: 3.5 10*3/uL (ref 1.7–7.7)
Neutrophils Relative %: 34 %
Platelets: 149 10*3/uL — ABNORMAL LOW (ref 150–400)
RBC: 4.4 MIL/uL (ref 3.87–5.11)
RDW: 15 % (ref 11.5–15.5)
WBC: 10.2 10*3/uL (ref 4.0–10.5)
nRBC: 0 % (ref 0.0–0.2)

## 2023-04-20 LAB — COMPREHENSIVE METABOLIC PANEL
ALT: 14 U/L (ref 0–44)
AST: 21 U/L (ref 15–41)
Albumin: 3.7 g/dL (ref 3.5–5.0)
Alkaline Phosphatase: 54 U/L (ref 38–126)
Anion gap: 3 — ABNORMAL LOW (ref 5–15)
BUN: 12 mg/dL (ref 8–23)
CO2: 32 mmol/L (ref 22–32)
Calcium: 8.5 mg/dL — ABNORMAL LOW (ref 8.9–10.3)
Chloride: 101 mmol/L (ref 98–111)
Creatinine, Ser: 0.74 mg/dL (ref 0.44–1.00)
GFR, Estimated: >60 mL/min (ref >60)
Glucose, Bld: 146 mg/dL — ABNORMAL HIGH (ref 70–99)
Potassium: 3.2 mmol/L — ABNORMAL LOW (ref 3.5–5.1)
Sodium: 136 mmol/L (ref 135–145)
Total Bilirubin: 0.5 mg/dL (ref 0.3–1.2)
Total Protein: 6.2 g/dL — ABNORMAL LOW (ref 6.5–8.1)

## 2023-04-20 MED ORDER — DOXYCYCLINE HYCLATE 100 MG PO CAPS: 100.0000 mg | ORAL_CAPSULE | Freq: Two times a day (BID) | ORAL | 0 refills | Status: DC

## 2023-04-20 MED ORDER — DEXAMETHASONE SODIUM PHOSPHATE 10 MG/ML IJ SOLN
10.0000 mg | Freq: Once | INTRAMUSCULAR | Status: AC
  Administered 2023-04-20: 10 mg via INTRAMUSCULAR

## 2023-04-20 MED ORDER — PREDNISONE 10 MG (21) PO TBPK: ORAL_TABLET | Freq: Every day | ORAL | 0 refills | Status: DC

## 2023-04-20 MED ORDER — GADOBUTROL 1 MMOL/ML IV SOLN
4.0000 mL | Freq: Once | INTRAVENOUS | Status: AC | PRN
  Administered 2023-04-20: 4 mL via INTRAVENOUS

## 2023-04-20 NOTE — ED Triage Notes (Signed)
Pt c/o hives along arms, chest, stomach, and thighs. Pt states that it has been going on for 4 days, but was present on 04/17/23 and states that she has had hives for 4 days by then as well.   Pt asks for something to help dry up the rash and hives.   Pt states that the medication given last time - Kenaolog 0.5% - did not help.

## 2023-04-20 NOTE — Discharge Instructions (Signed)
Be sure to take your blood pressures daily.   Schedule a follow up appointment with your primary care provider to discuss your blood pressure.

## 2023-04-20 NOTE — ED Provider Notes (Signed)
[facility]    CSN: [id number] Arrival date & time: 04/20/23  1430      History   Chief Complaint Chief Complaint  Patient presents with   Rash    HPI Candice Cook is a 82 y.o. female.   HPI  Candice Cook presents again for rash that is not improving.  Rash started about a week ago after doing yard work.  She has been applying the steroid ointment without relief of her itching. The rash has spread across her body more than it was a few days ago. She is otherwise in her normal state of health.    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   Wears dentures    full upper and lower    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   Pessary maintenance 01/11/2018   Vaginal polyp 12/12/2017   Vaginal atrophy 12/12/2017   Cystocele, midline 12/12/2017   Chronic diarrhea 09/15/2017   Chronic renal impairment, 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   Alzheimer's dementia without behavioral disturbance (HCC) 09/30/2016   Mixed incontinence 03/23/2016   B12 deficiency 02/03/2016   High risk medication use 01/15/2016   Primary insomnia 01/15/2016   Vitamin D deficiency, unspecified 01/15/2016   Microalbuminuria 09/23/2015    GERD without esophagitis 05/02/2015   History of cervical cancer 05/02/2015   History of hypothyroidism 05/02/2015   Urge incontinence 05/02/2015   Prediabetes 09/20/2014   Constipation by delayed colonic transit 08/30/2013   Rectocele 08/30/2013   Essential hypertension 07/02/2013    Past Surgical History:  Procedure Laterality Date   ABDOMINAL HYSTERECTOMY     APPENDECTOMY  1993   BROW LIFT Bilateral 02/27/2019   Procedure: BLEPHAROPLASTY UPPER EYELID W/EXCESS SKIN;  Surgeon: Imagene Riches, MD;  Location: [facility];  Service: Ophthalmology;  Laterality: Bilateral;   BROW PTOSIS Bilateral 02/27/2019   Procedure: BROW PTOSIS REPAIR;  Surgeon: Imagene Riches, MD;  Location: [facility];  Service: Ophthalmology;  Laterality: Bilateral;  Diabetic - oral meds   CATARACT EXTRACTION W/PHACO Left 03/22/2017   Procedure: CATARACT EXTRACTION PHACO AND INTRAOCULAR LENS PLACEMENT (IOC) left diabetic;  Surgeon: Nevada Crane, MD;  Location: [facility];  Service: Ophthalmology;  Laterality: Left;  Diabetic   CATARACT EXTRACTION W/PHACO Right 05/17/2017   Procedure: CATARACT EXTRACTION PHACO AND INTRAOCULAR LENS PLACEMENT (IOC) Right diabetic;  Surgeon: Nevada Crane, MD;  Location: [facility];  Service: Ophthalmology;  Laterality: Right;   CHOLECYSTECTOMY     COLONOSCOPY WITH PROPOFOL N/A 11/14/2017   Procedure: COLONOSCOPY WITH PROPOFOL;  Surgeon: Christena Deem, MD;  Location: [facility]  ENDOSCOPY;  Service: Endoscopy;  Laterality: N/A;   EYE SURGERY     cataracts with extraocular prosthesis   KIDNEY SURGERY     gave daughter a kidney   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 Authorizing Provider  allopurinol (ZYLOPRIM) 100 MG tablet TAKE 2 TABLETS BY MOUTH EVERY DAY 12/20/22  Yes Reubin Milan, MD  aspirin EC 81 MG tablet Take 81 mg by mouth daily.   Yes [provider]  doxycycline (VIBRAMYCIN) 100 MG capsule Take 1 capsule (100 mg total) by mouth 2 (two) times daily. 04/20/23  Yes Arthea Nobel, DO  gabapentin (NEURONTIN) 300 MG capsule Take 3 capsules (900 mg total) by mouth at bedtime. Dr. [facility] 04/08/23  Yes Reubin Milan, MD  omeprazole (PRILOSEC) 40 MG capsule TAKE 1 CAPSULE (40 MG TOTAL) BY MOUTH DAILY. 02/17/23  Yes Reubin Milan, MD  ondansetron (ZOFRAN-ODT) 4 MG disintegrating tablet Take 1 tablet (4 mg total) by mouth every 6 (six) hours as needed for nausea or vomiting. 10/14/22  Yes Ward, Layla Maw, DO  predniSONE (STERAPRED UNI-PAK 21 TAB) 10 MG (21) TBPK tablet Take by mouth daily. Stop by the pharmacy to pick up your prescriptions.  Follow up with your primary care provider as needed. 04/20/23  Yes Milik Gilreath, DO  sertraline (ZOLOFT) 50 MG tablet Take 1 tablet (50 mg total) by mouth at bedtime. 04/08/23  Yes Reubin Milan, MD  simvastatin (ZOCOR) 20 MG tablet Take 1 tablet (20 mg total) by mouth daily. 04/08/23  Yes Reubin Milan, MD  traZODone (DESYREL) 50 MG tablet Take 1 tablet (50 mg total) by mouth at bedtime as needed for sleep. 11/29/22  Yes Reubin Milan, MD  docusate sodium (COLACE) 100 MG capsule Take 1 capsule (100 mg total) by mouth 2 (two) times daily. 10/14/22 10/14/23  Ward, Layla Maw, DO  lisinopril-hydrochlorothiazide (ZESTORETIC) 20-12.5 MG tablet TAKE 1 TABLET BY MOUTH EVERY DAY 04/21/23   Reubin Milan, MD  Skin Protectants, Misc. (EUCERIN) cream Apply topically 2 (two) times daily as needed (cheeks, forehead, chin where it is red/feels dry). Thin smear 1-2 times daily to affected areas.  Do not use any other face creams or soaps until redness improves (anticipate in next 2  weeks) 08/03/22   Isa Rankin, MD  triamcinolone ointment (KENALOG) 0.5 % Apply 1 Application topically 2 (two) times daily. For moderate to severe eczema.  Do not use for more than 1 week at a time. 04/16/23   Katha Cabal, DO    Family History Family History  Problem Relation Age of Onset   Heart disease Mother    Heart disease Father    Diabetes Father    Hypertension Father    Cancer Brother    Kidney disease Daughter    Bladder Cancer Neg Hx    Kidney cancer Neg Hx  Breast cancer Neg Hx     Social History Social History   Tobacco Use   Smoking status: Former   Smokeless tobacco: Never   Tobacco comments:    quit 25 years  Vaping Use   Vaping Use: Never used  Substance Use Topics   Alcohol use: No    Alcohol/week: 0.0 standard drinks of alcohol   Drug use: No     Allergies   Mirabegron and Metformin and related   Review of Systems Review of Systems :negative unless otherwise stated in HPI.      Physical Exam Triage Vital Signs ED Triage Vitals  Enc Vitals Group     BP 04/20/23 1503 (!) 183/73     Pulse Rate 04/20/23 1503 65     Resp --      Temp 04/20/23 1503 97.8 F (36.6 C)     Temp Source 04/20/23 1503 Oral     SpO2 04/20/23 1503 98 %     Weight 04/20/23 1501 80 lb (36.3 kg)     Height 04/20/23 1501 5' (1.524 m)     Head Circumference --      Peak Flow --      Pain Score 04/20/23 1500 10     Pain Loc --      Pain Edu? --      Excl. in GC? --    No data found.  Updated Vital Signs BP (!) 175/73 (BP Location: Left Arm)   Pulse 65   Temp 97.8 F (36.6 C) (Oral)   Ht 5' (1.524 m)   Wt 36.3 kg   SpO2 98%   BMI 15.62 kg/m   Visual Acuity Right Eye Distance:   Left Eye Distance:   Bilateral Distance:    Right Eye Near:   Left Eye Near:    Bilateral Near:     Physical Exam  GEN: alert, well appearing female, in no acute distress  EYES: extra occular movements intact, no scleral injection HENT: no lesions or petechiae,  moist membranes   CV: regular rate and rhythm RESP: no increased work of breathing, clear to ascultation bilaterally MSK: no extremity edema, no gross deformities NEURO: alert, moves all extremities appropriately, normal gait PSYCH: Normal affect, appropriate speech and behavior  SKIN: warm and dry; diffuse erythematous papules on abdomen, extremities with excoriations, she has some lesions on the palms near the wrists    UC Treatments / Results  Labs (all labs ordered are listed, but only abnormal results are displayed) Labs Reviewed  CBC WITH DIFFERENTIAL/PLATELET - Abnormal; Notable for the following components:      Result Value   Platelets 149 (*)    Lymphs Abs 6.0 (*)    All other components within normal limits  COMPREHENSIVE METABOLIC PANEL - Abnormal; Notable for the following components:   Potassium 3.2 (*)    Glucose, Bld 146 (*)    Calcium 8.5 (*)    Total Protein 6.2 (*)    Anion gap 3 (*)    All other components within normal limits  RPR    EKG   Radiology No results found.  Procedures Procedures (including critical care time)  Medications Ordered in UC Medications  dexamethasone (DECADRON) injection 10 mg (10 mg Intramuscular Given 04/20/23 1550)    Initial Impression / Assessment and Plan / UC Course  I have reviewed the triage vital signs and the nursing notes.  Pertinent labs & imaging results that were available during my care of the patient  were reviewed by me and considered in my medical decision making (see chart for details).     Patient is a 82 y.o. femalewho presents for ongoing rash .  Overall, patient is well-appearing and well-hydrated.  Vital signs stable.  Candice Cook is afebrile. DDx contact vs irritant dermatitis, impetigo, erythema multiforme, papular hives, syphilis, viral exanthem, autoimmune rash.   CBC without leukocytosis however she does have a left shift in her lymphocytes.  She has a mild hypokalemia, potassium 3.2 with calcium 8.5  and a low total protein.  RPR obtained.  Recommended steroids patient is agreeable.  Request IM Decadron.  Decadron 10 mg IM given.  Treat with steroid taper and see doxycyline to cover for impetigo.   Reviewed expectations regarding course of current medical issues.  All questions asked were answered.  Outlined signs and symptoms indicating need for more acute intervention. Patient verbalized understanding. After Visit Summary given.   Final Clinical Impressions(s) / UC Diagnoses   Final diagnoses:  Rash  Elevated blood pressure reading with diagnosis of hypertension     Discharge Instructions      Be sure to take your blood pressures daily.   Schedule a follow up appointment with your primary care provider to discuss your blood pressure.        ED Prescriptions     Medication Sig Dispense Auth. Provider   predniSONE (STERAPRED UNI-PAK 21 TAB) 10 MG (21) TBPK tablet Take by mouth daily. Stop by the pharmacy to pick up your prescriptions.  Follow up with your primary care provider as needed. 21 tablet Zacharee Gaddie, DO   doxycycline (VIBRAMYCIN) 100 MG capsule Take 1 capsule (100 mg total) by mouth 2 (two) times daily. 20 capsule Katha Cabal, DO      PDMP not reviewed this encounter.              Katha Cabal, DO 04/26/23 2315

## 2023-04-21 ENCOUNTER — Other Ambulatory Visit: Payer: Self-pay | Admitting: Internal Medicine

## 2023-04-21 DIAGNOSIS — I10 Essential (primary) hypertension: Secondary | ICD-10-CM

## 2023-04-21 LAB — RPR: RPR Ser Ql: NONREACTIVE

## 2023-04-25 ENCOUNTER — Ambulatory Visit: Payer: Self-pay | Admitting: *Deleted

## 2023-04-25 ENCOUNTER — Telehealth: Payer: Self-pay | Admitting: Internal Medicine

## 2023-04-25 NOTE — Telephone Encounter (Signed)
Call from [facility] from [facility] call to report results from MRI abdomen for 04/23/23 . Please review impression and requesting f/u PET CT may be helpful adjunct imaging to further characterize. Please advise results in chart .

## 2023-04-25 NOTE — Telephone Encounter (Signed)
Copied from CRM [id number]. Topic: General - Other >> Apr 25, 2023 10:31 AM Epimenio Foot F wrote: Reason for CRM: Ray from [facility] was calling to give the results for pt's MRI. Ray says the results are in Epic and to call him to confirm the receipt of the results-[phone].

## 2023-04-25 NOTE — Telephone Encounter (Signed)
MRI result received. Dr. Judithann Graves aware.  KP

## 2023-04-25 NOTE — Telephone Encounter (Signed)
Please review.  KP

## 2023-04-26 ENCOUNTER — Other Ambulatory Visit: Payer: Self-pay

## 2023-04-26 DIAGNOSIS — N2889 Other specified disorders of kidney and ureter: Secondary | ICD-10-CM

## 2023-05-03 ENCOUNTER — Encounter: Payer: Self-pay | Admitting: Urology

## 2023-05-03 ENCOUNTER — Ambulatory Visit (INDEPENDENT_AMBULATORY_CARE_PROVIDER_SITE_OTHER): Payer: Medicare HMO | Admitting: Urology

## 2023-05-03 VITALS — BP 150/86 | HR 58 | Ht 60.0 in | Wt 95.6 lb

## 2023-05-03 DIAGNOSIS — Z905 Acquired absence of kidney: Secondary | ICD-10-CM

## 2023-05-03 DIAGNOSIS — N3946 Mixed incontinence: Secondary | ICD-10-CM

## 2023-05-03 DIAGNOSIS — N3281 Overactive bladder: Secondary | ICD-10-CM | POA: Diagnosis not present

## 2023-05-03 DIAGNOSIS — N2889 Other specified disorders of kidney and ureter: Secondary | ICD-10-CM | POA: Diagnosis not present

## 2023-05-03 DIAGNOSIS — N3941 Urge incontinence: Secondary | ICD-10-CM

## 2023-05-03 MED ORDER — GEMTESA 75 MG PO TABS
75.0000 mg | ORAL_TABLET | Freq: Every day | ORAL | 0 refills | Status: DC
Start: 2023-05-03 — End: 2023-06-06

## 2023-05-03 NOTE — Progress Notes (Signed)
05/03/23 2:59 PM   Candice Cook 1941-07-16 [id number]  CC: Right renal mass, solitary kidney, incontinence  HPI: I saw Candice Cook today for the above issues.  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  A CT in October 2023 performed for abdominal pain showed a small 1.5 cm right upper pole enhancing lesion.  A follow-up MRI in April 2024 showed a stable enhancing right upper pole lesion with no change in size.  Renal function is normal with recent creatinine 0.74, EGFR greater than 60.  She also has a long history of urinary symptoms with primarily urge incontinence.  She previously had a pessary in place and was followed by GYN which significantly improved her symptoms, however she reports her GYN retired and she has not seen them recently.  She no longer has the pessary.  She was followed in the distant past by Candice Cowboy, PA and had significant OAB symptom improvement with Myrbetriq, but had severe side effects reportedly.  She also had severe dry mouth from anticholinergics, and never ultimately pursued PTNS.  She denies significant stress incontinence.  Denies any dysuria or gross hematuria.  Urinalysis and November showed no microscopic hematuria and culture was negative.   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   Wears dentures    full upper and lower    Surgical History: Past Surgical  History:  Procedure Laterality Date   ABDOMINAL HYSTERECTOMY     APPENDECTOMY  1993   BROW LIFT Bilateral 02/27/2019   Procedure: BLEPHAROPLASTY UPPER EYELID W/EXCESS SKIN;  Surgeon: Imagene Riches, MD;  Location: [facility];  Service: Ophthalmology;  Laterality: Bilateral;   BROW PTOSIS Bilateral 02/27/2019   Procedure: BROW PTOSIS REPAIR;  Surgeon: Imagene Riches, MD;  Location: [facility];  Service: Ophthalmology;  Laterality: Bilateral;  Diabetic - oral meds   CATARACT EXTRACTION W/PHACO Left 03/22/2017   Procedure: CATARACT EXTRACTION PHACO AND INTRAOCULAR LENS PLACEMENT (IOC) left diabetic;  Surgeon: Nevada Crane, MD;  Location: [facility];  Service: Ophthalmology;  Laterality: Left;  Diabetic   CATARACT EXTRACTION W/PHACO Right 05/17/2017   Procedure: CATARACT EXTRACTION PHACO AND INTRAOCULAR LENS PLACEMENT (IOC) Right diabetic;  Surgeon: Nevada Crane, MD;  Location: [facility];  Service: Ophthalmology;  Laterality: Right;   CHOLECYSTECTOMY     COLONOSCOPY WITH PROPOFOL N/A 11/14/2017   Procedure: COLONOSCOPY WITH PROPOFOL;  Surgeon: Christena Deem, MD;  Location: [facility];  Service: Endoscopy;  Laterality: N/A;   EYE SURGERY     cataracts with extraocular prosthesis   KIDNEY SURGERY     gave daughter a kidney   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 She has never used smokeless tobacco. She reports that she does not drink alcohol and does not use drugs.  Physical Exam: BP (!) 150/86 (BP Location: Left Arm, Patient Position: Sitting, Cuff Size: Normal)   Pulse (!) 58   Ht 5' (1.524 m)    Wt 95 lb 9.6 oz (43.4 kg)   BMI 18.67 kg/m    Constitutional:  Alert and oriented, No acute distress. Cardiovascular: No clubbing, cyanosis, or edema. Respiratory: Normal respiratory effort, no increased work of breathing. GI: Abdomen is soft, nontender, nondistended, no abdominal masses    Pertinent Imaging: I have personally viewed and interpreted the prior CT from October 2023 as well as the MRI from April 2024 showing a stable 1.5 cm enhancing right renal mass.  Assessment & Plan:   82 year old female here for 2 issues, small 1.5 cm right upper pole mass and a solitary right kidney with normal renal function, as well as urge incontinence and overactive bladder.  Regarding the small right renal mass, this is stable from October 2023, and even if this represents a small RCC active surveillance is a very reasonable option with her age, solitary kidney, and frailty moving forward.  I recommended a repeat MRI in 1 year and she is in agreement.  We discussed other options like referral to interventional radiology for consideration of percutaneous ablation, or partial nephrectomy, but she would like to pursue active surveillance which is very reasonable.  We discussed the <5% risk of developing metastatic disease or symptoms over the next 15 years.  Regarding her OAB symptoms, I offered her referral back to GYN for consideration of resuming pessary, but she deferred.  She was interested in trying a medication for her OAB symptoms, and samples of Gemtesa were provided.  Trial of Gemtesa for OAB symptoms, RTC with PA 4 to 6 weeks symptom check RTC with me 1 year MRI prior for active surveillance of small 1.5 cm right renal mass and solitary kidney  Legrand Rams, MD 05/03/2023  [facility] [address], [address] [place], [place] [address] [phone]

## 2023-05-10 ENCOUNTER — Other Ambulatory Visit: Payer: Self-pay | Admitting: Internal Medicine

## 2023-05-10 DIAGNOSIS — M109 Gout, unspecified: Secondary | ICD-10-CM

## 2023-05-11 NOTE — Telephone Encounter (Signed)
Unable to refill per protocol, Rx request is too soon. Last refill 12/20/22 for 90 and 1 refill.  Requested Prescriptions  Pending Prescriptions Disp Refills   allopurinol (ZYLOPRIM) 100 MG tablet [Pharmacy Med Name: ALLOPURINOL 100 MG TABLET] 180 tablet 1    Sig: TAKE 2 TABLETS BY MOUTH EVERY DAY     Endocrinology:  Gout Agents - allopurinol Passed - 05/10/2023  6:09 PM      Passed - Uric Acid in normal range and within 360 days    Uric Acid  Date Value Ref Range Status  05/17/2022 4.0 3.1 - 7.9 mg/dL Final    Comment:               Therapeutic target for gout patients: <6.0         Passed - Cr in normal range and within 360 days    Creatinine  Date Value Ref Range Status  02/11/2014 0.98 0.60 - 1.30 mg/dL Final   Creatinine, Ser  Date Value Ref Range Status  04/20/2023 0.74 0.44 - 1.00 mg/dL Final         Passed - Valid encounter within last 12 months    Recent Outpatient Visits           1 month ago Essential hypertension   [place] [facility] at [facility], Nyoka Cowden, MD   5 months ago Essential hypertension   [place] [facility] at [facility], Nyoka Cowden, MD   6 months ago Essential hypertension   [place] [facility] at [facility], Nyoka Cowden, MD   11 months ago Essential hypertension   [place] [facility] at [facility], Nyoka Cowden, MD   1 year ago Benign hypertension   [place] [facility] at [facility], Nyoka Cowden, MD       Future Appointments             In 3 weeks McGowan, Elana Alm [facility] [place]   In 3 months Reubin Milan, MD [facility] at [facility]   In 12 months Sondra Come, MD [facility] [place]            Passed - CBC within normal limits and completed in the last 12 months    WBC  Date Value Ref  Range Status  04/20/2023 10.2 4.0 - 10.5 K/uL Final   RBC  Date Value Ref Range Status  04/20/2023 4.40 3.87 - 5.11 MIL/uL Final   Hemoglobin  Date Value Ref Range Status  04/20/2023 13.0 12.0 - 15.0 g/dL Final  16/09/9603 54.0 11.1 - 15.9 g/dL Final   HCT  Date Value Ref Range Status  04/20/2023 39.4 36.0 - 46.0 % Final   Hematocrit  Date Value Ref Range Status  01/15/2022 36.2 34.0 - 46.6 % Final   MCHC  Date Value Ref Range Status  04/20/2023 33.0 30.0 - 36.0 g/dL Final   [facility]  Date Value Ref Range Status  04/20/2023 29.5 26.0 - 34.0 pg Final   MCV  Date Value Ref Range Status  04/20/2023 89.5 80.0 - 100.0 fL Final  01/15/2022 90 79 - 97 fL Final  02/11/2014 88 80 - 100 fL Final   No results found for: "PLTCOUNTKUC", "LABPLAT", "POCPLA" RDW  Date Value Ref Range Status  04/20/2023 15.0 11.5 - 15.5 %  Final  01/15/2022 13.5 11.7 - 15.4 % Final  02/11/2014 14.7 (H) 11.5 - 14.5 % Final

## 2023-05-22 ENCOUNTER — Other Ambulatory Visit: Payer: Self-pay | Admitting: Internal Medicine

## 2023-05-22 DIAGNOSIS — F5101 Primary insomnia: Secondary | ICD-10-CM

## 2023-05-24 NOTE — Telephone Encounter (Signed)
Requested Prescriptions  Pending Prescriptions Disp Refills   traZODone (DESYREL) 50 MG tablet [Pharmacy Med Name: TRAZODONE 50 MG TABLET] 90 tablet 1    Sig: TAKE 1 TABLET BY MOUTH AT BEDTIME AS NEEDED FOR SLEEP.     Psychiatry: Antidepressants - Serotonin Modulator Passed - 05/22/2023  9:00 AM      Passed - Valid encounter within last 6 months    Recent Outpatient Visits           1 month ago Essential hypertension   [place] [facility] at [facility], Nyoka Cowden, MD   5 months ago Essential hypertension   [place] [facility] at [facility], Nyoka Cowden, MD   6 months ago Essential hypertension   [place] [facility] at [facility], Nyoka Cowden, MD   1 year ago Essential hypertension   [place] [facility] at [facility], Nyoka Cowden, MD   1 year ago Benign hypertension   [place] [facility] at [place], Nyoka Cowden, MD       Future Appointments             In 1 week McGowan, Elana Alm [facility] [place]   In 3 months Judithann Graves Nyoka Cowden, MD [facility] at [facility]   In 11 months Richardo Hanks, Laurette Schimke, MD [facility] [place]

## 2023-06-05 NOTE — Progress Notes (Unsigned)
06/06/23 3:04 PM   Candice Cook 16-Dec-1941 [id number]  CC: Right renal mass, solitary kidney, incontinence  HPI: Mrs. Candice Cook saw Dr. Richardo Hanks on 05/03/2023.  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  A CT in October 2023 performed for abdominal pain showed a small 1.5 cm right upper pole enhancing lesion.  A follow-up MRI in April 2024 showed a stable enhancing right upper pole lesion with no change in size.  Renal function is normal with recent creatinine 0.74, EGFR greater than 60.  She also has a long history of urinary symptoms with primarily urge incontinence.  She previously had a pessary in place and was followed by GYN which significantly improved her symptoms, however she reports her GYN retired and she has not seen them recently.  She no longer has the pessary.  She was followed in the distant past by Michiel Cowboy, PA and had significant OAB symptom improvement with Myrbetriq, but had severe side effects reportedly.  She also had severe dry mouth from anticholinergics, and never ultimately pursued PTNS.  She denies significant stress incontinence.  Denies any dysuria or gross hematuria.  Urinalysis and November showed no microscopic hematuria and culture was negative.  She was given Gemtesa 75 mg daily samples and scheduled to return in one year for surveillance for her right renal mass.    She is having 7 daytime urinations, nocturia x 0 and a mild urge to urinate.  She goes to 2 depends daily.  She does not limit fluid intakes and she does not engage in toilet mapping.  Patient denies any modifying or aggravating factors.  Patient denies any gross hematuria, dysuria or suprapubic/flank pain.  Patient denies any fevers, chills, nausea or vomiting.    She states that [place] samples worked great and she like to continue that medication.  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;  Service: Ophthalmology;  Laterality: Bilateral;  Diabetic - oral meds   CATARACT EXTRACTION W/PHACO Left 03/22/2017   Procedure: CATARACT EXTRACTION PHACO AND INTRAOCULAR LENS PLACEMENT (IOC) left diabetic;  Surgeon: Nevada Crane, MD;  Location: [facility];  Service: Ophthalmology;  Laterality: Left;  Diabetic   CATARACT EXTRACTION W/PHACO Right 05/17/2017   Procedure: CATARACT  EXTRACTION PHACO AND INTRAOCULAR LENS PLACEMENT (IOC) Right diabetic;  Surgeon: Nevada Crane, MD;  Location: [facility];  Service: Ophthalmology;  Laterality: Right;   CHOLECYSTECTOMY     COLONOSCOPY WITH PROPOFOL N/A 11/14/2017   Procedure: COLONOSCOPY WITH PROPOFOL;  Surgeon: Christena Deem, MD;  Location: [facility] ENDOSCOPY;  Service: Endoscopy;  Laterality: N/A;   EYE SURGERY     cataracts with extraocular prosthesis   KIDNEY SURGERY     gave daughter a kidney   NEPHRECTOMY Left 1993   donated to her  daughter    Family History: Family History  Problem Relation Age of Onset   Heart disease Mother    Heart disease Father    Diabetes Father    Hypertension Father    Cancer Brother    Kidney disease Daughter    Bladder Cancer Neg Hx    Kidney cancer Neg Hx    Breast cancer Neg Hx     Social History:  reports that she has quit smoking. She has never used smokeless tobacco. She reports that she does not drink alcohol and does not use drugs.  Physical Exam: BP (!) 183/66 (BP Location: Left Arm, Patient Position: Sitting, Cuff Size: Normal) Comment: no BP meds today  Pulse (!) 54   Ht 5' (1.524 m)   Wt 95 lb (43.1 kg)   BMI 18.55 kg/m   Constitutional:  Well nourished. Alert and oriented, No acute distress. HEENT: [place] AT, moist mucus membranes.  Trachea midline Cardiovascular: No clubbing, cyanosis, or edema. Respiratory: Normal respiratory effort, no increased work of breathing. Neurologic: Grossly intact, no focal deficits, moving all 4 extremities. Psychiatric: Normal mood and affect.    Pertinent Imaging:  06/06/23 15:03  Scan Result 32ml    Assessment & Plan:   82 year old female here for 2 issues, small 1.5 cm right upper pole mass and a solitary right kidney with normal renal function, as well as urge incontinence and overactive bladder.  Regarding the small right renal mass, this is stable from October 2023, and even if this represents a small RCC active surveillance is a very reasonable option with her age, solitary kidney, and frailty moving forward.  She is scheduled for a yearly follow up with Dr. Richardo Hanks.  Regarding her OAB symptoms, I offered to put her pessary back in place, but she deferred.  She is at goal with the [facility].   I explained to her that [name] is a brand-name medication that we may have difficulty getting coverage.  I have given her samples and she will contact me regarding insurance coverage once she receives word from her pharmacy.  Continue  Gemtesa for OAB symptoms, RTC with me in three months for OAB and PVR  RTC with Dr. Richardo Hanks 1 year MRI prior for active surveillance of small 1.5 cm right renal mass and solitary kidney  Michiel Cowboy, PA-C  06/06/2023  [facility] [address] [place], [place] [address] [phone]

## 2023-06-06 ENCOUNTER — Encounter: Payer: Self-pay | Admitting: Urology

## 2023-06-06 ENCOUNTER — Ambulatory Visit (INDEPENDENT_AMBULATORY_CARE_PROVIDER_SITE_OTHER): Payer: 59 | Admitting: Urology

## 2023-06-06 VITALS — BP 183/66 | HR 54 | Ht 60.0 in | Wt 95.0 lb

## 2023-06-06 DIAGNOSIS — Z905 Acquired absence of kidney: Secondary | ICD-10-CM | POA: Diagnosis not present

## 2023-06-06 DIAGNOSIS — N3281 Overactive bladder: Secondary | ICD-10-CM

## 2023-06-06 DIAGNOSIS — N3946 Mixed incontinence: Secondary | ICD-10-CM

## 2023-06-06 DIAGNOSIS — N3941 Urge incontinence: Secondary | ICD-10-CM | POA: Diagnosis not present

## 2023-06-06 DIAGNOSIS — N2889 Other specified disorders of kidney and ureter: Secondary | ICD-10-CM | POA: Diagnosis not present

## 2023-06-06 LAB — BLADDER SCAN AMB NON-IMAGING

## 2023-06-06 MED ORDER — GEMTESA 75 MG PO TABS
75.0000 mg | ORAL_TABLET | Freq: Every day | ORAL | 3 refills | Status: DC
Start: 2023-06-06 — End: 2023-10-12

## 2023-06-07 ENCOUNTER — Telehealth: Payer: Self-pay | Admitting: Urology

## 2023-06-07 NOTE — Telephone Encounter (Signed)
Pt went to pick up RX Candice Cook) and it was going to cost over $300.  She wants to know if she can try something else.

## 2023-06-11 ENCOUNTER — Other Ambulatory Visit: Payer: Self-pay | Admitting: Internal Medicine

## 2023-06-11 DIAGNOSIS — M109 Gout, unspecified: Secondary | ICD-10-CM

## 2023-06-14 ENCOUNTER — Other Ambulatory Visit: Payer: Self-pay | Admitting: Internal Medicine

## 2023-06-14 DIAGNOSIS — K219 Gastro-esophageal reflux disease without esophagitis: Secondary | ICD-10-CM

## 2023-07-22 ENCOUNTER — Telehealth: Payer: Self-pay | Admitting: Internal Medicine

## 2023-07-22 NOTE — Telephone Encounter (Signed)
Copied from CRM [id number]. Topic: Medicare AWV >> Jul 22, 2023 10:08 AM Payton Doughty wrote: Reason for CRM: Called 07/22/2023 to sched AWV - NO VOICEMAIL  Verlee Rossetti; Care Guide Ambulatory Clinical Support [place] [facility] Direct Dial: [phone]

## 2023-08-25 ENCOUNTER — Other Ambulatory Visit: Payer: Self-pay | Admitting: Internal Medicine

## 2023-08-25 DIAGNOSIS — E782 Mixed hyperlipidemia: Secondary | ICD-10-CM

## 2023-08-25 DIAGNOSIS — F5101 Primary insomnia: Secondary | ICD-10-CM

## 2023-09-06 ENCOUNTER — Ambulatory Visit: Payer: Medicare HMO | Admitting: Internal Medicine

## 2023-09-10 ENCOUNTER — Emergency Department
Admission: EM | Admit: 2023-09-10 | Discharge: 2023-09-10 | Disposition: A | Discharge status: Home / Self Care | Payer: Medicare HMO | Attending: Emergency Medicine | Admitting: Emergency Medicine

## 2023-09-10 ENCOUNTER — Other Ambulatory Visit: Payer: Self-pay

## 2023-09-10 DIAGNOSIS — I10 Essential (primary) hypertension: Secondary | ICD-10-CM | POA: Diagnosis not present

## 2023-09-10 DIAGNOSIS — R1084 Generalized abdominal pain: Secondary | ICD-10-CM | POA: Diagnosis not present

## 2023-09-10 DIAGNOSIS — B9689 Other specified bacterial agents as the cause of diseases classified elsewhere: Secondary | ICD-10-CM | POA: Diagnosis not present

## 2023-09-10 DIAGNOSIS — N390 Urinary tract infection, site not specified: Secondary | ICD-10-CM | POA: Insufficient documentation

## 2023-09-10 DIAGNOSIS — R109 Unspecified abdominal pain: Secondary | ICD-10-CM

## 2023-09-10 DIAGNOSIS — R1012 Left upper quadrant pain: Secondary | ICD-10-CM | POA: Diagnosis not present

## 2023-09-10 DIAGNOSIS — I959 Hypotension, unspecified: Secondary | ICD-10-CM | POA: Diagnosis not present

## 2023-09-10 DIAGNOSIS — R1111 Vomiting without nausea: Secondary | ICD-10-CM | POA: Diagnosis not present

## 2023-09-10 LAB — URINALYSIS, ROUTINE W REFLEX MICROSCOPIC
Bilirubin Urine: NEGATIVE
Glucose, UA: NEGATIVE mg/dL
Hgb urine dipstick: NEGATIVE
Ketones, ur: NEGATIVE mg/dL
Nitrite: NEGATIVE
Protein, ur: NEGATIVE mg/dL
Specific Gravity, Urine: 1.017 (ref 1.005–1.030)
pH: 5 (ref 5.0–8.0)

## 2023-09-10 LAB — COMPREHENSIVE METABOLIC PANEL
ALT: 11 U/L (ref 0–44)
AST: 18 U/L (ref 15–41)
Albumin: 3.6 g/dL (ref 3.5–5.0)
Alkaline Phosphatase: 51 U/L (ref 38–126)
Anion gap: 11 (ref 5–15)
BUN: 18 mg/dL (ref 8–23)
CO2: 28 mmol/L (ref 22–32)
Calcium: 8.5 mg/dL — ABNORMAL LOW (ref 8.9–10.3)
Chloride: 101 mmol/L (ref 98–111)
Creatinine, Ser: 0.84 mg/dL (ref 0.44–1.00)
GFR, Estimated: >60 mL/min (ref >60)
Glucose, Bld: 125 mg/dL — ABNORMAL HIGH (ref 70–99)
Potassium: 3.3 mmol/L — ABNORMAL LOW (ref 3.5–5.1)
Sodium: 140 mmol/L (ref 135–145)
Total Bilirubin: 0.7 mg/dL (ref 0.3–1.2)
Total Protein: 5.7 g/dL — ABNORMAL LOW (ref 6.5–8.1)

## 2023-09-10 LAB — CBC
HCT: 37.8 % (ref 36.0–46.0)
Hemoglobin: 12.2 g/dL (ref 12.0–15.0)
MCH: 29.6 pg (ref 26.0–34.0)
MCHC: 32.3 g/dL (ref 30.0–36.0)
MCV: 91.7 fL (ref 80.0–100.0)
Platelets: 146 10*3/uL — ABNORMAL LOW (ref 150–400)
RBC: 4.12 MIL/uL (ref 3.87–5.11)
RDW: 14.5 % (ref 11.5–15.5)
WBC: 12 10*3/uL — ABNORMAL HIGH (ref 4.0–10.5)
nRBC: 0 % (ref 0.0–0.2)

## 2023-09-10 LAB — TROPONIN I (HIGH SENSITIVITY): Troponin I (High Sensitivity): 10 ng/L (ref <18)

## 2023-09-10 LAB — LIPASE, BLOOD: Lipase: 37 U/L (ref 11–51)

## 2023-09-10 MED ORDER — SUCRALFATE 1 GM/10ML PO SUSP: 1.0000 g | Freq: Four times a day (QID) | ORAL | 0 refills | Status: AC

## 2023-09-10 MED ORDER — NITROFURANTOIN MONOHYD MACRO 100 MG PO CAPS: 100.0000 mg | ORAL_CAPSULE | Freq: Two times a day (BID) | ORAL | 0 refills | Status: AC

## 2023-09-10 NOTE — ED Triage Notes (Signed)
Pt comes with via EMS from home c/o upper quad pain. Pt states it was tight and she vomited. Pt states this started two hours after eating.

## 2023-09-10 NOTE — ED Provider Notes (Signed)
[facility] Provider Note    Event Date/Time   First MD Initiated Contact with Patient 09/10/23 1754     (approximate)   History   Abdominal Pain   HPI  Candice Cook is a 82 y.o. female  who presents to the emergency department today because of concern for abdominal pain. Started this afternoon. Located in the upper abdomen. Was accompanied by two episodes of non bloody vomiting.  The pain then started extending up into the patient's chest.  That is what prompted her to call 911.  The patient states she has had similar episodes in the past although has never been evaluated for them.  She does state that she has been under a lot of stress recently.  At the time my exam she states she is feeling better.  Denies any recent illness or fever.     Physical Exam   Triage Vital Signs: ED Triage Vitals  Encounter Vitals Group     BP 09/10/23 1524 (!) 138/49     Systolic BP Percentile --      Diastolic BP Percentile --      Pulse Rate 09/10/23 1524 64     Resp 09/10/23 1524 17     Temp 09/10/23 1524 98 F (36.7 C)     Temp Source 09/10/23 1524 Oral     SpO2 09/10/23 1524 98 %     Weight --      Height --      Head Circumference --      Peak Flow --      Pain Score 09/10/23 1503 8     Pain Loc --      Pain Education --      Exclude from Growth Chart --     Most recent vital signs: Vitals:   09/10/23 1524  BP: (!) 138/49  Pulse: 64  Resp: 17  Temp: 98 F (36.7 C)  SpO2: 98%   General: Awake, alert, oriented. CV:  Good peripheral perfusion. Regular rate and rhythm. Resp:  Normal effort. Lungs clear. Abd:  No distention.    ED Results / Procedures / Treatments   Labs (all labs ordered are listed, but only abnormal results are displayed) Labs Reviewed  COMPREHENSIVE METABOLIC PANEL - Abnormal; Notable for the following components:      Result Value   Potassium 3.3 (*)    Glucose, Bld 125 (*)    Calcium 8.5 (*)    Total Protein 5.7 (*)     All other components within normal limits  CBC - Abnormal; Notable for the following components:   WBC 12.0 (*)    Platelets 146 (*)    All other components within normal limits  URINALYSIS, ROUTINE W REFLEX MICROSCOPIC - Abnormal; Notable for the following components:   Color, Urine YELLOW (*)    APPearance HAZY (*)    Leukocytes,Ua SMALL (*)    Bacteria, UA MANY (*)    All other components within normal limits  LIPASE, BLOOD     EKG  I, Phineas Semen, attending physician, personally viewed and interpreted this EKG  EKG Time: 1836 Rate: 64 Rhythm: sinus rhythm Axis: normal Intervals: qtc 464 QRS: narrow, q waves III ST changes: no st elevation Impression: abnormal ekg   RADIOLOGY None   PROCEDURES:  Critical Care performed: No   MEDICATIONS ORDERED IN ED: Medications - No data to display   IMPRESSION / MDM / ASSESSMENT AND PLAN / ED COURSE  I  reviewed the triage vital signs and the nursing notes.                              Differential diagnosis includes, but is not limited to, ACS, GERD, pancreatitis, hepatitis.  Patient's presentation is most consistent with acute presentation with potential threat to life or bodily function.   The patient is on the cardiac monitor to evaluate for evidence of arrhythmia and/or significant heart rate changes.  Patient presented to the emergency department today because of concerns for upper abdominal and lower chest pain.  At the time my exam patient states she feels better.  Her abdomen is nontender on exam.  Blood work without any concerning electrolyte abnormalities.  White blood cell count of 12.  Patient is afebrile.  I did check a troponin which was negative.  EKG without concerning arrhythmia or ST elevation.  At this time given the patient's pain is resolved do not feel any emergent imaging is necessary.  I do wonder if patient is suffering more from GERD and esophagitis given reported history of significant  stress recently.  Will add on sucralfate to patient's medications.  Did discuss return precautions.      FINAL CLINICAL IMPRESSION(S) / ED DIAGNOSES   Final diagnoses:  Lower urinary tract infectious disease  Abdominal pain, unspecified abdominal location     Note:  This document was prepared using Dragon voice recognition software and may include unintentional dictation errors.    Phineas Semen, MD 09/10/23 2016

## 2023-09-10 NOTE — Discharge Instructions (Signed)
Please seek medical attention for any high fevers, chest pain, shortness of breath, change in behavior, persistent vomiting, bloody stool or any other new or concerning symptoms.

## 2023-09-11 NOTE — Progress Notes (Unsigned)
09/12/2023 1:30 PM   Candice Cook 07-28-1941 [id number]  Referring provider: Reubin Milan, MD [address] [place],  [place] [address]  Urological history: 1.  Right renal mass -CT (09/2022) - 1.5 cm right upper pole enhancing lesion  -MRI (03/2023) - stable enhancing right upper pole lesion with no change in size  -has appointment with Dr. Richardo Hanks in May 2025  2. Solitary Kidney -donated left kidney to daughter -serum creatinine (08/2023) - 0.84, eGFR > 60  3. Mixed incontience -contributing factors of age, GSM, chronic diarrhea, DM, dementia, lumbar DDD, cystocele, anxiety, depression and diuretics -Gemtesa 75 mg daily   Chief Complaint  Patient presents with   Urinary Incontinence   renal mass   HPI: Candice Cook is a 82 y.o. female who presents today for 3 month follow up.    Previous records reviewed.   She was seen at [facility] ED on 09/10/2023 for abdominal pain.  UA with pyuria and bacteriuria.  Serum creatinine 0.84, eGFR > 60, urine culture pending, started on Macrobid.    She having 1-7 daytime voids, nocturia x 1 with a strong urge to urinate.  She has stress incontinence.  She leaks 3 more times daily.  She wears depends daily.  She does not limit fluid intake.  She does engage in toilet mapping.    Patient denies any modifying or aggravating factors.  Patient denies any recent UTI's, gross hematuria, dysuria or suprapubic/flank pain.  Patient denies any fevers, chills, nausea or vomiting.    [name] is very helpful, but cost prohibitive.  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   Wears dentures    full upper and lower    Surgical History: Past Surgical History:  Procedure Laterality Date   ABDOMINAL HYSTERECTOMY     APPENDECTOMY  1993   BROW LIFT Bilateral 02/27/2019   Procedure: BLEPHAROPLASTY UPPER EYELID W/EXCESS SKIN;  Surgeon: Imagene Riches, MD;  Location: [facility];  Service: Ophthalmology;  Laterality: Bilateral;   BROW PTOSIS Bilateral 02/27/2019   Procedure: BROW PTOSIS REPAIR;  Surgeon: Imagene Riches, MD;  Location: [facility];  Service: Ophthalmology;  Laterality: Bilateral;  Diabetic - oral meds   CATARACT EXTRACTION W/PHACO Left 03/22/2017   Procedure: CATARACT EXTRACTION PHACO AND INTRAOCULAR LENS PLACEMENT (IOC) left diabetic;  Surgeon: Nevada Crane, MD;  Location: [facility];  Service: Ophthalmology;  Laterality: Left;  Diabetic   CATARACT EXTRACTION W/PHACO Right 05/17/2017   Procedure: CATARACT EXTRACTION PHACO AND INTRAOCULAR LENS PLACEMENT (IOC) Right diabetic;  Surgeon: Nevada Crane, MD;  Location: [facility];  Service: Ophthalmology;  Laterality: Right;   CHOLECYSTECTOMY     COLONOSCOPY WITH PROPOFOL N/A 11/14/2017   Procedure: COLONOSCOPY WITH PROPOFOL;  Surgeon: Christena Deem, MD;  Location: [facility] ENDOSCOPY;  Service: Endoscopy;  Laterality:  N/A;   EYE SURGERY     cataracts with extraocular prosthesis   KIDNEY SURGERY     gave daughter a kidney   NEPHRECTOMY Left 1993   donated to her daughter    Home Medications:  Allergies as of 09/12/2023       Reactions   Mirabegron Other (See Comments)   Severe tremor, headache   Metformin And Related Diarrhea        Medication List        Accurate as of September 12, 2023  1:30 PM. If you have any questions, ask your nurse or doctor.          allopurinol 100 MG tablet Commonly  known as: ZYLOPRIM TAKE 2 TABLETS BY MOUTH EVERY DAY   aspirin EC 81 MG tablet Take 81 mg by mouth daily.   docusate sodium 100 MG capsule Commonly known as: Colace Take 1 capsule (100 mg total) by mouth 2 (two) times daily.   gabapentin 300 MG capsule Commonly known as: NEURONTIN Take 3 capsules (900 mg total) by mouth at bedtime. Dr. [facility]   Gemtesa 75 MG Tabs Generic drug: Vibegron Take 1 tablet (75 mg total) by mouth daily.   lisinopril-hydrochlorothiazide 20-12.5 MG tablet Commonly known as: ZESTORETIC TAKE 1 TABLET BY MOUTH EVERY DAY   nitrofurantoin (macrocrystal-monohydrate) 100 MG capsule Commonly known as: Macrobid Take 1 capsule (100 mg total) by mouth 2 (two) times daily for 5 days.   omeprazole 40 MG capsule Commonly known as: PRILOSEC TAKE 1 CAPSULE (40 MG TOTAL) BY MOUTH DAILY.   sertraline 50 MG tablet Commonly known as: ZOLOFT Take 1 tablet (50 mg total) by mouth at bedtime.   simvastatin 20 MG tablet Commonly known as: ZOCOR TAKE 1 TABLET BY MOUTH EVERY DAY   sucralfate 1 GM/10ML suspension Commonly known as: Carafate Take 10 mLs (1 g total) by mouth 4 (four) times daily for 14 days.   traZODone 50 MG tablet Commonly known as: DESYREL TAKE 1 TABLET BY MOUTH EVERY DAY AT BEDTIME AS NEEDED FOR SLEEP        Allergies:  Allergies  Allergen Reactions   Mirabegron Other (See Comments)    Severe tremor, headache   Metformin And Related Diarrhea    Family History: Family History  Problem Relation Age of Onset   Heart disease Mother    Heart disease Father    Diabetes Father    Hypertension Father    Cancer Brother    Kidney disease Daughter    Bladder Cancer Neg Hx    Kidney cancer Neg Hx    Breast cancer Neg Hx     Social History:  reports that she has quit smoking. She has never used smokeless tobacco. She reports that she does not drink alcohol and does not use drugs.  ROS: Pertinent ROS in HPI  Physical  Exam: BP (!) 166/47   Pulse 74   Ht 5' (1.524 m)   Wt 91 lb (41.3 kg)   BMI 17.77 kg/m   Constitutional:  Well nourished. Alert and oriented, No acute distress. HEENT: [place] AT, moist mucus membranes.  Trachea midline, no masses. Cardiovascular: No clubbing, cyanosis, or edema. Respiratory: Normal respiratory effort, no increased work of breathing. Neurologic: Grossly intact, no focal deficits, moving all 4 extremities. Psychiatric: Normal mood and affect.    Laboratory Data: Lab Results  Component Value Date   WBC 12.0 (H) 09/10/2023   HGB 12.2 09/10/2023   HCT 37.8 09/10/2023  MCV 91.7 09/10/2023   PLT 146 (L) 09/10/2023    Lab Results  Component Value Date   CREATININE 0.84 09/10/2023    Lab Results  Component Value Date   AST 18 09/10/2023   Lab Results  Component Value Date   ALT 11 09/10/2023    Urinalysis    Component Value Date/Time   COLORURINE YELLOW (A) 09/10/2023 1523   APPEARANCEUR HAZY (A) 09/10/2023 1523   APPEARANCEUR Clear 03/23/2016 0855   LABSPEC 1.017 09/10/2023 1523   LABSPEC 1.019 05/25/2012 0500   PHURINE 5.0 09/10/2023 1523   GLUCOSEU NEGATIVE 09/10/2023 1523   GLUCOSEU Negative 05/25/2012 0500   HGBUR NEGATIVE 09/10/2023 1523   BILIRUBINUR NEGATIVE 09/10/2023 1523   BILIRUBINUR Negative 03/23/2016 0855   BILIRUBINUR Negative 05/25/2012 0500   KETONESUR NEGATIVE 09/10/2023 1523   PROTEINUR NEGATIVE 09/10/2023 1523   NITRITE NEGATIVE 09/10/2023 1523   LEUKOCYTESUR SMALL (A) 09/10/2023 1523   LEUKOCYTESUR Negative 05/25/2012 0500  I have reviewed the labs.   Pertinent Imaging:  09/12/23 13:07  Scan Result 0 ml    Assessment & Plan:    1. Mixed Incontinence -At goal with [name] -Discussed trying another OAB agent, but she was uncomfortable with side effect profile -I gave her Gemtesa samples today and she will take them every other day to try and stretch them out   2. Solitary kidney -renal function normal  3. Renal  Mass -follow up with Dr. Richardo Hanks in May  Return for keep follow up in May with Dr. Richardo Hanks .  These notes generated with voice recognition software. I apologize for typographical errors.  Cloretta Ned  [facility] [address]  [address] [place], [place] [address] [phone]

## 2023-09-12 ENCOUNTER — Ambulatory Visit (INDEPENDENT_AMBULATORY_CARE_PROVIDER_SITE_OTHER): Payer: Medicare HMO | Admitting: Urology

## 2023-09-12 ENCOUNTER — Encounter: Payer: Self-pay | Admitting: Urology

## 2023-09-12 VITALS — BP 166/47 | HR 74 | Ht 60.0 in | Wt 91.0 lb

## 2023-09-12 DIAGNOSIS — N3946 Mixed incontinence: Secondary | ICD-10-CM

## 2023-09-12 DIAGNOSIS — Z905 Acquired absence of kidney: Secondary | ICD-10-CM

## 2023-09-12 DIAGNOSIS — N2889 Other specified disorders of kidney and ureter: Secondary | ICD-10-CM

## 2023-09-12 LAB — BLADDER SCAN AMB NON-IMAGING: Scan Result: 0

## 2023-09-13 ENCOUNTER — Other Ambulatory Visit: Payer: Self-pay | Admitting: Internal Medicine

## 2023-09-13 DIAGNOSIS — M109 Gout, unspecified: Secondary | ICD-10-CM

## 2023-09-13 LAB — URINE CULTURE: Culture: >=100000 — AB

## 2023-10-12 ENCOUNTER — Ambulatory Visit (INDEPENDENT_AMBULATORY_CARE_PROVIDER_SITE_OTHER): Payer: Medicare HMO | Admitting: Internal Medicine

## 2023-10-12 ENCOUNTER — Encounter: Payer: Self-pay | Admitting: Internal Medicine

## 2023-10-12 VITALS — BP 112/70 | HR 59 | Ht 60.0 in | Wt 90.0 lb

## 2023-10-12 DIAGNOSIS — N182 Chronic kidney disease, stage 2 (mild): Secondary | ICD-10-CM

## 2023-10-12 DIAGNOSIS — Z23 Encounter for immunization: Secondary | ICD-10-CM

## 2023-10-12 DIAGNOSIS — G309 Alzheimer's disease, unspecified: Secondary | ICD-10-CM

## 2023-10-12 DIAGNOSIS — F0280 Dementia in other diseases classified elsewhere without behavioral disturbance: Secondary | ICD-10-CM | POA: Diagnosis not present

## 2023-10-12 DIAGNOSIS — Z8639 Personal history of other endocrine, nutritional and metabolic disease: Secondary | ICD-10-CM | POA: Diagnosis not present

## 2023-10-12 DIAGNOSIS — D3501 Benign neoplasm of right adrenal gland: Secondary | ICD-10-CM

## 2023-10-12 DIAGNOSIS — N2889 Other specified disorders of kidney and ureter: Secondary | ICD-10-CM | POA: Diagnosis not present

## 2023-10-12 DIAGNOSIS — E782 Mixed hyperlipidemia: Secondary | ICD-10-CM

## 2023-10-12 DIAGNOSIS — I10 Essential (primary) hypertension: Secondary | ICD-10-CM | POA: Diagnosis not present

## 2023-10-12 NOTE — Progress Notes (Signed)
Date:  10/12/2023   Name:  Candice Cook   DOB:  Jun 25, 1941   MRN:  [id number]   Chief Complaint: Hypertension  Hypertension This is a chronic problem. The problem is controlled. Pertinent negatives include no chest pain, headaches, palpitations or shortness of breath. Past treatments include ACE inhibitors and diuretics. The current treatment provides significant improvement. There is no history of kidney disease or CVA. Identifiable causes of hypertension include a thyroid problem.  Hyperlipidemia This is a chronic problem. The problem is controlled. Pertinent negatives include no chest pain or shortness of breath. Current antihyperlipidemic treatment includes statins. The current treatment provides significant improvement of lipids.  Thyroid Problem Presents for follow-up (no supplements in many years) visit. Patient reports no anxiety, fatigue or palpitations. The symptoms have been resolved. Her past medical history is significant for hyperlipidemia.    Review of Systems  Constitutional:  Positive for unexpected weight change (has lost a few pounds unintentionally). Negative for appetite change, chills, fatigue and fever.  HENT:  Negative for trouble swallowing.   Respiratory:  Negative for cough, chest tightness and shortness of breath.   Cardiovascular:  Negative for chest pain and palpitations.  Gastrointestinal:  Negative for abdominal pain.  Neurological:  Positive for light-headedness. Negative for dizziness and headaches.  Psychiatric/Behavioral:  Negative for dysphoric mood and sleep disturbance. The patient is not nervous/anxious.      Lab Results  Component Value Date   NA 140 09/10/2023   K 3.3 (L) 09/10/2023   CO2 28 09/10/2023   GLUCOSE 125 (H) 09/10/2023   BUN 18 09/10/2023   CREATININE 0.84 09/10/2023   CALCIUM 8.5 (L) 09/10/2023   EGFR 83 04/08/2023   GFRNONAA >60 09/10/2023   Lab Results  Component Value Date   CHOL 140 01/15/2022   HDL 47 01/15/2022    LDLCALC 58 01/15/2022   TRIG 217 (H) 01/15/2022   CHOLHDL 3.0 01/15/2022   No results found for: "TSH" Lab Results  Component Value Date   HGBA1C 5.8 (H) 05/17/2022   Lab Results  Component Value Date   WBC 12.0 (H) 09/10/2023   HGB 12.2 09/10/2023   HCT 37.8 09/10/2023   MCV 91.7 09/10/2023   PLT 146 (L) 09/10/2023   Lab Results  Component Value Date   ALT 11 09/10/2023   AST 18 09/10/2023   ALKPHOS 51 09/10/2023   BILITOT 0.7 09/10/2023   Lab Results  Component Value Date   VD25OH 44.7 05/17/2022     Patient Active Problem List   Diagnosis Date Noted   Adrenal adenoma, right 10/12/2023   Right renal mass 04/08/2023   Gouty arthritis of both feet 01/15/2022   Mixed hyperlipidemia 01/15/2022   Gait abnormality 01/15/2022   Foraminal stenosis of lumbar region 04/21/2020   Lumbar degenerative disc disease 03/20/2020   Osteoporosis, 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   Pessary maintenance 01/11/2018   Vaginal polyp 12/12/2017   Vaginal atrophy 12/12/2017   Cystocele, midline 12/12/2017   Chronic diarrhea 09/15/2017   Chronic renal impairment, 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   Alzheimer's dementia without behavioral disturbance (HCC) 09/30/2016   Mixed incontinence 03/23/2016   B12 deficiency 02/03/2016   High risk medication use 01/15/2016   Primary insomnia 01/15/2016   Vitamin D deficiency, unspecified 01/15/2016   Microalbuminuria 09/23/2015   GERD without esophagitis 05/02/2015   History of cervical cancer 05/02/2015   History of hypothyroidism 05/02/2015   Urge incontinence 05/02/2015   Prediabetes 09/20/2014   Constipation by delayed colonic transit  08/30/2013   Rectocele 08/30/2013   Essential hypertension 07/02/2013    Allergies  Allergen Reactions   Mirabegron Other (See Comments)    Severe tremor, headache   Metformin And Related Diarrhea    Past Surgical History:  Procedure Laterality Date   ABDOMINAL HYSTERECTOMY     APPENDECTOMY  1993   BROW LIFT  Bilateral 02/27/2019   Procedure: BLEPHAROPLASTY UPPER EYELID W/EXCESS SKIN;  Surgeon: Imagene Riches, MD;  Location: [facility];  Service: Ophthalmology;  Laterality: Bilateral;   BROW PTOSIS Bilateral 02/27/2019   Procedure: BROW PTOSIS REPAIR;  Surgeon: Imagene Riches, MD;  Location: [facility];  Service: Ophthalmology;  Laterality: Bilateral;  Diabetic - oral meds   CATARACT EXTRACTION W/PHACO Left 03/22/2017   Procedure: CATARACT EXTRACTION PHACO AND INTRAOCULAR LENS PLACEMENT (IOC) left diabetic;  Surgeon: Nevada Crane, MD;  Location: [facility];  Service: Ophthalmology;  Laterality: Left;  Diabetic   CATARACT EXTRACTION W/PHACO Right 05/17/2017   Procedure: CATARACT EXTRACTION PHACO AND INTRAOCULAR LENS PLACEMENT (IOC) Right diabetic;  Surgeon: Nevada Crane, MD;  [facility];  Service: Ophthalmology;  Laterality: Right;   CHOLECYSTECTOMY     COLONOSCOPY WITH PROPOFOL N/A 11/14/2017   Procedure: COLONOSCOPY WITH PROPOFOL;  Surgeon: Christena Deem, MD;  Location: [facility] ENDOSCOPY;  Service: Endoscopy;  Laterality: N/A;   EYE SURGERY     cataracts with extraocular prosthesis   KIDNEY SURGERY     gave daughter a kidney   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  Current Meds  Medication Sig   allopurinol (ZYLOPRIM) 100 MG tablet TAKE 2 TABLETS BY MOUTH EVERY DAY   aspirin EC 81 MG tablet Take 81 mg by mouth daily.   docusate sodium (COLACE) 100 MG capsule Take 1 capsule (100 mg total) by mouth 2 (two) times daily.   gabapentin (NEURONTIN) 300 MG capsule Take 3 capsules (900 mg total) by mouth at bedtime.  Dr. [facility]   lisinopril-hydrochlorothiazide (ZESTORETIC) 20-12.5 MG tablet TAKE 1 TABLET BY MOUTH EVERY DAY   omeprazole (PRILOSEC) 40 MG capsule TAKE 1 CAPSULE (40 MG TOTAL) BY MOUTH DAILY.   sertraline (ZOLOFT) 50 MG tablet TAKE 1 TABLET BY MOUTH EVERYDAY AT BEDTIME   simvastatin (ZOCOR) 20 MG tablet TAKE 1 TABLET BY MOUTH EVERY DAY   traZODone (DESYREL) 50 MG tablet TAKE 1 TABLET BY MOUTH EVERY DAY AT BEDTIME AS NEEDED FOR SLEEP   [DISCONTINUED] Vibegron (GEMTESA) 75 MG TABS Take 1 tablet (75 mg total) by mouth daily.       10/12/2023    1:29 PM 04/08/2023   10:00 AM 11/29/2022    9:50 AM 10/27/2022    1:33 PM  GAD 7 : Generalized Anxiety Score  Nervous, Anxious, on Edge 0 0 3 2  Control/stop worrying 0 0 3 3  Worry too much - different things 0 0 3 3  Trouble relaxing 0 0 3 3  Restless 0 0 3 1  Easily annoyed or irritable 0 0 3 2  Afraid - awful might  happen 0 0 0 1  Total GAD 7 Score 0 0 18 15  Anxiety Difficulty Not difficult at all Not difficult at all Not difficult at all Very difficult       10/12/2023    1:29 PM 04/08/2023    9:59 AM 11/29/2022    9:50 AM  Depression screen PHQ 2/9  Decreased Interest 0 0 1  Down, Depressed, Hopeless 0 0 1  PHQ - 2 Score 0 0 2  Altered sleeping 0 0 3  Tired, decreased energy 0 0 3  Change in appetite 0 0 3  Feeling bad or failure about yourself  0 0 0  Trouble concentrating 0 0 1  Moving slowly or fidgety/restless 0 0 0  Suicidal thoughts 0 0 0  PHQ-9 Score 0 0 12  Difficult doing work/chores Not difficult at all Not difficult at all Not difficult at all    BP Readings from Last 3 Encounters:  10/12/23 112/70  09/12/23 (!) 166/47  09/10/23 136/77    Physical Exam Vitals and nursing note reviewed.  Constitutional:      General: She is not in acute distress.    Appearance: Normal appearance. She is well-developed.  HENT:     Head: Normocephalic and atraumatic.  Neck:     Vascular: No carotid bruit.   Cardiovascular:     Rate and Rhythm: Normal rate and regular rhythm.  Pulmonary:     Effort: Pulmonary effort is normal. No respiratory distress.     Breath sounds: No wheezing or rhonchi.  Musculoskeletal:     Cervical back: Normal range of motion.     Right lower leg: No edema.     Left lower leg: No edema.  Lymphadenopathy:     Cervical: No cervical adenopathy.  Skin:    General: Skin is warm and dry.     Findings: No rash.  Neurological:     General: No focal deficit present.     Mental Status: She is alert and oriented to person, place, and time.  Psychiatric:        Mood and Affect: Mood normal.        Behavior: Behavior normal.     Wt Readings from Last 3 Encounters:  10/12/23 90 lb (40.8 kg)  09/12/23 91 lb (41.3 kg)  06/06/23 95 lb (43.1 kg)    BP 112/70   Pulse (!) 59   Ht 5' (1.524 m)   Wt 90 lb (40.8 kg)   SpO2 98%   BMI 17.58 kg/m   Assessment and Plan:  Problem List Items Addressed This Visit       Unprioritized   Adrenal adenoma, right   Alzheimer's dementia without behavioral disturbance (HCC) (Chronic)    Alert and Ox3 today Mood is stable and she appears to be doing well Continue sertraline and trazodone.      Chronic renal impairment, stage 2 (mild) (Chronic)    Most recent GFR was normal. Lab Results  Component Value Date   NA 140 09/10/2023   CL 101 09/10/2023   K 3.3 (L) 09/10/2023   CO2 28 09/10/2023   BUN 18 09/10/2023   CREATININE 0.84 09/10/2023   GFRNONAA >60 09/10/2023   CALCIUM 8.5 (L) 09/10/2023   ALBUMIN 3.6 09/10/2023   GLUCOSE 125 (H) 09/10/2023         Essential hypertension - Primary (Chronic)    Normal exam with stable BP on lisinopril and hctz. No concerns or side effects to current medication.  No change in regimen; continue low sodium diet.       History of hypothyroidism    Would repeat TSH for monitoring purposes.      Relevant Orders   TSH   Mixed hyperlipidemia    LDL is  Lab Results   Component Value Date   LDLCALC 58 01/15/2022  Currently being treated with simvastatin with good compliance and no concerns.       Relevant Orders   Lipid panel   Right renal mass   Other Visit Diagnoses     Need for influenza vaccination       Relevant Orders   Flu Vaccine Trivalent High Dose (Fluad) (Completed)       Return in about 4 months (around 02/12/2024) for CPX.    Reubin Milan, MD [facility] [place]

## 2023-10-12 NOTE — Assessment & Plan Note (Signed)
Would repeat TSH for monitoring purposes.

## 2023-10-12 NOTE — Assessment & Plan Note (Signed)
LDL is  Lab Results  Component Value Date   LDLCALC 58 01/15/2022  Currently being treated with simvastatin with good compliance and no concerns.

## 2023-10-12 NOTE — Assessment & Plan Note (Signed)
Normal exam with stable BP on lisinopril and hctz. No concerns or side effects to current medication. No change in regimen; continue low sodium diet.

## 2023-10-12 NOTE — Assessment & Plan Note (Signed)
Alert and Ox3 today Mood is stable and she appears to be doing well Continue sertraline and trazodone.

## 2023-10-12 NOTE — Assessment & Plan Note (Signed)
Most recent GFR was normal. Lab Results  Component Value Date   NA 140 09/10/2023   CL 101 09/10/2023   K 3.3 (L) 09/10/2023   CO2 28 09/10/2023   BUN 18 09/10/2023   CREATININE 0.84 09/10/2023   GFRNONAA >60 09/10/2023   CALCIUM 8.5 (L) 09/10/2023   ALBUMIN 3.6 09/10/2023   GLUCOSE 125 (H) 09/10/2023

## 2023-10-13 LAB — TSH: TSH: 3.46 u[IU]/mL (ref 0.450–4.500)

## 2023-10-13 LAB — LIPID PANEL
Chol/HDL Ratio: 2.3 {ratio} (ref 0.0–4.4)
Cholesterol, Total: 122 mg/dL (ref 100–199)
HDL: 52 mg/dL (ref >39)
LDL Chol Calc (NIH): 52 mg/dL (ref 0–99)
Triglycerides: 92 mg/dL (ref 0–149)
VLDL Cholesterol Cal: 18 mg/dL (ref 5–40)

## 2023-10-14 NOTE — Progress Notes (Signed)
Called pt could not could not leave VM.  KP

## 2023-10-17 NOTE — Progress Notes (Signed)
3rd attempt labs printed and mailed.  KP 

## 2023-12-06 ENCOUNTER — Other Ambulatory Visit: Payer: Self-pay | Admitting: Internal Medicine

## 2023-12-06 DIAGNOSIS — K219 Gastro-esophageal reflux disease without esophagitis: Secondary | ICD-10-CM

## 2023-12-07 NOTE — Telephone Encounter (Signed)
Requested Prescriptions  Pending Prescriptions Disp Refills   omeprazole (PRILOSEC) 40 MG capsule [Pharmacy Med Name: OMEPRAZOLE DR 40 MG CAPSULE] 90 capsule 1    Sig: TAKE 1 CAPSULE (40 MG TOTAL) BY MOUTH DAILY.     Gastroenterology: Proton Pump Inhibitors Passed - 12/06/2023  1:42 AM      Passed - Valid encounter within last 12 months    Recent Outpatient Visits           1 month ago Essential hypertension   [place] [facility] at [facility], Nyoka Cowden, MD   8 months ago Essential hypertension   [place] [facility] at [facility], Nyoka Cowden, MD   1 year ago Essential hypertension   [place] [facility] at [facility], Nyoka Cowden, MD   1 year ago Essential hypertension   [place] [facility] at [facility], Nyoka Cowden, MD   1 year ago Essential hypertension   [facility] at [facility], Nyoka Cowden, MD       Future Appointments             In 3 months Judithann Graves, Nyoka Cowden, MD [facility] at [facility]   In 5 months Richardo Hanks, Laurette Schimke, MD [facility] [place]

## 2024-03-07 ENCOUNTER — Encounter: Payer: Medicare HMO | Admitting: Internal Medicine

## 2024-03-07 NOTE — Progress Notes (Deleted)
 Date:  03/07/2024   Name:  Candice Cook   DOB:  23-Feb-1941   MRN:  [id number]   Chief Complaint: No chief complaint on file.  HPI  Review of Systems   Lab Results  Component Value Date   NA 140 09/10/2023   K 3.3 (L) 09/10/2023   CO2 28 09/10/2023   GLUCOSE 125 (H) 09/10/2023   BUN 18 09/10/2023   CREATININE 0.84 09/10/2023   CALCIUM 8.5 (L) 09/10/2023   EGFR 83 04/08/2023   GFRNONAA >60 09/10/2023   Lab Results  Component Value Date   CHOL 122 10/12/2023   HDL 52 10/12/2023   LDLCALC 52 10/12/2023   TRIG 92 10/12/2023   CHOLHDL 2.3 10/12/2023   Lab Results  Component Value Date   TSH 3.460 10/12/2023   Lab Results  Component Value Date   HGBA1C 5.8 (H) 05/17/2022   Lab Results  Component Value Date   WBC 12.0 (H) 09/10/2023   HGB 12.2 09/10/2023   HCT 37.8 09/10/2023   MCV 91.7 09/10/2023   PLT 146 (L) 09/10/2023   Lab Results  Component Value Date   ALT 11 09/10/2023   AST 18 09/10/2023   ALKPHOS 51 09/10/2023   BILITOT 0.7 09/10/2023   Lab Results  Component Value Date   VD25OH 44.7 05/17/2022     Patient Active Problem List   Diagnosis Date Noted   Adrenal adenoma, right 10/12/2023   Right renal mass 04/08/2023   Gouty arthritis of both feet 01/15/2022   Mixed hyperlipidemia 01/15/2022   Gait abnormality 01/15/2022   Foraminal stenosis of lumbar region 04/21/2020   Lumbar degenerative disc disease 03/20/2020   Osteoporosis, 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   Pessary maintenance 01/11/2018   Vaginal polyp 12/12/2017   Vaginal atrophy 12/12/2017   Cystocele, midline 12/12/2017   Chronic diarrhea 09/15/2017   Chronic renal impairment, 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   Alzheimer's dementia without behavioral disturbance (HCC) 09/30/2016   Mixed incontinence 03/23/2016   B12 deficiency 02/03/2016   High risk medication use 01/15/2016   Primary insomnia 01/15/2016   Vitamin D deficiency, unspecified 01/15/2016   Microalbuminuria 09/23/2015    GERD without esophagitis 05/02/2015   History of cervical cancer 05/02/2015   History of hypothyroidism 05/02/2015   Urge incontinence 05/02/2015   Prediabetes 09/20/2014   Constipation by delayed colonic transit 08/30/2013   Rectocele 08/30/2013   Essential hypertension 07/02/2013    Allergies  Allergen Reactions   Mirabegron Other (See Comments)    Severe tremor, headache   Metformin And Related Diarrhea    Past Surgical History:  Procedure Laterality Date   ABDOMINAL HYSTERECTOMY     APPENDECTOMY  1993   BROW LIFT Bilateral 02/27/2019   Procedure: BLEPHAROPLASTY UPPER EYELID W/EXCESS SKIN;  Surgeon: Imagene Riches, MD;  Location: [facility];  Service: Ophthalmology;  Laterality: Bilateral;   BROW PTOSIS Bilateral 02/27/2019   Procedure: BROW PTOSIS REPAIR;  Surgeon: Imagene Riches, MD;  Location: [facility];  Service: Ophthalmology;  Laterality: Bilateral;  Diabetic - oral meds   CATARACT EXTRACTION W/PHACO Left 03/22/2017   Procedure: CATARACT EXTRACTION PHACO AND INTRAOCULAR LENS PLACEMENT (IOC) left diabetic;  Surgeon: Nevada Crane, MD;  Location: [facility];  Service: Ophthalmology;  Laterality: Left;  Diabetic   CATARACT EXTRACTION W/PHACO Right 05/17/2017   Procedure: CATARACT EXTRACTION PHACO AND INTRAOCULAR LENS PLACEMENT (IOC) Right diabetic;  Surgeon: Nevada Crane, MD;  Location: [facility];  Service: Ophthalmology;  Laterality:  Right;   CHOLECYSTECTOMY     COLONOSCOPY WITH PROPOFOL N/A 11/14/2017   Procedure: COLONOSCOPY WITH PROPOFOL;  Surgeon: Christena Deem, MD;  Location: [facility] ENDOSCOPY;  Service: Endoscopy;  Laterality: N/A;   EYE SURGERY     cataracts with extraocular prosthesis   KIDNEY SURGERY     gave daughter a kidney   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  No outpatient medications have been marked as taking for the 03/07/24 encounter (Appointment) with Reubin Milan, MD.       10/12/2023    1:29 PM 04/08/2023   10:00 AM 11/29/2022    9:50 AM 10/27/2022    1:33 PM  GAD 7 : Generalized Anxiety Score  Nervous, Anxious, on Edge 0 0 3 2  Control/stop worrying 0 0 3 3  Worry too much - different things 0 0 3 3  Trouble relaxing 0 0 3 3  Restless 0 0 3 1  Easily annoyed or irritable 0 0 3 2  Afraid - awful might happen 0 0 0 1  Total GAD 7 Score 0 0 18 15  Anxiety Difficulty Not difficult at all Not difficult at all Not difficult at all Very difficult       10/12/2023    1:29 PM 04/08/2023    9:59 AM 11/29/2022    9:50 AM  Depression screen PHQ 2/9  Decreased Interest 0 0 1  Down, Depressed, Hopeless 0 0 1  PHQ - 2 Score 0 0 2  Altered sleeping 0 0 3  Tired, decreased energy 0 0 3  Change in appetite 0 0 3  Feeling bad or failure about yourself  0 0 0  Trouble concentrating 0 0 1  Moving slowly or fidgety/restless 0 0 0  Suicidal thoughts 0 0 0  PHQ-9 Score 0 0 12  Difficult doing work/chores Not difficult at all Not difficult at all Not difficult at all    BP Readings from Last 3 Encounters:  10/12/23 112/70  09/12/23 (!) 166/47  09/10/23 136/77    Physical Exam  Wt Readings from Last 3 Encounters:  10/12/23 90 lb (40.8 kg)  09/12/23 91 lb (41.3 kg)  06/06/23 95 lb (43.1 kg)    There were no vitals taken for this visit.  Assessment and Plan:  Problem List Items Addressed This Visit   None   No follow-ups on file.    Reubin Milan, MD [facility] [place]

## 2024-03-09 ENCOUNTER — Other Ambulatory Visit: Payer: Self-pay | Admitting: Internal Medicine

## 2024-03-09 DIAGNOSIS — M51369 Other intervertebral disc degeneration, lumbar region without mention of lumbar back pain or lower extremity pain: Secondary | ICD-10-CM

## 2024-03-09 DIAGNOSIS — F5101 Primary insomnia: Secondary | ICD-10-CM

## 2024-03-09 DIAGNOSIS — M109 Gout, unspecified: Secondary | ICD-10-CM

## 2024-03-09 DIAGNOSIS — I10 Essential (primary) hypertension: Secondary | ICD-10-CM

## 2024-03-09 DIAGNOSIS — E782 Mixed hyperlipidemia: Secondary | ICD-10-CM

## 2024-03-09 DIAGNOSIS — K219 Gastro-esophageal reflux disease without esophagitis: Secondary | ICD-10-CM

## 2024-03-09 NOTE — Telephone Encounter (Signed)
 Copied from CRM [id number]. Topic: Clinical - Medication Refill >> Mar 09, 2024 12:12 PM Gildardo Pounds wrote: Most Recent Primary Care Visit:  Provider: Reubin Milan  Department: [facility] [place]  Visit Type: OFFICE VISIT  Date: 10/12/2023  Medication: sertraline (ZOLOFT) 50 MG tablet traZODone (DESYREL) 50 MG tablet lisinopril-hydrochlorothiazide (ZESTORETIC) 20-12.5 MG tablet simvastatin (ZOCOR) 20 MG tablet allopurinol (ZYLOPRIM) 100 MG tablet gabapentin (NEURONTIN) 300 MG capsule omeprazole (PRILOSEC) 40 MG capsule  Has the patient contacted their pharmacy? Yes (Agent: If no, request that the patient contact the pharmacy for the refill. If patient does not wish to contact the pharmacy document the reason why and proceed with request.) (Agent: If yes, when and what did the pharmacy advise?)  Is this the correct pharmacy for this prescription? Yes If no, delete pharmacy and type the correct one.  This is the patient's preferred pharmacy:  [facility] [phone] Dan [place], [place] - [address] [place] [address] Phone: [phone] Fax: [phone]   Has the prescription been filled recently? No  Is the patient out of the medication? No  Has the patient been seen for an appointment in the last year OR does the patient have an upcoming appointment? Yes  Can we respond through MyChart? No  Agent: Please be advised that Rx refills may take up to 3 business days. We ask that you follow-up with your pharmacy.

## 2024-03-12 MED ORDER — SERTRALINE HCL 50 MG PO TABS: 50.0000 mg | ORAL_TABLET | Freq: Every day | ORAL | 0 refills | Status: AC

## 2024-03-12 MED ORDER — SIMVASTATIN 20 MG PO TABS: 20.0000 mg | ORAL_TABLET | Freq: Every day | ORAL | 0 refills | Status: AC

## 2024-03-12 MED ORDER — ALLOPURINOL 100 MG PO TABS: 200.0000 mg | ORAL_TABLET | Freq: Every day | ORAL | 0 refills | Status: AC

## 2024-03-12 MED ORDER — GABAPENTIN 300 MG PO CAPS: 900.0000 mg | ORAL_CAPSULE | Freq: Every day | ORAL | 0 refills | Status: AC

## 2024-03-12 MED ORDER — TRAZODONE HCL 50 MG PO TABS: 50.0000 mg | ORAL_TABLET | Freq: Every evening | ORAL | 0 refills | Status: AC | PRN

## 2024-03-12 MED ORDER — LISINOPRIL-HYDROCHLOROTHIAZIDE 20-12.5 MG PO TABS: 1.0000 | ORAL_TABLET | Freq: Every day | ORAL | 0 refills | Status: AC

## 2024-03-12 NOTE — Telephone Encounter (Signed)
 Needs to keep appointment 03/23/24 Courtesy RF given- 1 month supply Requested Prescriptions  Pending Prescriptions Disp Refills   allopurinol (ZYLOPRIM) 100 MG tablet 60 tablet 0    Sig: Take 2 tablets (200 mg total) by mouth daily.     Endocrinology:  Gout Agents - allopurinol Failed - 03/12/2024  9:07 AM      Failed - Uric Acid in normal range and within 360 days    Uric Acid  Date Value Ref Range Status  05/17/2022 4.0 3.1 - 7.9 mg/dL Final    Comment:               Therapeutic target for gout patients: <6.0         Passed - Cr in normal range and within 360 days    Creatinine  Date Value Ref Range Status  02/11/2014 0.98 0.60 - 1.30 mg/dL Final   Creatinine, Ser  Date Value Ref Range Status  09/10/2023 0.84 0.44 - 1.00 mg/dL Final         Passed - Valid encounter within last 12 months    Recent Outpatient Visits           5 months ago Essential hypertension   [place] [facility] at [facility], Nyoka Cowden, MD   11 months ago Essential hypertension   [place] [facility] at [facility], Nyoka Cowden, MD   1 year ago Essential hypertension   [place] [facility] at [facility], Nyoka Cowden, MD   1 year ago Essential hypertension   [place] [facility] at [facility], Nyoka Cowden, MD   1 year ago Essential hypertension    Primary [facility] at [facility], Nyoka Cowden, MD       Future Appointments             In 1 month Richardo Hanks, Laurette Schimke, MD [facility] [place]            Passed - CBC within normal limits and completed in the last 12 months    WBC  Date Value Ref Range Status  09/10/2023 12.0 (H) 4.0 - 10.5 K/uL Final   RBC  Date Value Ref Range Status  09/10/2023 4.12 3.87 - 5.11 MIL/uL Final   Hemoglobin  Date Value Ref Range Status  09/10/2023 12.2 12.0 - 15.0 g/dL Final  25/95/6387  56.4 11.1 - 15.9 g/dL Final   HCT  Date Value Ref Range Status  09/10/2023 37.8 36.0 - 46.0 % Final   Hematocrit  Date Value Ref Range Status  01/15/2022 36.2 34.0 - 46.6 % Final   MCHC  Date Value Ref Range Status  09/10/2023 32.3 30.0 - 36.0 g/dL Final   [facility]  Date Value Ref Range Status  09/10/2023 29.6 26.0 - 34.0 pg Final   MCV  Date Value Ref Range Status  09/10/2023 91.7 80.0 - 100.0 fL Final  01/15/2022 90 79 - 97 fL Final  02/11/2014 88 80 - 100 fL Final   No results found for: "PLTCOUNTKUC", "LABPLAT", "POCPLA" RDW  Date Value Ref Range Status  09/10/2023 14.5 11.5 - 15.5 % Final  01/15/2022 13.5 11.7 - 15.4 % Final  02/11/2014 14.7 (H) 11.5 - 14.5 % Final          gabapentin (NEURONTIN) 300 MG capsule 90 capsule 0    Sig: Take 3 capsules (900 mg  total) by mouth at bedtime. Dr. [facility]     Neurology: Anticonvulsants - gabapentin Passed - 03/12/2024  9:07 AM      Passed - Cr in normal range and within 360 days    Creatinine  Date Value Ref Range Status  02/11/2014 0.98 0.60 - 1.30 mg/dL Final   Creatinine, Ser  Date Value Ref Range Status  09/10/2023 0.84 0.44 - 1.00 mg/dL Final         Passed - Completed PHQ-2 or PHQ-9 in the last 360 days      Passed - Valid encounter within last 12 months    Recent Outpatient Visits           5 months ago Essential hypertension   [place] [facility] at [facility], Nyoka Cowden, MD   11 months ago Essential hypertension   [place] [facility] at [facility], Nyoka Cowden, MD   1 year ago Essential hypertension   [place] [facility] at [facility], Nyoka Cowden, MD   1 year ago Essential hypertension   [place] [facility] at [facility], Nyoka Cowden, MD   1 year ago Essential hypertension   [place] [facility] at [facility], Nyoka Cowden,  MD       Future Appointments             In 1 month Richardo Hanks Laurette Schimke, MD [facility] [place]             lisinopril-hydrochlorothiazide (ZESTORETIC) 20-12.5 MG tablet 30 tablet 0    Sig: Take 1 tablet by mouth daily.     Cardiovascular:  ACEI + Diuretic Combos Failed - 03/12/2024  9:07 AM      Failed - Na in normal range and within 180 days    Sodium  Date Value Ref Range Status  09/10/2023 140 135 - 145 mmol/L Final  04/08/2023 146 (H) 134 - 144 mmol/L Final  02/11/2014 135 (L) 136 - 145 mmol/L Final         Failed - K in normal range and within 180 days    Potassium  Date Value Ref Range Status  09/10/2023 3.3 (L) 3.5 - 5.1 mmol/L Final  02/11/2014 3.3 (L) 3.5 - 5.1 mmol/L Final         Failed - Cr in normal range and within 180 days    Creatinine  Date Value Ref Range Status  02/11/2014 0.98 0.60 - 1.30 mg/dL Final   Creatinine, Ser  Date Value Ref Range Status  09/10/2023 0.84 0.44 - 1.00 mg/dL Final         Failed - eGFR is 30 or above and within 180 days    EGFR (African American)  Date Value Ref Range Status  02/11/2014 >60  Final   GFR calc Af Amer  Date Value Ref Range Status  12/03/2018 >60 >60 mL/min Final   EGFR (Non-African Amer.)  Date Value Ref Range Status  02/11/2014 58 (L)  Final    Comment:    eGFR values <38mL/min/1.73 m2 may be an indication of chronic kidney disease (CKD). Calculated eGFR is useful in patients with stable renal function. The eGFR calculation will not be reliable in acutely ill patients when serum creatinine is changing rapidly. It is not useful in  patients on dialysis. The eGFR calculation may not be applicable to patients  at the low and high extremes of body sizes, pregnant women, and vegetarians.    GFR, Estimated  Date Value Ref Range Status  09/10/2023 >60 >60 mL/min Final    Comment:    (NOTE) Calculated using the CKD-EPI Creatinine Equation (2021)    eGFR  Date Value Ref Range Status   04/08/2023 83 >59 mL/min/1.73 Final         Passed - Patient is not pregnant      Passed - Last BP in normal range    BP Readings from Last 1 Encounters:  10/12/23 112/70         Passed - Valid encounter within last 6 months    Recent Outpatient Visits           5 months ago Essential hypertension   [place] [facility] at [facility], Nyoka Cowden, MD   11 months ago Essential hypertension   [place] [facility] at [facility], Nyoka Cowden, MD   1 year ago Essential hypertension   [place] [facility] at [facility], Nyoka Cowden, MD   1 year ago Essential hypertension   [place] [facility] at [facility], Nyoka Cowden, MD   1 year ago Essential hypertension   [place] [facility] at [facility], Nyoka Cowden, MD       Future Appointments             In 1 month Sondra Come, MD [facility] [place]             sertraline (ZOLOFT) 50 MG tablet 30 tablet 0    Sig: TAKE 1 TABLET BY MOUTH EVERYDAY AT BEDTIME     Psychiatry:  Antidepressants - SSRI - sertraline Passed - 03/12/2024  9:07 AM      Passed - AST in normal range and within 360 days    AST  Date Value Ref Range Status  09/10/2023 18 15 - 41 U/L Final   SGOT(AST)  Date Value Ref Range Status  01/29/2012 17 15 - 37 Unit/L Final         Passed - ALT in normal range and within 360 days    ALT  Date Value Ref Range Status  09/10/2023 11 0 - 44 U/L Final   SGPT (ALT)  Date Value Ref Range Status  01/29/2012 21 U/L Final    Comment:    12-78 NOTE: NEW REFERENCE RANGE 11/19/2011          Passed - Completed PHQ-2 or PHQ-9 in the last 360 days      Passed - Valid encounter within last 6 months    Recent Outpatient Visits           5 months ago Essential hypertension   [place] [facility] at [facility], Nyoka Cowden, MD   11 months ago Essential hypertension   [place] [facility] at [facility], Nyoka Cowden, MD   1 year ago Essential hypertension   [place] [facility] at [facility], Nyoka Cowden, MD   1 year ago Essential hypertension   [place] [facility] at [facility], Nyoka Cowden, MD   1 year ago Essential hypertension   [facility] at [facility], Vernona Rieger  H, MD       Future Appointments             In 1 month Sninsky, Laurette Schimke, MD [facility] [place]             simvastatin (ZOCOR) 20 MG tablet 30 tablet 0    Sig: Take 1 tablet (20 mg total) by mouth daily.     Cardiovascular:  Antilipid - Statins Failed - 03/12/2024  9:07 AM      Failed - Lipid Panel in normal range within the last 12 months    Cholesterol, Total  Date Value Ref Range Status  10/12/2023 122 100 - 199 mg/dL Final   LDL Chol Calc (NIH)  Date Value Ref Range Status  10/12/2023 52 0 - 99 mg/dL Final   HDL  Date Value Ref Range Status  10/12/2023 52 >39 mg/dL Final   Triglycerides  Date Value Ref Range Status  10/12/2023 92 0 - 149 mg/dL Final         Passed - Patient is not pregnant      Passed - Valid encounter within last 12 months    Recent Outpatient Visits           5 months ago Essential hypertension   [place] [facility] at [facility], Nyoka Cowden, MD   11 months ago Essential hypertension   [place] [facility] at [facility], Nyoka Cowden, MD   1 year ago Essential hypertension   [place] [facility] at [facility], Nyoka Cowden, MD   1 year ago Essential hypertension   [place] [facility] at [facility], Nyoka Cowden, MD   1 year ago Essential hypertension   [place] [facility]  at [facility], Nyoka Cowden, MD       Future Appointments             In 1 month Richardo Hanks, Laurette Schimke, MD [facility] [place]             traZODone (DESYREL) 50 MG tablet 30 tablet 0    Sig: TAKE 1 TABLET BY MOUTH EVERY DAY AT BEDTIME AS NEEDED FOR SLEEP     Psychiatry: Antidepressants - Serotonin Modulator Passed - 03/12/2024  9:07 AM      Passed - Valid encounter within last 6 months    Recent Outpatient Visits           5 months ago Essential hypertension   [place] [facility] at [facility], Nyoka Cowden, MD   11 months ago Essential hypertension   [place] [facility] at [facility], Nyoka Cowden, MD   1 year ago Essential hypertension   [place] [facility] at [facility], Nyoka Cowden, MD   1 year ago Essential hypertension   [place] [facility] at [facility], Nyoka Cowden, MD   1 year ago Essential hypertension    Primary Care & Sports Medicine at [facility], Nyoka Cowden, MD       Future Appointments             In 1 month Richardo Hanks, Laurette Schimke, MD [facility] [place]            Refused Prescriptions Disp Refills   omeprazole (  PRILOSEC) 40 MG capsule 90 capsule 1    Sig: Take 1 capsule (40 mg total) by mouth daily.     Gastroenterology: Proton Pump Inhibitors Passed - 03/12/2024  9:07 AM      Passed - Valid encounter within last 12 months    Recent Outpatient Visits           5 months ago Essential hypertension   [place] [facility] at [facility], Nyoka Cowden, MD   11 months ago Essential hypertension   [place] [facility] at [facility], Nyoka Cowden, MD   1 year ago Essential hypertension   [place] [facility] at [facility], Nyoka Cowden, MD   1 year ago Essential hypertension   [facility] at [facility], Nyoka Cowden, MD   1 year ago Essential hypertension   [facility] Sports [facility] at [facility], Nyoka Cowden, MD       Future Appointments             In 1 month Richardo Hanks, Laurette Schimke, MD [facility] [place]

## 2024-03-23 ENCOUNTER — Encounter: Admitting: Internal Medicine

## 2024-03-26 ENCOUNTER — Ambulatory Visit: Admitting: Internal Medicine

## 2024-05-08 ENCOUNTER — Ambulatory Visit: Payer: Self-pay | Admitting: Urology

## 2024-05-09 ENCOUNTER — Encounter: Payer: Self-pay | Admitting: Urology

## 2024-07-05 DIAGNOSIS — F0280 Dementia in other diseases classified elsewhere without behavioral disturbance: Secondary | ICD-10-CM | POA: Diagnosis not present

## 2024-07-05 DIAGNOSIS — Z1331 Encounter for screening for depression: Secondary | ICD-10-CM | POA: Diagnosis not present

## 2024-07-05 DIAGNOSIS — G309 Alzheimer's disease, unspecified: Secondary | ICD-10-CM | POA: Diagnosis not present

## 2024-10-12 DIAGNOSIS — Z1331 Encounter for screening for depression: Secondary | ICD-10-CM | POA: Diagnosis not present

## 2024-10-12 DIAGNOSIS — E1159 Type 2 diabetes mellitus with other circulatory complications: Secondary | ICD-10-CM | POA: Diagnosis not present

## 2024-12-10 NOTE — Progress Notes (Signed)
 Candice Cook                                          MRN: [id number]   12/10/2024   The VBCI Quality Team Specialist reviewed this patient medical record for the purposes of chart review for care gap closure. The following were reviewed: chart review for care gap closure-kidney health evaluation for diabetes:eGFR  and uACR.    VBCI Quality Team

## 2025-01-25 ENCOUNTER — Other Ambulatory Visit: Payer: Self-pay

## 2025-01-25 ENCOUNTER — Emergency Department

## 2025-01-25 ENCOUNTER — Emergency Department
Admission: EM | Admit: 2025-01-25 | Discharge: 2025-01-25 | Disposition: A | Discharge status: Home / Self Care | Attending: Emergency Medicine | Admitting: Emergency Medicine

## 2025-01-25 DIAGNOSIS — S299XXA Unspecified injury of thorax, initial encounter: Secondary | ICD-10-CM | POA: Diagnosis present

## 2025-01-25 DIAGNOSIS — W19XXXA Unspecified fall, initial encounter: Secondary | ICD-10-CM | POA: Insufficient documentation

## 2025-01-25 DIAGNOSIS — S0083XA Contusion of other part of head, initial encounter: Secondary | ICD-10-CM | POA: Diagnosis not present

## 2025-01-25 DIAGNOSIS — I10 Essential (primary) hypertension: Secondary | ICD-10-CM | POA: Diagnosis not present

## 2025-01-25 DIAGNOSIS — Y92002 Bathroom of unspecified non-institutional (private) residence single-family (private) house as the place of occurrence of the external cause: Secondary | ICD-10-CM | POA: Insufficient documentation

## 2025-01-25 DIAGNOSIS — S2231XA Fracture of one rib, right side, initial encounter for closed fracture: Secondary | ICD-10-CM | POA: Insufficient documentation

## 2025-01-25 DIAGNOSIS — E119 Type 2 diabetes mellitus without complications: Secondary | ICD-10-CM | POA: Insufficient documentation

## 2025-01-25 MED ORDER — LIDOCAINE 5 % EX PTCH
1.0000 | MEDICATED_PATCH | CUTANEOUS | Status: DC
  Administered 2025-01-25: 1 via TRANSDERMAL
  Filled 2025-01-25: qty 1

## 2025-01-25 MED ORDER — ACETAMINOPHEN 500 MG PO TABS
1000.0000 mg | ORAL_TABLET | Freq: Once | ORAL | Status: AC
  Administered 2025-01-25: 1000 mg via ORAL
  Filled 2025-01-25: qty 2

## 2025-01-25 MED ORDER — LIDOCAINE 5 % EX PTCH: 1.0000 | MEDICATED_PATCH | Freq: Two times a day (BID) | CUTANEOUS | 1 refills | Status: AC

## 2025-01-25 MED ORDER — METHOCARBAMOL 500 MG PO TABS: 500.0000 mg | ORAL_TABLET | Freq: Three times a day (TID) | ORAL | 0 refills | Status: AC | PRN

## 2025-01-25 MED ORDER — METHOCARBAMOL 500 MG PO TABS
500.0000 mg | ORAL_TABLET | Freq: Once | ORAL | Status: AC
  Administered 2025-01-25: 500 mg via ORAL
  Filled 2025-01-25: qty 1

## 2025-01-25 NOTE — Discharge Instructions (Addendum)
 Use Tylenol  for pain and fevers.  Up to 1000 mg per dose, up to 4 times per day.  Do not take more than 4000 mg of Tylenol /acetaminophen  within 24 hours..  Please use lidocaine  patches at your site of pain.  Apply 1 patch at a time, leave on for 12 hours, then remove for 12 hours.  12 hours on, 12 hours off.  Do not apply more than 1 patch at a time.  Use Robaxin  muscle relaxer as needed for more severe/breakthrough pain, up to 3 times per day. This medication can make some people sleepy, so do not use while driving, working or operating machinery Can cut this medication in half.   Keep the incentive spirometer (plastic breathing device) with you at all times and use at least every hour throughout the day to help take large breaths in and to prevent pneumonia.  If you have uncontrolled pain, difficulty breathing, fever/cough or other concerns then please return to the ED

## 2025-01-25 NOTE — ED Triage Notes (Signed)
 Pt arrives via POV after a fall yesterday evening while trying to get new underpants on. Pt is extremely tender to the touch on the right side of their trunk and has double black eyes. Pt is unsure what they hit. Pt denies LOC, blood thinners. Pt is A&Ox4 and ambulatory during triage.
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
## 2025-01-25 NOTE — ED Notes (Signed)
Pt educated on how to use incentive spirometer.
